# Patient Record
Sex: Female | Born: 1975 | Race: White | Hispanic: No | Marital: Single | Smoking: Never smoker
Health system: Southern US, Community
[De-identification: ages and names within clinical notes are randomized; demographics above are authoritative.]

## PROBLEM LIST (undated history)

## (undated) DIAGNOSIS — K760 Fatty (change of) liver, not elsewhere classified: Secondary | ICD-10-CM

## (undated) DIAGNOSIS — C801 Malignant (primary) neoplasm, unspecified: Secondary | ICD-10-CM

## (undated) DIAGNOSIS — M329 Systemic lupus erythematosus, unspecified: Secondary | ICD-10-CM

## (undated) DIAGNOSIS — R569 Unspecified convulsions: Secondary | ICD-10-CM

## (undated) DIAGNOSIS — M359 Systemic involvement of connective tissue, unspecified: Secondary | ICD-10-CM

## (undated) DIAGNOSIS — F10231 Alcohol dependence with withdrawal delirium: Secondary | ICD-10-CM

## (undated) DIAGNOSIS — N189 Chronic kidney disease, unspecified: Secondary | ICD-10-CM

## (undated) DIAGNOSIS — IMO0002 Reserved for concepts with insufficient information to code with codable children: Secondary | ICD-10-CM

## (undated) DIAGNOSIS — I7300 Raynaud's syndrome without gangrene: Secondary | ICD-10-CM

## (undated) DIAGNOSIS — F32A Depression, unspecified: Secondary | ICD-10-CM

## (undated) DIAGNOSIS — F1090 Alcohol use, unspecified, uncomplicated: Secondary | ICD-10-CM

## (undated) DIAGNOSIS — F329 Major depressive disorder, single episode, unspecified: Secondary | ICD-10-CM

## (undated) DIAGNOSIS — F10931 Alcohol use, unspecified with withdrawal delirium: Secondary | ICD-10-CM

## (undated) DIAGNOSIS — I10 Essential (primary) hypertension: Secondary | ICD-10-CM

## (undated) DIAGNOSIS — F419 Anxiety disorder, unspecified: Secondary | ICD-10-CM

## (undated) HISTORY — PX: BREAST ENHANCEMENT SURGERY: SHX7

---

## 2011-01-16 DIAGNOSIS — J45909 Unspecified asthma, uncomplicated: Secondary | ICD-10-CM | POA: Insufficient documentation

## 2011-01-16 DIAGNOSIS — F4321 Adjustment disorder with depressed mood: Secondary | ICD-10-CM | POA: Insufficient documentation

## 2011-01-16 DIAGNOSIS — IMO0002 Reserved for concepts with insufficient information to code with codable children: Secondary | ICD-10-CM | POA: Insufficient documentation

## 2011-01-16 DIAGNOSIS — F329 Major depressive disorder, single episode, unspecified: Secondary | ICD-10-CM | POA: Insufficient documentation

## 2011-10-27 DIAGNOSIS — Z9289 Personal history of other medical treatment: Secondary | ICD-10-CM | POA: Insufficient documentation

## 2013-03-03 DIAGNOSIS — C801 Malignant (primary) neoplasm, unspecified: Secondary | ICD-10-CM

## 2013-03-03 HISTORY — DX: Malignant (primary) neoplasm, unspecified: C80.1

## 2015-06-22 DIAGNOSIS — R748 Abnormal levels of other serum enzymes: Secondary | ICD-10-CM | POA: Insufficient documentation

## 2015-06-22 DIAGNOSIS — K760 Fatty (change of) liver, not elsewhere classified: Secondary | ICD-10-CM | POA: Insufficient documentation

## 2015-11-26 ENCOUNTER — Emergency Department
Admission: EM | Admit: 2015-11-26 | Discharge: 2015-11-27 | Disposition: A | Discharge status: Other Acute Inpatient Hospital | Payer: Self-pay | Attending: Emergency Medicine | Admitting: Emergency Medicine

## 2015-11-26 ENCOUNTER — Encounter: Payer: Self-pay | Admitting: *Deleted

## 2015-11-26 DIAGNOSIS — F329 Major depressive disorder, single episode, unspecified: Secondary | ICD-10-CM | POA: Insufficient documentation

## 2015-11-26 DIAGNOSIS — F1020 Alcohol dependence, uncomplicated: Secondary | ICD-10-CM

## 2015-11-26 DIAGNOSIS — R4586 Emotional lability: Secondary | ICD-10-CM

## 2015-11-26 DIAGNOSIS — F10120 Alcohol abuse with intoxication, uncomplicated: Secondary | ICD-10-CM | POA: Insufficient documentation

## 2015-11-26 DIAGNOSIS — F1994 Other psychoactive substance use, unspecified with psychoactive substance-induced mood disorder: Secondary | ICD-10-CM

## 2015-11-26 DIAGNOSIS — F10920 Alcohol use, unspecified with intoxication, uncomplicated: Secondary | ICD-10-CM

## 2015-11-26 DIAGNOSIS — R569 Unspecified convulsions: Secondary | ICD-10-CM

## 2015-11-26 DIAGNOSIS — E876 Hypokalemia: Secondary | ICD-10-CM

## 2015-11-26 DIAGNOSIS — I10 Essential (primary) hypertension: Secondary | ICD-10-CM | POA: Insufficient documentation

## 2015-11-26 DIAGNOSIS — M329 Systemic lupus erythematosus, unspecified: Secondary | ICD-10-CM

## 2015-11-26 DIAGNOSIS — F3489 Other specified persistent mood disorders: Secondary | ICD-10-CM | POA: Insufficient documentation

## 2015-11-26 HISTORY — DX: Unspecified convulsions: R56.9

## 2015-11-26 HISTORY — DX: Essential (primary) hypertension: I10

## 2015-11-26 HISTORY — DX: Anxiety disorder, unspecified: F41.9

## 2015-11-26 HISTORY — DX: Major depressive disorder, single episode, unspecified: F32.9

## 2015-11-26 HISTORY — DX: Systemic lupus erythematosus, unspecified: M32.9

## 2015-11-26 HISTORY — DX: Reserved for concepts with insufficient information to code with codable children: IMO0002

## 2015-11-26 HISTORY — DX: Depression, unspecified: F32.A

## 2015-11-26 LAB — COMPREHENSIVE METABOLIC PANEL
ALT: 119 U/L — ABNORMAL HIGH (ref 14–54)
ANION GAP: 10 (ref 5–15)
AST: 578 U/L — AB (ref 15–41)
Albumin: 4.2 g/dL (ref 3.5–5.0)
Alkaline Phosphatase: 118 U/L (ref 38–126)
BILIRUBIN TOTAL: 1.1 mg/dL (ref 0.3–1.2)
BUN: 9 mg/dL (ref 6–20)
CHLORIDE: 102 mmol/L (ref 101–111)
CO2: 26 mmol/L (ref 22–32)
Calcium: 8.4 mg/dL — ABNORMAL LOW (ref 8.9–10.3)
Creatinine, Ser: 0.66 mg/dL (ref 0.44–1.00)
Glucose, Bld: 155 mg/dL — ABNORMAL HIGH (ref 65–99)
POTASSIUM: 3.2 mmol/L — AB (ref 3.5–5.1)
Sodium: 138 mmol/L (ref 135–145)
TOTAL PROTEIN: 8.1 g/dL (ref 6.5–8.1)

## 2015-11-26 LAB — CBC
HCT: 43.4 % (ref 35.0–47.0)
Hemoglobin: 15.3 g/dL (ref 12.0–16.0)
MCH: 34.4 pg — ABNORMAL HIGH (ref 26.0–34.0)
MCHC: 35.2 g/dL (ref 32.0–36.0)
MCV: 97.6 fL (ref 80.0–100.0)
PLATELETS: 101 10*3/uL — AB (ref 150–440)
RBC: 4.45 MIL/uL (ref 3.80–5.20)
RDW: 13.1 % (ref 11.5–14.5)
WBC: 2.5 10*3/uL — AB (ref 3.6–11.0)

## 2015-11-26 LAB — ETHANOL: ALCOHOL ETHYL (B): 491 mg/dL — AB (ref <5)

## 2015-11-26 LAB — SALICYLATE LEVEL

## 2015-11-26 LAB — ACETAMINOPHEN LEVEL: Acetaminophen (Tylenol), Serum: <10 ug/mL — ABNORMAL LOW (ref 10–30)

## 2015-11-26 MED ORDER — POTASSIUM CHLORIDE CRYS ER 20 MEQ PO TBCR
40.0000 meq | EXTENDED_RELEASE_TABLET | Freq: Once | ORAL | Status: AC
  Administered 2015-11-26: 40 meq via ORAL
  Filled 2015-11-26: qty 2

## 2015-11-26 MED ORDER — LORAZEPAM 2 MG PO TABS: 0.0000 mg | ORAL_TABLET | Freq: Two times a day (BID) | ORAL | Status: DC

## 2015-11-26 MED ORDER — LORAZEPAM 2 MG PO TABS
0.0000 mg | ORAL_TABLET | Freq: Four times a day (QID) | ORAL | Status: DC
  Administered 2015-11-26: 2 mg via ORAL
  Filled 2015-11-26: qty 1

## 2015-11-26 MED ORDER — VITAMIN B-1 100 MG PO TABS
100.0000 mg | ORAL_TABLET | Freq: Every day | ORAL | Status: DC
  Administered 2015-11-26 – 2015-11-27 (×2): 100 mg via ORAL
  Filled 2015-11-26 (×3): qty 1

## 2015-11-26 MED ORDER — THIAMINE HCL 100 MG/ML IJ SOLN: 100.0000 mg | Freq: Every day | INTRAMUSCULAR | Status: DC

## 2015-11-26 NOTE — ED Notes (Signed)
PT  PUT  UNDER  IVC   PER   DR Mariea Clonts  INFORMED  RN  [facility]
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
## 2015-11-26 NOTE — ED Notes (Signed)
Pt came to doorway and reported she needed to go to the bathroom. Pt then walked back in the room as if to head to the toilet and fell on the ground. No injury noted and pt denies pain at this time. Sitter placed at bedside and safety mats placed around bed. Pt had monitor on to alarm if she attempts to leave bed.
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
## 2015-11-26 NOTE — ED Notes (Signed)
Spoke with Dr Karma Greaser regarding pt being more agitated and anxious at this time.  MD requested that CIWA be scored and pt be medicated per CIWA score.

## 2015-11-26 NOTE — ED Notes (Signed)
Notified Adele Barthel. Of ETOH 491 per lab

## 2015-11-26 NOTE — Consult Note (Signed)
[facility] Face-to-Face Psychiatry Consult   Reason for Consult:  Consult for this 40 year old woman brought into the emergency room by her family for multiple problem behaviors   lReferring Physician:  Karma Greaser Patient Identification: Chloe [name] MRN:  [id number] Principal Diagnosis: Substance induced mood disorder [facility]) Diagnosis:   Patient Active Problem List   Diagnosis Date Noted  . Alcohol abuse [F10.10] 11/26/2015  . Substance induced mood disorder ([place]) [F19.94] 11/26/2015  . Lupus ([place]) [M32.9] 11/26/2015  . Seizures ([place]) [R56.9] 11/26/2015    Total Time spent with patient: 1 hour  Subjective:   Chloe [name] is a 40 y.o. female patient admitted with  "I just lost my job".  HPI:  Patient interviewed. Chart reviewed. Spoke with her parents as well. This is a 37 year old woman brought by her family and to the emergency room with concerns about labile behavior, bizarre behavior, appearing to not take care of herself. There is commitment paperwork taken out by her boyfriend reporting that she has extreme mood swings, that she abuses prescription medicine and has been acting in an erratic manner. On interview the patient herself has no insight in particular into any kind of mental health problem. 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 Patient is grossly intoxicated and her history tends to be rambling and disorganized. She tells me that she has been having multiple seizures recently. This despite insisting that she is still taking her Keppra regularly. She says her sleep has been erratic recently. Her mood has been feeling up and down. She denies any suicidal or homicidal ideation. She says that she sometimes sees things but only when she is with her boyfriend. Patient did not bring up herself her own drinking. When I ask her how much she had been drinking recently she told me  that she hardly drinks at all and had only had one little drink in the last day. Patient has a blood alcohol level of 490 on presentation. This she said she did not feel intoxicated at all and had no insight into the fact that she been abusing alcohol. I ask her father whether they knew of any alcohol problem and he said that he did not know of 1 but wouldn't be surprised. Patient denies that she is abusing any other drugs. Says that she takes all of her other medicine as prescribed.  Social history: Patient reports she has a home by herself but has a long-term boyfriend she sees regularly. She seems to have a close relationship with her parents. 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 When I ask her why she lost her job all she would tell me is that she was "not communicating".  Medical history: Patient has a history of lupus. Also history of seizures that apparently just started about 10 years ago and have only accelerated recently. It doesn't sound like anyone has any suspicion that they could be related to her alcohol abuse although that would seem like the obvious thing tying everything together to me.  Substance abuse history: Patient insists that she does not abuse alcohol. She says that she only drinks alcohol when she is with her boyfriend. Denies that she abuses any other drugs. Never had any kind of substance abuse treatment in the past.  Past Psychiatric History:  Patient says she has  seen psychiatrists in the past. Can't be very specific about why. No history of inpatient hospitalization. No history of suicide attempts or violence.  Risk to Self: Suicidal Ideation: No Suicidal Intent: No Is patient at risk for suicide?: No Suicidal Plan?: No Access to Means: No What has been your use of drugs/alcohol within the last 12 months?: see labs Other Self Harm Risks: none known Intentional Self Injurious Behavior: None Risk to Others: Homicidal  Ideation: No Thoughts of Harm to Others: No Current Homicidal Intent: No Current Homicidal Plan: No Access to Homicidal Means: No History of harm to others?: Yes Assessment of Violence: In past 6-12 months Violent Behavior Description: fighting with BF Does patient have access to weapons?: No Criminal Charges Pending?: No Does patient have a court date: No Prior Inpatient Therapy: Prior Inpatient Therapy: No (3x, doesn't remember who) Prior Outpatient Therapy: Prior Outpatient Therapy: No Prior Therapy Dates:  (unk) Prior Therapy Facilty/Provider(s):  (unk) Reason for Treatment: depression Does patient have an ACCT team?: No Does patient have Intensive In-House Services?  : No Does patient have [facility] services? : No Does patient have P4CC services?: No  Past Medical History:  Past Medical History:  Diagnosis Date  . Anxiety   . Depression   . Hypertension   . Lupus ([place])   . Seizures ([place])     Past Surgical History:  Procedure Laterality Date  . BREAST ENHANCEMENT SURGERY     Family History: No family history on file. Family Psychiatric  History:  Not aware of any family history of mental health problems. Social History:  History  Alcohol Use  . Yes    Comment: occasional     History  Drug Use No    Social History   Social History  . Marital status: Single    Spouse name: N/A  . Number of children: N/A  . Years of education: N/A   Social History Main Topics  . Smoking status: Never Smoker  . Smokeless tobacco: Never Used  . Alcohol use Yes     Comment: occasional  . Drug use: No  . Sexual activity: Not Asked   Other Topics Concern  . None   Social History Narrative  . None   Additional Social History:    Allergies:  Not on File  Labs:  Results for orders placed or performed during the hospital encounter of 11/26/15 (from the past 48 hour(s))  Comprehensive metabolic panel     Status: Abnormal   Collection Time: 11/26/15 12:19 PM  Result  Value Ref Range   Sodium 138 135 - 145 mmol/L   Potassium 3.2 (L) 3.5 - 5.1 mmol/L   Chloride 102 101 - 111 mmol/L   CO2 26 22 - 32 mmol/L   Glucose, Bld 155 (H) 65 - 99 mg/dL   BUN 9 6 - 20 mg/dL   Creatinine, Ser 0.66 0.44 - 1.00 mg/dL   Calcium 8.4 (L) 8.9 - 10.3 mg/dL   Total Protein 8.1 6.5 - 8.1 g/dL   Albumin 4.2 3.5 - 5.0 g/dL   AST 578 (H) 15 - 41 U/L   ALT 119 (H) 14 - 54 U/L   Alkaline Phosphatase 118 38 - 126 U/L   Total Bilirubin 1.1 0.3 - 1.2 mg/dL   GFR calc non Af Amer >60 >60 mL/min   GFR calc Af Amer >60 >60 mL/min    Comment: (NOTE) The eGFR has been calculated using the CKD EPI equation. This calculation has not been validated  in all clinical situations. eGFR's persistently <60 mL/min signify possible Chronic Kidney Disease.    Anion gap 10 5 - 15  Ethanol     Status: Abnormal   Collection Time: 11/26/15 12:19 PM  Result Value Ref Range   Alcohol, Ethyl (B) 491 (HH) <5 mg/dL    Comment: CRITICAL RESULT CALLED TO, READ BACK BY AND VERIFIED WITH SHANNON HATCH ON 11/26/15 AT 1320 QSD        LOWEST DETECTABLE LIMIT FOR SERUM ALCOHOL IS 5 mg/dL FOR MEDICAL PURPOSES ONLY   Salicylate level     Status: None   Collection Time: 11/26/15 12:19 PM  Result Value Ref Range   Salicylate Lvl <2.3 2.8 - 30.0 mg/dL  Acetaminophen level     Status: Abnormal   Collection Time: 11/26/15 12:19 PM  Result Value Ref Range   Acetaminophen (Tylenol), Serum <10 (L) 10 - 30 ug/mL    Comment:        THERAPEUTIC CONCENTRATIONS VARY SIGNIFICANTLY. A RANGE OF 10-30 ug/mL MAY BE AN EFFECTIVE CONCENTRATION FOR MANY PATIENTS. HOWEVER, SOME ARE BEST TREATED AT CONCENTRATIONS OUTSIDE THIS RANGE. ACETAMINOPHEN CONCENTRATIONS >150 ug/mL AT 4 HOURS AFTER INGESTION AND >50 ug/mL AT 12 HOURS AFTER INGESTION ARE OFTEN ASSOCIATED WITH TOXIC REACTIONS.   cbc     Status: Abnormal   Collection Time: 11/26/15 12:19 PM  Result Value Ref Range   WBC 2.5 (L) 3.6 - 11.0 K/uL   RBC 4.45  3.80 - 5.20 MIL/uL   Hemoglobin 15.3 12.0 - 16.0 g/dL   HCT 43.4 35.0 - 47.0 %   MCV 97.6 80.0 - 100.0 fL   MCH 34.4 (H) 26.0 - 34.0 pg   MCHC 35.2 32.0 - 36.0 g/dL   RDW 13.1 11.5 - 14.5 %   Platelets 101 (L) 150 - 440 K/uL    Current Facility-Administered Medications  Medication Dose Route Frequency Provider Last Rate Last Dose  . LORazepam (ATIVAN) tablet 0-4 mg  0-4 mg Oral Q6H Hinda Kehr, MD       Followed by  . [START ON 11/28/2015] LORazepam (ATIVAN) tablet 0-4 mg  0-4 mg Oral Q12H Hinda Kehr, MD      . thiamine (VITAMIN B-1) tablet 100 mg  100 mg Oral Daily Hinda Kehr, MD   100 mg at 11/26/15 1645   Or  . thiamine (B-1) injection 100 mg  100 mg Intravenous Daily Hinda Kehr, MD       No current outpatient prescriptions on file.    Musculoskeletal: Strength & Muscle Tone: decreased Gait & Station: unsteady Patient leans: Front  Psychiatric Specialty Exam: Physical Exam  Nursing note and vitals reviewed. Constitutional: She appears well-developed and well-nourished.  HENT:  Head: Normocephalic and atraumatic.  Eyes: Conjunctivae are normal. Pupils are equal, round, and reactive to light.  Neck: Normal range of motion.  Cardiovascular: Normal heart sounds.   Respiratory: Effort normal. No respiratory distress.  GI: Soft.  Musculoskeletal: Normal range of motion.  Neurological: She is alert.  Skin: Skin is warm and dry.  Psychiatric: Her affect is labile and inappropriate. Her speech is delayed, tangential and slurred. Thought content is not paranoid and not delusional. She expresses impulsivity and inappropriate judgment. She expresses no homicidal and no suicidal ideation. She exhibits abnormal recent memory and abnormal remote memory. She is inattentive.    Review of Systems  Constitutional: Positive for malaise/fatigue.  HENT: Negative.   Eyes: Negative.   Respiratory: Negative.   Cardiovascular: Negative.   Gastrointestinal: Positive  for nausea.   Musculoskeletal: Negative.   Skin: Negative.   Neurological: Positive for dizziness, seizures and weakness.  Psychiatric/Behavioral: Positive for hallucinations, memory loss and substance abuse. Negative for depression and suicidal ideas. The patient is nervous/anxious and has insomnia.     Blood pressure (!) 149/108, pulse 97, temperature 98.4 F (36.9 C), temperature source Oral, resp. rate 18, height 5' 2"  (1.575 m), weight 59 kg (130 lb), last menstrual period 09/25/2015, SpO2 96 %.Body mass index is 23.78 kg/m.  General Appearance: Disheveled  Eye Contact:  Fair  Speech:  Garbled, Slow and Slurred  Volume:  Decreased  Mood:  Euthymic  Affect:  Inappropriate and Labile  Thought Process:  Disorganized  Orientation:  Negative  Thought Content:  Illogical, Rumination and Tangential  Suicidal Thoughts:  No  Homicidal Thoughts:  No  Memory:  Immediate;   Fair Recent;   Poor Remote;   Fair  Judgement:  Impaired  Insight:  Lacking  Psychomotor Activity:  Decreased  Concentration:  Concentration: Poor  Recall:  [employer] of Knowledge:  Fair  Language:  Fair  Akathisia:  No  Handed:  Right  AIMS (if indicated):     Assets:  Housing Social Support  ADL's:  Impaired  Cognition:  Impaired,  Mild  Sleep:        Treatment Plan Summary: Daily contact with patient to assess and evaluate symptoms and progress in treatment, Medication management and Plan 40 year old woman who presents to the emergency room very intoxicated although somehow this seems to of escape the attention of her family. Patient herself does not understand that she is intoxicated. She is confused and her short-term memory is very bad. Disorganized in her thinking. Sounds like she is probably having seizures at least in part related to alcohol withdrawal. No indication of suicidality or psychosis. Patient does not require inpatient psychiatric hospitalization. I have discussed the situation with nursing who is going  to watch her closely for a CIWA protocol. I will follow-up as needed.  Disposition: Patient does not meet criteria for psychiatric inpatient admission. Supportive therapy provided about ongoing stressors.  Alethia Berthold, MD 11/26/2015 6:45 PM

## 2015-11-26 NOTE — ED Provider Notes (Signed)
-----------------------------------------   10:37 PM on 11/26/2015 -----------------------------------------  A few minutes ago the patient was briefly unobserved extremity get out of bed and fell forward, initially thought to strike her face but most likely caught herself on her arms.  I examined her and she has no acute injuries to her face or head, no C-spine tenderness, full range of motion without any pain or tenderness of her head and neck, no epistaxis, no apparent dental injuries.  She is complaining of a minimal amount of pain to her left elbow but she has no tenderness to palpation and full range of motion of the left elbow.  There is no indication for any imaging at this time.  A bed alarm is being put in place and she is being observed carefully.   Hinda Kehr, MD 11/26/15 2238

## 2015-11-26 NOTE — ED Notes (Signed)
Patient is refusing to stay, becoming mildly belligerent, but then becomes overly apologetic. Patient's father has contacted the patient's mother who is going to the magistrate to take out IVC papers.

## 2015-11-26 NOTE — ED Notes (Signed)
PT family has left but they are aware of visitation policy.

## 2015-11-26 NOTE — ED Notes (Signed)
PT IVC PENDING CONSULT  

## 2015-11-27 ENCOUNTER — Inpatient Hospital Stay
Admission: RE | Admit: 2015-11-27 | Discharge: 2015-11-30 | DRG: 882 | Disposition: A | Discharge status: Home / Self Care | Payer: No Typology Code available for payment source | Source: Intra-hospital | Attending: Psychiatry | Admitting: Psychiatry

## 2015-11-27 DIAGNOSIS — F329 Major depressive disorder, single episode, unspecified: Secondary | ICD-10-CM | POA: Diagnosis present

## 2015-11-27 DIAGNOSIS — F10231 Alcohol dependence with withdrawal delirium: Secondary | ICD-10-CM | POA: Diagnosis present

## 2015-11-27 DIAGNOSIS — R569 Unspecified convulsions: Secondary | ICD-10-CM | POA: Diagnosis present

## 2015-11-27 DIAGNOSIS — Z598 Other problems related to housing and economic circumstances: Secondary | ICD-10-CM

## 2015-11-27 DIAGNOSIS — M329 Systemic lupus erythematosus, unspecified: Secondary | ICD-10-CM | POA: Diagnosis present

## 2015-11-27 DIAGNOSIS — E039 Hypothyroidism, unspecified: Secondary | ICD-10-CM | POA: Diagnosis present

## 2015-11-27 DIAGNOSIS — I10 Essential (primary) hypertension: Secondary | ICD-10-CM | POA: Diagnosis present

## 2015-11-27 DIAGNOSIS — Z818 Family history of other mental and behavioral disorders: Secondary | ICD-10-CM | POA: Diagnosis not present

## 2015-11-27 DIAGNOSIS — G4700 Insomnia, unspecified: Secondary | ICD-10-CM | POA: Diagnosis present

## 2015-11-27 DIAGNOSIS — F10931 Alcohol use, unspecified with withdrawal delirium: Secondary | ICD-10-CM | POA: Diagnosis present

## 2015-11-27 DIAGNOSIS — F4323 Adjustment disorder with mixed anxiety and depressed mood: Principal | ICD-10-CM

## 2015-11-27 DIAGNOSIS — G319 Degenerative disease of nervous system, unspecified: Secondary | ICD-10-CM | POA: Diagnosis present

## 2015-11-27 DIAGNOSIS — F418 Other specified anxiety disorders: Secondary | ICD-10-CM | POA: Diagnosis present

## 2015-11-27 DIAGNOSIS — F1994 Other psychoactive substance use, unspecified with psychoactive substance-induced mood disorder: Secondary | ICD-10-CM | POA: Diagnosis present

## 2015-11-27 DIAGNOSIS — Y908 Blood alcohol level of 240 mg/100 ml or more: Secondary | ICD-10-CM | POA: Diagnosis present

## 2015-11-27 DIAGNOSIS — F1020 Alcohol dependence, uncomplicated: Secondary | ICD-10-CM | POA: Diagnosis present

## 2015-11-27 MED ORDER — LEVETIRACETAM 500 MG PO TABS
500.0000 mg | ORAL_TABLET | Freq: Two times a day (BID) | ORAL | Status: DC
  Administered 2015-11-27 – 2015-11-30 (×6): 500 mg via ORAL
  Filled 2015-11-27 (×6): qty 1

## 2015-11-27 MED ORDER — ACETAMINOPHEN 325 MG PO TABS
650.0000 mg | ORAL_TABLET | Freq: Four times a day (QID) | ORAL | Status: DC | PRN
Start: 2015-11-27 — End: 2015-11-30
  Administered 2015-11-28 – 2015-11-30 (×3): 650 mg via ORAL
  Filled 2015-11-27 (×3): qty 2

## 2015-11-27 MED ORDER — LORAZEPAM 1 MG PO TABS
1.0000 mg | ORAL_TABLET | Freq: Four times a day (QID) | ORAL | Status: DC | PRN
  Administered 2015-11-27 – 2015-11-30 (×7): 1 mg via ORAL
  Filled 2015-11-27 (×8): qty 1

## 2015-11-27 MED ORDER — THIAMINE HCL 100 MG/ML IJ SOLN: 100.0000 mg | Freq: Every day | INTRAMUSCULAR | Status: DC

## 2015-11-27 MED ORDER — ALUM & MAG HYDROXIDE-SIMETH 200-200-20 MG/5ML PO SUSP: 30.0000 mL | ORAL | Status: DC | PRN

## 2015-11-27 MED ORDER — LORAZEPAM 2 MG/ML IJ SOLN: 1.0000 mg | Freq: Four times a day (QID) | INTRAMUSCULAR | Status: DC | PRN

## 2015-11-27 MED ORDER — QUETIAPINE FUMARATE ER 50 MG PO TB24: 50.0000 mg | ORAL_TABLET | Freq: Every day | ORAL | Status: DC

## 2015-11-27 MED ORDER — MAGNESIUM HYDROXIDE 400 MG/5ML PO SUSP: 30.0000 mL | Freq: Every day | ORAL | Status: DC | PRN

## 2015-11-27 MED ORDER — DULOXETINE HCL 30 MG PO CPEP
30.0000 mg | ORAL_CAPSULE | Freq: Every day | ORAL | Status: DC
  Administered 2015-11-27 – 2015-11-29 (×3): 30 mg via ORAL
  Filled 2015-11-27 (×3): qty 1

## 2015-11-27 MED ORDER — HYDROXYCHLOROQUINE SULFATE 200 MG PO TABS
200.0000 mg | ORAL_TABLET | Freq: Every day | ORAL | Status: DC
  Administered 2015-11-27 – 2015-11-30 (×4): 200 mg via ORAL
  Filled 2015-11-27 (×5): qty 1

## 2015-11-27 MED ORDER — ADULT MULTIVITAMIN W/MINERALS CH
1.0000 | ORAL_TABLET | Freq: Every day | ORAL | Status: DC
  Administered 2015-11-27 – 2015-11-30 (×2): 1 via ORAL
  Filled 2015-11-27 (×4): qty 1

## 2015-11-27 MED ORDER — VITAMIN B-1 100 MG PO TABS
100.0000 mg | ORAL_TABLET | Freq: Every day | ORAL | Status: DC
  Administered 2015-11-27 – 2015-11-30 (×4): 100 mg via ORAL
  Filled 2015-11-27 (×4): qty 1

## 2015-11-27 MED ORDER — FOLIC ACID 1 MG PO TABS
1.0000 mg | ORAL_TABLET | Freq: Every day | ORAL | Status: DC
  Administered 2015-11-27 – 2015-11-30 (×4): 1 mg via ORAL
  Filled 2015-11-27 (×4): qty 1

## 2015-11-27 MED ORDER — LEVOTHYROXINE SODIUM 25 MCG PO TABS
25.0000 ug | ORAL_TABLET | Freq: Every day | ORAL | Status: DC
  Administered 2015-11-28 – 2015-11-30 (×3): 25 ug via ORAL
  Filled 2015-11-27 (×3): qty 1

## 2015-11-27 NOTE — ED Notes (Signed)
Dr.Clapacs at bedside  

## 2015-11-27 NOTE — ED Notes (Signed)

## 2015-11-27 NOTE — ED Notes (Signed)
Pt walking around in room, requesting tooth brush. Pt alert/oriented at this time.

## 2015-11-27 NOTE — Progress Notes (Signed)
Patient woke up stating she felt weird and shaking. Visual tremors. Endorsed nausea. CIWA at an 8. BP elevated. Ativan 1mg  PRN administered.

## 2015-11-27 NOTE — BHH Counselor (Signed)
TTS spoke with [facility] RN to inquire unit of disposition for this pt to be admitted downstairs to unit. This RN informed TTS she would call back to provide a bed assignment.

## 2015-11-27 NOTE — ED Notes (Signed)
BEHAVIORAL HEALTH ROUNDING Patient sleeping: Yes.   Patient alert and oriented: not applicable Behavior appropriate: Yes.  ; If no, describe:  Nutrition and fluids offered: No Toileting and hygiene offered: No Sitter present: yes Law enforcement present: Yes ODS  

## 2015-11-27 NOTE — Tx Team (Signed)
Initial Treatment Plan 11/27/2015 4:02 PM [name] [id number]    PATIENT STRESSORS: Occupational concerns Substance abuse   PATIENT STRENGTHS: Average or above average intelligence Capable of independent living Communication skills Supportive family/friends Work skills   PATIENT IDENTIFIED PROBLEMS: Substance abuse mood disorder 11/27/2015  Seizures disorder 11/27/2015                   DISCHARGE CRITERIA:  Adequate post-discharge living arrangements Verbal commitment to aftercare and medication compliance Withdrawal symptoms are absent or subacute and managed without 24-hour nursing intervention  PRELIMINARY DISCHARGE PLAN: Attend aftercare/continuing care group Return to previous living arrangement  PATIENT/FAMILY INVOLVEMENT: This treatment plan has been presented to and reviewed with the patient, Chloe [name], and/or family member,   The patient and family have been given the opportunity to ask questions and make suggestions.  Merlene Morse, RN 11/27/2015, 4:02 PM

## 2015-11-27 NOTE — Progress Notes (Signed)
Patient pleasant and cooperative during admission assessment. Patient denies SI/HI at this time. Patient denies AVH. Patient informed of fall risk status, fall risk assessed "low" at this time. Patient oriented to unit/staff/room. Patient denies any questions/concerns at this time. Patient safe on unit with Q15 minute checks for safety. Skin assessment & body search done.No contraband found. 

## 2015-11-27 NOTE — ED Notes (Signed)
Lunch given to patient.

## 2015-11-27 NOTE — BHH Counselor (Signed)
TTS spoke with Meredith Mody, RN in [place].   Pt has bed assignment 303-A.   Admitting: Dr. Weber Cooks  Attending: Dr. Bary Leriche

## 2015-11-27 NOTE — ED Notes (Signed)
Breakfast was given to patrient

## 2015-11-27 NOTE — ED Notes (Signed)
Report to [place], [place]

## 2015-11-27 NOTE — ED Notes (Signed)
Patient belongings sent home with mother.

## 2015-11-27 NOTE — ED Notes (Addendum)
Pt on phone with father, given tray and sprite.

## 2015-11-27 NOTE — BHH Group Notes (Signed)
[place] LCSW Group Therapy   11/27/2015 1pm  Type of Therapy: Group Therapy   Participation Level: Pt invited but did not attend.   Participation Quality: Pt invited but did not attend.    Glorious Peach, MSW, LCSWA 11/27/2015, 2:18PM

## 2015-11-27 NOTE — ED Notes (Signed)
Pt escorted to lower level unit by ED tech Beverlee Nims and [employer].

## 2015-11-27 NOTE — ED Provider Notes (Signed)
-----------------------------------------   1:59 PM on 11/27/2015 -----------------------------------------  Patient has been seen and evaluate by psychiatry, and they'll admit to their service for further treatment.   Harvest Dark, MD 11/27/15 1359

## 2015-11-27 NOTE — ED Provider Notes (Signed)
-----------------------------------------   7:30 AM on 11/27/2015 -----------------------------------------   Blood pressure 104/77, pulse 72, temperature 98.4 F (36.9 C), temperature source Oral, resp. rate 15, height 5\' 2"  (1.575 m), weight 130 lb (59 kg), last menstrual period 09/25/2015, SpO2 94 %.  The patient had no acute events since last update.  Calm and cooperative at this time.  Disposition is pending Psychiatry/Behavioral Medicine team recommendations.     Loney Hering, MD 11/27/15 0730

## 2015-11-27 NOTE — BHH Suicide Risk Assessment (Signed)
[facility] Admission Suicide Risk Assessment   Nursing information obtained from:    Demographic factors:    Current Mental Status:    Loss Factors:    Historical Factors:    Risk Reduction Factors:     Total Time spent with patient: 1 hour Principal Problem: Adjustment disorder with mixed anxiety and depressed mood Diagnosis:   Patient Active Problem List   Diagnosis Date Noted  . Adjustment disorder with mixed anxiety and depressed mood [F43.23] 11/27/2015  . Alcohol use disorder, moderate, dependence ([place]) [F10.20] 11/26/2015  . Substance induced mood disorder ([place]) [F19.94] 11/26/2015  . Lupus ([place]) [M32.9] 11/26/2015  . Seizures ([place]) [R56.9] 11/26/2015   Subjective Data: Depression.  Continued Clinical Symptoms:  Alcohol Use Disorder Identification Test Final Score (AUDIT): 3 The "Alcohol Use Disorders Identification Test", Guidelines for Use in Primary Care, Second Edition.  World Pharmacologist [place]). Score between 0-7:  no or low risk or alcohol related problems. Score between 8-15:  moderate risk of alcohol related problems. Score between 16-19:  high risk of alcohol related problems. Score 20 or above:  warrants further diagnostic evaluation for alcohol dependence and treatment.   CLINICAL FACTORS:   Depression:   Comorbid alcohol abuse/dependence Impulsivity Alcohol/Substance Abuse/Dependencies Medical Diagnoses and Treatments/Surgeries   Musculoskeletal: Strength & Muscle Tone: within normal limits Gait & Station: normal Patient leans: N/A  Psychiatric Specialty Exam: Physical Exam  Nursing note and vitals reviewed.   Review of Systems  Psychiatric/Behavioral: Positive for depression and substance abuse. The patient is nervous/anxious.   All other systems reviewed and are negative.   Blood pressure (!) 140/103, pulse (!) 102, temperature 98.7 F (37.1 C), temperature source Oral, resp. rate 18, height 5' 2.5" (1.588 m), weight 56.2 kg (124 lb), last  menstrual period 09/25/2015, SpO2 98 %.Body mass index is 22.32 kg/m.  General Appearance: Casual  Eye Contact:  Good  Speech:  Clear and Coherent  Volume:  Normal  Mood:  Anxious  Affect:  Appropriate  Thought Process:  Goal Directed  Orientation:  Full (Time, Place, and Person)  Thought Content:  WDL  Suicidal Thoughts:  No  Homicidal Thoughts:  No  Memory:  Immediate;   Fair Recent;   Fair Remote;   Fair  Judgement:  Impaired  Insight:  Lacking  Psychomotor Activity:  Normal  Concentration:  Concentration: Fair and Attention Span: Fair  Recall:  [employer] of Knowledge:  Fair  Language:  Fair  Akathisia:  No  Handed:  Right  AIMS (if indicated):     Assets:  Communication Skills Desire for Improvement Housing Resilience Social Support Transportation  ADL's:  Intact  Cognition:  WNL  Sleep:         COGNITIVE FEATURES THAT CONTRIBUTE TO RISK:  None    SUICIDE RISK:   Minimal: No identifiable suicidal ideation.  Patients presenting with no risk factors but with morbid ruminations; may be classified as minimal risk based on the severity of the depressive symptoms   PLAN OF CARE: Hospital admission, medication management, substance abuse counseling, discharge planning.  Ms. Choma is a 40 year old female with history of lupus, seizures, and depression admitted for disorganized in the context of alcohol abuse.  1. Mood. The patient responded well to Cymbalta in the past.  2. Alcohol abuse. Blood alcohol level was 490 admission. The patient denies drinking recently. She got drunk on the day of admission. She does not believe that she treatment for substance abuse.  3. Lupus. She is  on Plaquenil.  4. Seizures. She is on Keppra.  5. Hypothyroidism. She is on Synthroid.  6. Alcohol abuse. She is on CIWA protocol.  7. Disposition. She will be discharged to home with family. Follow up with [facility].  I certify that inpatient services furnished can reasonably be  expected to improve the patient's condition.  Orson Slick, MD 11/27/2015, 5:08 PM

## 2015-11-27 NOTE — ED Notes (Signed)
BEHAVIORAL HEALTH ROUNDING  Patient sleeping: Yes Patient alert and oriented: Sleeping Behavior appropriate: Yes. ; If no, describe:  Nutrition and fluids offered: No, sleeping  Toileting and hygiene offered: No, sleeping  Sitter present: q15 minute observations and security monitoring  Law enforcement present: Yes ODS 

## 2015-11-27 NOTE — ED Notes (Signed)
Report from [place], Therapist, sports. Care assumed by this RN.

## 2015-11-27 NOTE — Consult Note (Signed)
[facility] Face-to-Face Psychiatry Consult   Reason for Consult:  Consult for this 40 year old woman brought into the emergency room by her family for multiple problem behaviors   lReferring Physician:  Karma Greaser Patient Identification: Chloe [name] MRN:  [id number] Principal Diagnosis: Substance induced mood disorder [facility]) Diagnosis:   Patient Active Problem List   Diagnosis Date Noted  . Alcohol abuse [F10.10] 11/26/2015  . Substance induced mood disorder ([place]) [F19.94] 11/26/2015  . Lupus ([place]) [M32.9] 11/26/2015  . Seizures ([place]) [R56.9] 11/26/2015    Total Time spent with patient: 20 minutes  Subjective:   Chloe [name] is a 40 y.o. female patient admitted with  "I just lost my job".  Follow-up Tuesday the 26th. Patient reevaluated. I also spoke to her boyfriend on the telephone with the patient's consent. On interview today the patient initially was a little confused about where she was but was able to resolve that rapidly but still not able to give a clear description of the reason for being in the hospital. She repeated her statements from yesterday that she did not think that she was drinking a significant amount. Denied any acute mood symptoms. Seem to still be a little evasive and possibly not very forthcoming about her recent history. She did specifically tell me that I should call her boyfriend. I spoke to him by telephone. He described events going back to July of this year with multiple detailed episodes of her having very rapid shifts in her mood and behavior with agitated confused behavior that would seem to calm out of nowhere, frequent episodes of confusion, multiple episodes of these supposedly "seizures" which by his description sound questionable for actual epileptic seizures.  HPI:  Patient interviewed. Chart reviewed. Spoke with her parents as well. This is a 40 year old woman brought by her family and to the emergency room with concerns about labile behavior, bizarre behavior,  appearing to not take care of herself. There is commitment paperwork taken out by her boyfriend reporting that she has extreme mood swings, that she abuses prescription medicine and has been acting in an erratic manner. On interview the patient herself has no insight in particular into any kind of mental health problem. 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 Patient is grossly intoxicated and her history tends to be rambling and disorganized. She tells me that she has been having multiple seizures recently. This despite insisting that she is still taking her Keppra regularly. She says her sleep has been erratic recently. Her mood has been feeling up and down. She denies any suicidal or homicidal ideation. She says that she sometimes sees things but only when she is with her boyfriend. Patient did not bring up herself her own drinking. When I ask her how much she had been drinking recently she told me that she hardly drinks at all and had only had one little drink in the last day. Patient has a blood alcohol level of 490 on presentation. This she said she did not feel intoxicated at all and had no insight into the fact that she been abusing alcohol. I ask her father whether they knew of any alcohol problem and he said that he did not know of 1 but wouldn't be surprised. Patient denies that she is abusing any other drugs. Says that she takes all of her other medicine as prescribed.  Social history:  Patient reports she has a home by herself but has a long-term boyfriend she sees regularly. She seems to have a close relationship with her parents. 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 When I ask her why she lost her job all she would tell me is that she was "not communicating".  Medical history: Patient has a history of lupus. Also history of  seizures that apparently just started about 10 years ago and have only accelerated recently. It doesn't sound like anyone has any suspicion that they could be related to her alcohol abuse although that would seem like the obvious thing tying everything together to me.  Substance abuse history: Patient insists that she does not abuse alcohol. She says that she only drinks alcohol when she is with her boyfriend. Denies that she abuses any other drugs. Never had any kind of substance abuse treatment in the past.  Past Psychiatric History:  Patient says she has seen psychiatrists in the past. Can't be very specific about why. No history of inpatient hospitalization. No history of suicide attempts or violence.  Risk to Self:   Risk to Others:   Prior Inpatient Therapy:   Prior Outpatient Therapy:    Past Medical History:  Past Medical History:  Diagnosis Date  . Anxiety   . Depression   . Hypertension   . Lupus ([place])   . Seizures ([place])     Past Surgical History:  Procedure Laterality Date  . BREAST ENHANCEMENT SURGERY     Family History: No family history on file. Family Psychiatric  History:  Not aware of any family history of mental health problems. Social History:  History  Alcohol Use  . Yes    Comment: occasional     History  Drug Use No    Social History   Social History  . Marital status: Single    Spouse name: N/A  . Number of children: N/A  . Years of education: N/A   Social History Main Topics  . Smoking status: Never Smoker  . Smokeless tobacco: Never Used  . Alcohol use Yes     Comment: occasional  . Drug use: No  . Sexual activity: Not on file   Other Topics Concern  . Not on file   Social History Narrative  . No narrative on file   Additional Social History:    Allergies:  Not on File  Labs:  Results for orders placed or performed during the hospital encounter of 11/26/15 (from the past 48 hour(s))  Comprehensive metabolic panel     Status:  Abnormal   Collection Time: 11/26/15 12:19 PM  Result Value Ref Range   Sodium 138 135 - 145 mmol/L   Potassium 3.2 (L) 3.5 - 5.1 mmol/L   Chloride 102 101 - 111 mmol/L   CO2 26 22 - 32 mmol/L   Glucose, Bld 155 (H) 65 - 99 mg/dL   BUN 9 6 - 20 mg/dL   Creatinine, Ser 0.66 0.44 - 1.00 mg/dL   Calcium 8.4 (L) 8.9 - 10.3 mg/dL   Total Protein 8.1 6.5 - 8.1 g/dL   Albumin 4.2 3.5 - 5.0 g/dL   AST 578 (H) 15 - 41 U/L   ALT 119 (H) 14 - 54 U/L   Alkaline Phosphatase 118 38 - 126 U/L   Total Bilirubin 1.1 0.3 - 1.2 mg/dL   GFR calc non Af Amer >60 >60 mL/min   GFR calc  Af Amer >60 >60 mL/min    Comment: (NOTE) The eGFR has been calculated using the CKD EPI equation. This calculation has not been validated in all clinical situations. eGFR's persistently <60 mL/min signify possible Chronic Kidney Disease.    Anion gap 10 5 - 15  Ethanol     Status: Abnormal   Collection Time: 11/26/15 12:19 PM  Result Value Ref Range   Alcohol, Ethyl (B) 491 (HH) <5 mg/dL    Comment: CRITICAL RESULT CALLED TO, READ BACK BY AND VERIFIED WITH SHANNON HATCH ON 11/26/15 AT 1320 QSD        LOWEST DETECTABLE LIMIT FOR SERUM ALCOHOL IS 5 mg/dL FOR MEDICAL PURPOSES ONLY   Salicylate level     Status: None   Collection Time: 11/26/15 12:19 PM  Result Value Ref Range   Salicylate Lvl <0.1 2.8 - 30.0 mg/dL  Acetaminophen level     Status: Abnormal   Collection Time: 11/26/15 12:19 PM  Result Value Ref Range   Acetaminophen (Tylenol), Serum <10 (L) 10 - 30 ug/mL    Comment:        THERAPEUTIC CONCENTRATIONS VARY SIGNIFICANTLY. A RANGE OF 10-30 ug/mL MAY BE AN EFFECTIVE CONCENTRATION FOR MANY PATIENTS. HOWEVER, SOME ARE BEST TREATED AT CONCENTRATIONS OUTSIDE THIS RANGE. ACETAMINOPHEN CONCENTRATIONS >150 ug/mL AT 4 HOURS AFTER INGESTION AND >50 ug/mL AT 12 HOURS AFTER INGESTION ARE OFTEN ASSOCIATED WITH TOXIC REACTIONS.   cbc     Status: Abnormal   Collection Time: 11/26/15 12:19 PM  Result  Value Ref Range   WBC 2.5 (L) 3.6 - 11.0 K/uL   RBC 4.45 3.80 - 5.20 MIL/uL   Hemoglobin 15.3 12.0 - 16.0 g/dL   HCT 43.4 35.0 - 47.0 %   MCV 97.6 80.0 - 100.0 fL   MCH 34.4 (H) 26.0 - 34.0 pg   MCHC 35.2 32.0 - 36.0 g/dL   RDW 13.1 11.5 - 14.5 %   Platelets 101 (L) 150 - 440 K/uL    No current facility-administered medications for this encounter.     Musculoskeletal: Strength & Muscle Tone: decreased Gait & Station: unsteady Patient leans: Front  Psychiatric Specialty Exam: Physical Exam  Nursing note and vitals reviewed. Constitutional: She appears well-developed and well-nourished.  HENT:  Head: Normocephalic and atraumatic.  Eyes: Conjunctivae are normal. Pupils are equal, round, and reactive to light.  Neck: Normal range of motion.  Cardiovascular: Normal heart sounds.   Respiratory: Effort normal. No respiratory distress.  GI: Soft.  Musculoskeletal: Normal range of motion.  Neurological: She is alert.  Skin: Skin is warm and dry.  Psychiatric: Her mood appears anxious. Her affect is not labile and not inappropriate. Her speech is delayed and tangential. Her speech is not slurred. She is slowed. Thought content is not paranoid and not delusional. She expresses impulsivity. She does not express inappropriate judgment. She expresses no homicidal and no suicidal ideation. She exhibits abnormal recent memory. She exhibits normal remote memory. She is attentive.    Review of Systems  Constitutional: Positive for malaise/fatigue.  HENT: Negative.   Eyes: Negative.   Respiratory: Negative.   Cardiovascular: Negative.   Gastrointestinal: Positive for nausea.  Musculoskeletal: Negative.   Skin: Negative.   Neurological: Positive for dizziness, seizures and weakness.  Psychiatric/Behavioral: Positive for hallucinations, memory loss and substance abuse. Negative for depression and suicidal ideas. The patient is nervous/anxious and has insomnia.     Last menstrual period  09/25/2015.There is no height or weight on file to calculate BMI.  General Appearance: Disheveled  Eye Contact:  Fair  Speech:  Slow  Volume:  Decreased  Mood:  Euthymic  Affect:  Congruent  Thought Process:  Disorganized  Orientation:  Negative  Thought Content:  Illogical, Rumination and Tangential  Suicidal Thoughts:  No  Homicidal Thoughts:  No  Memory:  Immediate;   Fair Recent;   Poor Remote;   Fair  Judgement:  Impaired  Insight:  Lacking  Psychomotor Activity:  Decreased  Concentration:  Concentration: Poor  Recall:  [employer] of Knowledge:  Fair  Language:  Fair  Akathisia:  No  Handed:  Right  AIMS (if indicated):     Assets:  Housing Social Support  ADL's:  Impaired  Cognition:  Impaired,  Mild  Sleep:        Treatment Plan Summary: Daily contact with patient to assess and evaluate symptoms and progress in treatment, Medication management and Plan 47 year old woman. Boyfriend and family are both describing a pretty clear history of erratic potentially dangerous behavior with rapid mood swings and bizarre behavior. It seems that there is been some consideration to this possibly being seizures although the kind of behavior the boyfriend is describing really sounds atypical for this being actual partial complex seizures. Instead I think the most likely situation is that the patient  has been drinking and asked Joslyn Devon doing a very good job of keeping the amount and frequency of her alcohol abuse hidden from her boyfriend and family. She is still not showing good insight into it in the interview today but is clearly causing major damage to her relationships and her health and her livelihood. Under those circumstances I sit and think it would be best to admit her to the hospital with a diagnosis of alcohol-induced mood disorder for further stabilization.   Disposition: Recommend psychiatric Inpatient admission when medically cleared. Supportive therapy provided about  ongoing stressors.  Alethia Berthold, MD 11/27/2015 3:23 PM

## 2015-11-27 NOTE — H&P (Signed)
Psychiatric Admission Assessment Adult  Patient Identification: Chloe [name] MRN:  [id number] Date of Evaluation:  11/27/2015 Chief Complaint:  substance induced mood Principal Diagnosis: Adjustment disorder with mixed anxiety and depressed mood Diagnosis:   Patient Active Problem List   Diagnosis Date Noted  . Adjustment disorder with mixed anxiety and depressed mood [F43.23] 11/27/2015  . Alcohol use disorder, moderate, dependence ([place]) [F10.20] 11/26/2015  . Substance induced mood disorder ([place]) [F19.94] 11/26/2015  . Lupus ([place]) [M32.9] 11/26/2015  . Seizures ([place]) [R56.9] 11/26/2015   History of Present Illness:   Identifying data. Chloe [name] is a 40 year old female with a remote history of depression.  Chief complaint. "It was either seeing my father's psychiatrist or coming here."  History of present illness. Information was obtained from and the chart. 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 She started experiencing financial difficulties and became somewhat depressed with sadness, disturbed sleep, decreased appetite, feeling of hopelessness and worthlessness. She was not interested in getting out of the house which she partly blames on frequent seizure episodes. Her father has been increasingly worried and gave the patient an ultimatum forcing her to come to the hospital. Upon arrival, the patient was not able to provide much information as she was driving with blood alcohol level of 490. Today she is pleasant polite and cooperative. She denies symptoms of severe depression or anxiety. She denies psychotic symptoms. She is not suicidal or homicidal. She denies  Heavy alcohol use but admits to drinking on the night of admission. She denies prescription pill or illicit substance abuse. The patient admits that she was surprised when she was fired from her job and does not good plan of what  to do next. She feels that her general health has been deteriorating. She has been experiencing frequent seizures. She is in the care of [facility]. She takes Keppra regularly. Her boyfriend recently shared some of her unusual behaviors, most likely seizures, on his cell phone camera.   Past psychiatric history. She never attempted suicide, was hospitalized, or in substance abuse treatment. She was treated with Cymbalta at some point with success. She was in therapy 3 times in her life including her divorces.  Family psychiatric history. Father with depression. 2 of her father's sister is with severe mental illness while with bipolar and another one institutionalized.  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 She just lost her job. She was fired.. She lost her insurance. She has a boyfriend who is very supportive. They've known each other for 5 years but have been dating for 11 months. She lives independently in [place]. She no longer be able to ford this type arangement. She cannot count on much support from her family.  Total Time spent with patient: 1 hour  Is the patient at risk to self? Yes.    Has the patient been a risk to self in the past 6 months? No.  Has the patient been a risk to self within the distant past? No.  Is the patient a risk to others? No.  Has the patient been a risk to others in the past 6 months? No.  Has the patient been a risk to others within the distant past? No.   Prior Inpatient Therapy:   Prior Outpatient Therapy:    Alcohol Screening: 1. How often do you have a drink containing alcohol?: 2  to 3 times a week 2. How many drinks containing alcohol do you have on a typical day when you are drinking?: 1 or 2 3. How often do you have six or more drinks on one occasion?: Never Preliminary Score: 0 4. How often during the last year have you found that you were not able to stop drinking once you had started?: Never 5. How often during the last  year have you failed to do what was normally expected from you becasue of drinking?: Never 6. How often during the last year have you needed a first drink in the morning to get yourself going after a heavy drinking session?: Never 7. How often during the last year have you had a feeling of guilt of remorse after drinking?: Never 8. How often during the last year have you been unable to remember what happened the night before because you had been drinking?: Never 9. Have you or someone else been injured as a result of your drinking?: No 10. Has a relative or friend or a doctor or another health worker been concerned about your drinking or suggested you cut down?: No Alcohol Use Disorder Identification Test Final Score (AUDIT): 3 Brief Intervention: AUDIT score less than 7 or less-screening does not suggest unhealthy drinking-brief intervention not indicated Substance Abuse History in the last 12 months:  Yes.   Consequences of Substance Abuse: Negative Previous Psychotropic Medications: No  Psychological Evaluations: No  Past Medical History:  Past Medical History:  Diagnosis Date  . Anxiety   . Depression   . Hypertension   . Lupus ([place])   . Seizures ([place])     Past Surgical History:  Procedure Laterality Date  . BREAST ENHANCEMENT SURGERY     Family History: History reviewed. No pertinent family history.  Tobacco Screening: Have you used any form of tobacco in the last 30 days? (Cigarettes, Smokeless Tobacco, Cigars, and/or Pipes): No Social History:  History  Alcohol Use  . Yes    Comment: occasional     History  Drug Use No    Additional Social History:                           Allergies:  Not on File Lab Results:  Results for orders placed or performed during the hospital encounter of 11/26/15 (from the past 48 hour(s))  Comprehensive metabolic panel     Status: Abnormal   Collection Time: 11/26/15 12:19 PM  Result Value Ref Range   Sodium 138 135 - 145  mmol/L   Potassium 3.2 (L) 3.5 - 5.1 mmol/L   Chloride 102 101 - 111 mmol/L   CO2 26 22 - 32 mmol/L   Glucose, Bld 155 (H) 65 - 99 mg/dL   BUN 9 6 - 20 mg/dL   Creatinine, Ser 0.66 0.44 - 1.00 mg/dL   Calcium 8.4 (L) 8.9 - 10.3 mg/dL   Total Protein 8.1 6.5 - 8.1 g/dL   Albumin 4.2 3.5 - 5.0 g/dL   AST 578 (H) 15 - 41 U/L   ALT 119 (H) 14 - 54 U/L   Alkaline Phosphatase 118 38 - 126 U/L   Total Bilirubin 1.1 0.3 - 1.2 mg/dL   GFR calc non Af Amer >60 >60 mL/min   GFR calc Af Amer >60 >60 mL/min    Comment: (NOTE) The eGFR has been calculated using the CKD EPI equation. This calculation has not been validated in all clinical situations. eGFR's  persistently <60 mL/min signify possible Chronic Kidney Disease.    Anion gap 10 5 - 15  Ethanol     Status: Abnormal   Collection Time: 11/26/15 12:19 PM  Result Value Ref Range   Alcohol, Ethyl (B) 491 (HH) <5 mg/dL    Comment: CRITICAL RESULT CALLED TO, READ BACK BY AND VERIFIED WITH SHANNON HATCH ON 11/26/15 AT 1320 QSD        LOWEST DETECTABLE LIMIT FOR SERUM ALCOHOL IS 5 mg/dL FOR MEDICAL PURPOSES ONLY   Salicylate level     Status: None   Collection Time: 11/26/15 12:19 PM  Result Value Ref Range   Salicylate Lvl <6.6 2.8 - 30.0 mg/dL  Acetaminophen level     Status: Abnormal   Collection Time: 11/26/15 12:19 PM  Result Value Ref Range   Acetaminophen (Tylenol), Serum <10 (L) 10 - 30 ug/mL    Comment:        THERAPEUTIC CONCENTRATIONS VARY SIGNIFICANTLY. A RANGE OF 10-30 ug/mL MAY BE AN EFFECTIVE CONCENTRATION FOR MANY PATIENTS. HOWEVER, SOME ARE BEST TREATED AT CONCENTRATIONS OUTSIDE THIS RANGE. ACETAMINOPHEN CONCENTRATIONS >150 ug/mL AT 4 HOURS AFTER INGESTION AND >50 ug/mL AT 12 HOURS AFTER INGESTION ARE OFTEN ASSOCIATED WITH TOXIC REACTIONS.   cbc     Status: Abnormal   Collection Time: 11/26/15 12:19 PM  Result Value Ref Range   WBC 2.5 (L) 3.6 - 11.0 K/uL   RBC 4.45 3.80 - 5.20 MIL/uL   Hemoglobin 15.3  12.0 - 16.0 g/dL   HCT 43.4 35.0 - 47.0 %   MCV 97.6 80.0 - 100.0 fL   MCH 34.4 (H) 26.0 - 34.0 pg   MCHC 35.2 32.0 - 36.0 g/dL   RDW 13.1 11.5 - 14.5 %   Platelets 101 (L) 150 - 440 K/uL    Blood Alcohol level:  Lab Results  Component Value Date   ETH 491 (HH) 44/05/4740    Metabolic Disorder Labs:  No results found for: HGBA1C, MPG No results found for: PROLACTIN No results found for: CHOL, TRIG, HDL, CHOLHDL, VLDL, LDLCALC  Current Medications: Current Facility-Administered Medications  Medication Dose Route Frequency Provider Last Rate Last Dose  . acetaminophen (TYLENOL) tablet 650 mg  650 mg Oral Q6H PRN Gonzella Lex, MD      . alum & mag hydroxide-simeth (MAALOX/MYLANTA) 200-200-20 MG/5ML suspension 30 mL  30 mL Oral Q4H PRN Gonzella Lex, MD      . DULoxetine (CYMBALTA) DR capsule 30 mg  30 mg Oral Daily Andrw Mcguirt B Monte Zinni, MD      . folic acid (FOLVITE) tablet 1 mg  1 mg Oral Daily Gonzella Lex, MD   1 mg at 11/27/15 1658  . hydroxychloroquine (PLAQUENIL) tablet 200 mg  200 mg Oral Daily Gonzella Lex, MD   200 mg at 11/27/15 1743  . levETIRAcetam (KEPPRA) tablet 500 mg  500 mg Oral BID Gonzella Lex, MD   500 mg at 11/27/15 1658  . [START ON 11/28/2015] levothyroxine (SYNTHROID, LEVOTHROID) tablet 25 mcg  25 mcg Oral QAC breakfast Gonzella Lex, MD      . LORazepam (ATIVAN) tablet 1 mg  1 mg Oral Q6H PRN Gonzella Lex, MD      . magnesium hydroxide (MILK OF MAGNESIA) suspension 30 mL  30 mL Oral Daily PRN Gonzella Lex, MD      . multivitamin with minerals tablet 1 tablet  1 tablet Oral Daily Gonzella Lex, MD   1 tablet  at 11/27/15 1658  . thiamine (VITAMIN B-1) tablet 100 mg  100 mg Oral Daily Gonzella Lex, MD       Or  . thiamine (B-1) injection 100 mg  100 mg Intravenous Daily Gonzella Lex, MD       PTA Medications: No prescriptions prior to admission.    Musculoskeletal: Strength & Muscle Tone: within normal limits Gait & Station:  normal Patient leans: N/A  Psychiatric Specialty Exam: Physical Exam  Nursing note and vitals reviewed. Constitutional: She is oriented to person, place, and time. She appears well-developed and well-nourished.  HENT:  Head: Normocephalic and atraumatic.  Eyes: Conjunctivae and EOM are normal. Pupils are equal, round, and reactive to light.  Neck: Normal range of motion. Neck supple.  Cardiovascular: Normal rate, regular rhythm and normal heart sounds.   Respiratory: Effort normal and breath sounds normal.  GI: Soft. Bowel sounds are normal.  Musculoskeletal: Normal range of motion.  Neurological: She is alert and oriented to person, place, and time.  Skin: Skin is warm and dry.    Review of Systems  Psychiatric/Behavioral: Positive for depression and substance abuse.  All other systems reviewed and are negative.   Blood pressure (!) 140/103, pulse (!) 102, temperature 98.7 F (37.1 C), temperature source Oral, resp. rate 18, height 5' 2.5" (1.588 m), weight 56.2 kg (124 lb), last menstrual period 09/25/2015, SpO2 98 %.Body mass index is 22.32 kg/m.  See SRA.                                                      Treatment Plan Summary: Daily contact with patient to assess and evaluate symptoms and progress in treatment and Medication management   Ms. Tout is a 40 year old female with history of lupus, seizures, and depression admitted for disorganized in the context of alcohol abuse.  1. Mood. The patient responded well to Cymbalta in the past.  2. Alcohol abuse. Blood alcohol level was 490 admission. The patient denies drinking recently. She got drunk on the day of admission. She does not believe that she treatment for substance abuse.  3. Lupus. She is on Plaquenil.  4. Seizures. She is on Keppra.  5. Hypothyroidism. She is on Synthroid.  6. Alcohol abuse. She is on CIWA protocol.  7. Disposition. She will be discharged to home with family.  Follow up with [facility].   Observation Level/Precautions:  15 minute checks  Laboratory:  CBC Chemistry Profile UDS UA  Psychotherapy:    Medications:    Consultations:    Discharge Concerns:    Estimated LOS:  Other:     Physician Treatment Plan for Primary Diagnosis: Adjustment disorder with mixed anxiety and depressed mood Long Term Goal(s): Improvement in symptoms so as ready for discharge  Short Term Goals: Ability to identify changes in lifestyle to reduce recurrence of condition will improve, Ability to verbalize feelings will improve, Ability to disclose and discuss suicidal ideas, Ability to demonstrate self-control will improve, Ability to identify and develop effective coping behaviors will improve and Ability to maintain clinical measurements within normal limits will improve  Physician Treatment Plan for Secondary Diagnosis: Principal Problem:   Adjustment disorder with mixed anxiety and depressed mood Active Problems:   Alcohol use disorder, moderate, dependence ([place])   Substance induced mood disorder ([place])   Lupus ([place])  Seizures ([place])  Long Term Goal(s): Improvement in symptoms so as ready for discharge  Short Term Goals: Ability to identify changes in lifestyle to reduce recurrence of condition will improve, Ability to demonstrate self-control will improve and Ability to identify triggers associated with substance abuse/mental health issues will improve  I certify that inpatient services furnished can reasonably be expected to improve the patient's condition.    Orson Slick, MD 9/26/20176:17 PM

## 2015-11-28 LAB — LIPID PANEL
Cholesterol: 256 mg/dL — ABNORMAL HIGH (ref 0–200)
HDL: 78 mg/dL (ref >40)
LDL CALC: 154 mg/dL — AB (ref 0–99)
TRIGLYCERIDES: 119 mg/dL (ref <150)
Total CHOL/HDL Ratio: 3.3 RATIO
VLDL: 24 mg/dL (ref 0–40)

## 2015-11-28 LAB — TSH: TSH: 3.026 u[IU]/mL (ref 0.350–4.500)

## 2015-11-28 MED ORDER — LORAZEPAM 2 MG PO TABS
2.0000 mg | ORAL_TABLET | Freq: Once | ORAL | Status: AC
  Administered 2015-11-28: 2 mg via ORAL
  Filled 2015-11-28: qty 1

## 2015-11-28 MED ORDER — CHLORDIAZEPOXIDE HCL 25 MG PO CAPS
25.0000 mg | ORAL_CAPSULE | Freq: Four times a day (QID) | ORAL | Status: DC
  Administered 2015-11-28 – 2015-11-29 (×6): 25 mg via ORAL
  Filled 2015-11-28 (×7): qty 1

## 2015-11-28 MED ORDER — PROMETHAZINE HCL 25 MG PO TABS
12.5000 mg | ORAL_TABLET | ORAL | Status: DC | PRN
  Administered 2015-11-28: 12.5 mg via ORAL
  Filled 2015-11-28: qty 1

## 2015-11-28 MED ORDER — LORAZEPAM 1 MG PO TABS
1.0000 mg | ORAL_TABLET | Freq: Once | ORAL | Status: AC
  Administered 2015-11-28: 1 mg via ORAL
  Filled 2015-11-28: qty 1

## 2015-11-28 NOTE — Progress Notes (Signed)
Recreation Therapy Notes  Date: 09.27.17 Time: 9:30 am Location: Craft Room  Group Topic: Self-esteem  Goal Area(s) Addresses:  Patient will write positive traits about self. Patient will verbalize benefit of having a healthy self-esteem.  Behavioral Response: Did not attend group.  Intervention: I Am  Activity: Patients were given a worksheet with the letter I on it and were instructed to write as many positive traits about themselves inside the letter.  Education: LRT educated patients on ways they can increase their self-esteem.  Education Outcome: Patient did not attend group.  Clinical Observations/Feedback: Patient did not attend group.  Leonette Monarch, LRT/CTRS 11/28/2015 10:19 AM

## 2015-11-28 NOTE — Progress Notes (Signed)
Pt awake and alert today. Spent most of the day in bed, but comes out and appropriately interacts with staff/peers at mealtime. Cooperative. Does not attend group. Tremors noted to be less as the day passed. BP remains elevated. Denies SI/HI/AVH. Reports stressors are having to move out of her apartment due to job/income loss. Recently diagnosed with Lupus. Did not complete hygiene ADLs today, but reports when her father brings belongings (clothing and makeup), she plans to shower and change into her own clothing.   Support and encouragement provided. Medications administered to include Librium and ativan. Every 15 minute checks for safety. Will continue to monitor.

## 2015-11-28 NOTE — Tx Team (Signed)
Interdisciplinary Treatment and Diagnostic Plan Update  11/28/2015 Time of Session:10:30am  Tayler Neeman MRN: [id number]  Principal Diagnosis: Adjustment disorder with mixed anxiety and depressed mood  Secondary Diagnoses: Principal Problem:   Adjustment disorder with mixed anxiety and depressed mood Active Problems:   Alcohol use disorder, moderate, dependence ([facility])   Substance induced mood disorder ([facility])   Lupus ([facility])   Seizures ([facility])   Current Medications:  Current Facility-Administered Medications  Medication Dose Route Frequency Provider Last Rate Last Dose  . acetaminophen (TYLENOL) tablet 650 mg  650 mg Oral Q6H PRN Gonzella Lex, MD   650 mg at 11/28/15 0718  . alum & mag hydroxide-simeth (MAALOX/MYLANTA) 200-200-20 MG/5ML suspension 30 mL  30 mL Oral Q4H PRN Gonzella Lex, MD      . chlordiazePOXIDE (LIBRIUM) capsule 25 mg  25 mg Oral QID Clovis Fredrickson, MD   25 mg at 11/28/15 1606  . DULoxetine (CYMBALTA) DR capsule 30 mg  30 mg Oral Daily Jolanta B Pucilowska, MD   30 mg at 11/28/15 0849  . folic acid (FOLVITE) tablet 1 mg  1 mg Oral Daily Gonzella Lex, MD   1 mg at 11/28/15 0849  . hydroxychloroquine (PLAQUENIL) tablet 200 mg  200 mg Oral Daily Gonzella Lex, MD   200 mg at 11/28/15 0850  . levETIRAcetam (KEPPRA) tablet 500 mg  500 mg Oral BID Gonzella Lex, MD   500 mg at 11/28/15 1606  . levothyroxine (SYNTHROID, LEVOTHROID) tablet 25 mcg  25 mcg Oral QAC breakfast Gonzella Lex, MD   25 mcg at 11/28/15 [phone]  . LORazepam (ATIVAN) tablet 1 mg  1 mg Oral Q6H PRN Gonzella Lex, MD   1 mg at 11/28/15 1606  . magnesium hydroxide (MILK OF MAGNESIA) suspension 30 mL  30 mL Oral Daily PRN Gonzella Lex, MD      . multivitamin with minerals tablet 1 tablet  1 tablet Oral Daily Gonzella Lex, MD   1 tablet at 11/27/15 1658  . promethazine (PHENERGAN) tablet 12.5 mg  12.5 mg Oral Q4H PRN Gonzella Lex, MD   12.5 mg at 11/28/15 0037  . thiamine (VITAMIN B-1) tablet  100 mg  100 mg Oral Daily Gonzella Lex, MD   100 mg at 11/28/15 0849   Or  . thiamine (B-1) injection 100 mg  100 mg Intravenous Daily Gonzella Lex, MD       PTA Medications: No prescriptions prior to admission.    Patient Stressors: Occupational concerns Substance abuse  Patient Strengths: Average or above average intelligence Capable of independent living Communication skills Supportive family/friends Work skills  Treatment Modalities: Medication Management, Group therapy, Case management,  1 to 1 session with clinician, Psychoeducation, Recreational therapy.   Physician Treatment Plan for Primary Diagnosis: Adjustment disorder with mixed anxiety and depressed mood Long Term Goal(s): Improvement in symptoms so as ready for discharge Improvement in symptoms so as ready for discharge   Short Term Goals: Ability to identify changes in lifestyle to reduce recurrence of condition will improve Ability to verbalize feelings will improve Ability to disclose and discuss suicidal ideas Ability to demonstrate self-control will improve Ability to identify and develop effective coping behaviors will improve Ability to maintain clinical measurements within normal limits will improve Ability to identify changes in lifestyle to reduce recurrence of condition will improve Ability to demonstrate self-control will improve Ability to identify triggers associated with substance abuse/mental health issues will improve  Medication Management: Evaluate patient's response, side effects, and tolerance of medication regimen.  Therapeutic Interventions: 1 to 1 sessions, Unit Group sessions and Medication administration.  Evaluation of Outcomes: Progressing  Physician Treatment Plan for Secondary Diagnosis: Principal Problem:   Adjustment disorder with mixed anxiety and depressed mood Active Problems:   Alcohol use disorder, moderate, dependence ([facility])   Substance induced mood disorder ([facility])    Lupus ([facility])   Seizures ([facility])  Long Term Goal(s): Improvement in symptoms so as ready for discharge Improvement in symptoms so as ready for discharge   Short Term Goals: Ability to identify changes in lifestyle to reduce recurrence of condition will improve Ability to verbalize feelings will improve Ability to disclose and discuss suicidal ideas Ability to demonstrate self-control will improve Ability to identify and develop effective coping behaviors will improve Ability to maintain clinical measurements within normal limits will improve Ability to identify changes in lifestyle to reduce recurrence of condition will improve Ability to demonstrate self-control will improve Ability to identify triggers associated with substance abuse/mental health issues will improve     Medication Management: Evaluate patient's response, side effects, and tolerance of medication regimen.  Therapeutic Interventions: 1 to 1 sessions, Unit Group sessions and Medication administration.  Evaluation of Outcomes: Progressing   RN Treatment Plan for Primary Diagnosis: Adjustment disorder with mixed anxiety and depressed mood Long Term Goal(s): Knowledge of disease and therapeutic regimen to maintain health will improve  Short Term Goals: Ability to verbalize feelings will improve, Ability to disclose and discuss suicidal ideas and Ability to identify and develop effective coping behaviors will improve  Medication Management: RN will administer medications as ordered by provider, will assess and evaluate patient's response and provide education to patient for prescribed medication. RN will report any adverse and/or side effects to prescribing provider.  Therapeutic Interventions: 1 on 1 counseling sessions, Psychoeducation, Medication administration, Evaluate responses to treatment, Monitor vital signs and CBGs as ordered, Perform/monitor CIWA, COWS, AIMS and Fall Risk screenings as ordered, Perform wound care  treatments as ordered.  Evaluation of Outcomes: Progressing   LCSW Treatment Plan for Primary Diagnosis: Adjustment disorder with mixed anxiety and depressed mood Long Term Goal(s): Safe transition to appropriate next level of care at discharge, Engage patient in therapeutic group addressing interpersonal concerns.  Short Term Goals: Engage patient in aftercare planning with referrals and resources, Increase ability to appropriately verbalize feelings, Facilitate acceptance of mental health diagnosis and concerns, Facilitate patient progression through stages of change regarding substance use diagnoses and concerns, Identify triggers associated with mental health/substance abuse issues and Increase skills for wellness and recovery  Therapeutic Interventions: Assess for all discharge needs, 1 to 1 time with Social worker, Explore available resources and support systems, Assess for adequacy in community support network, Educate family and significant other(s) on suicide prevention, Complete Psychosocial Assessment, Interpersonal group therapy.  Evaluation of Outcomes: Progressing   Progress in Treatment: Attending groups: Yes. Participating in groups: Yes. Taking medication as prescribed: Yes. Toleration medication: Yes. Family/Significant other contact made: Yes, individual(s) contacted:  Dad, Boyfriend and Sister Patient understands diagnosis: Yes. Discussing patient identified problems/goals with staff: Yes. Medical problems stabilized or resolved: Yes. Denies suicidal/homicidal ideation: Yes. Issues/concerns per patient self-inventory: Yes. Other:   New problem(s) identified: No, Describe:     New Short Term/Long Term Goal(s):  Discharge Plan or Barriers: Discharge home with sister in [place], follow up with [facility] and referral to [facility] program  Reason for Continuation  of Hospitalization: Anxiety Depression Medication  stabilization Withdrawal symptoms  Estimated Length of Stay:1-2 days  Attendees: Patient:Chloe [name] 11/28/2015 5:22 PM  Physician: Orson Slick 11/28/2015 5:22 PM  Nursing: Anderson Malta RN 11/28/2015 5:22 PM  RN Care Manager: 11/28/2015 5:22 PM  Social Worker: Dossie Arbour, LCSW 11/28/2015 5:22 PM  Recreational Therapist: Everitt Amber, LRT 11/28/2015 5:22 PM  Other:  11/28/2015 5:22 PM  Other:  11/28/2015 5:22 PM  Other: 11/28/2015 5:22 PM    Scribe for Treatment Team: August Saucer, LCSW 11/28/2015 5:22 PM
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
## 2015-11-28 NOTE — Progress Notes (Signed)
Patient with elevated temp, body shakes and tremors. Ativan 1mg  PRN given and Tylenol. MD paged.

## 2015-11-28 NOTE — BHH Group Notes (Signed)
[place] LCSW Group Therapy   11/28/2015  1:00 pm   Type of Therapy: Group Therapy   Participation Level: Pt invited but did not attend.   Participation Quality: Pt invited but did not attend.    Glorious Peach, MSW, LCSWA 11/28/2015, 1:55PM

## 2015-11-28 NOTE — Progress Notes (Signed)
D: Patient has been anxious throughout the night. Facial expression scared. Denies SI/HI/AVH. BP remains stable but slightly elevated. Reports nausea and visible tremors.  A: Medications given with education. Encouragement provided. MD notified. PRN Phenergan given with Ativan 1mg  2x.  R: Patient compliant with medication. She has remained calm and cooperative. Safety maintained with 15 min checks.

## 2015-11-28 NOTE — Plan of Care (Signed)
Problem: Physical Regulation: Goal: Complications related to the disease process, condition or treatment will be avoided or minimized Outcome: Not Progressing Pt appears and reports feeling much better this evening. Tremors less. BP continues to be elevated. Oral temp stable. Librium and ativan as ordered. Will continue to monitor.

## 2015-11-28 NOTE — Progress Notes (Signed)
[facility] MD Progress Note  11/28/2015 12:45 PM Chloe [name]  MRN:  [id number]  Subjective:  Chloe [name] looks pretty shaky today in spite of several doses of Ativan that she received last night. She still complains that alcohol is not a problem. 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 She reports mild symptoms of depression but is not suicidal. I reviewed the tremor there are no somatic complaints. Her hygiene is still questionable. She has not been participating in programming as of yet.  Principal Problem: Adjustment disorder with mixed anxiety and depressed mood Diagnosis:   Patient Active Problem List   Diagnosis Date Noted  . Adjustment disorder with mixed anxiety and depressed mood [F43.23] 11/27/2015  . Alcohol use disorder, moderate, dependence ([place]) [F10.20] 11/26/2015  . Substance induced mood disorder ([place]) [F19.94] 11/26/2015  . Lupus ([place]) [M32.9] 11/26/2015  . Seizures ([place]) [R56.9] 11/26/2015   Total Time spent with patient: 20 minutes  Past Psychiatric History: Depression, alcoholism.  Past Medical History:  Past Medical History:  Diagnosis Date  . Anxiety   . Depression   . Hypertension   . Lupus ([place])   . Seizures ([place])     Past Surgical History:  Procedure Laterality Date  . BREAST ENHANCEMENT SURGERY     Family History: History reviewed. No pertinent family history. Family Psychiatric  History: Depression. Social History:  History  Alcohol Use  . Yes    Comment: occasional     History  Drug Use No    Social History   Social History  . Marital status: Single    Spouse name: N/A  . Number of children: N/A  . Years of education: N/A   Social History Main Topics  . Smoking status: Never Smoker  . Smokeless tobacco: Never Used  . Alcohol use Yes      Comment: occasional  . Drug use: No  . Sexual activity: Not Asked   Other Topics Concern  . None   Social History Narrative  . None   Additional Social History:                         Sleep: Fair  Appetite:  Fair  Current Medications: Current Facility-Administered Medications  Medication Dose Route Frequency Provider Last Rate Last Dose  . acetaminophen (TYLENOL) tablet 650 mg  650 mg Oral Q6H PRN Gonzella Lex, MD   650 mg at 11/28/15 0718  . alum & mag hydroxide-simeth (MAALOX/MYLANTA) 200-200-20 MG/5ML suspension 30 mL  30 mL Oral Q4H PRN Gonzella Lex, MD      . chlordiazePOXIDE (LIBRIUM) capsule 25 mg  25 mg Oral QID Damiel Barthold B Takya Vandivier, MD      . DULoxetine (CYMBALTA) DR capsule 30 mg  30 mg Oral Daily Krisna Omar B Trevis Eden, MD   30 mg at 11/28/15 0849  . folic acid (FOLVITE) tablet 1 mg  1 mg Oral Daily Gonzella Lex, MD   1 mg at 11/28/15 0849  . hydroxychloroquine (PLAQUENIL) tablet 200 mg  200 mg Oral Daily Gonzella Lex, MD   200 mg at 11/28/15 0850  . levETIRAcetam (KEPPRA) tablet 500 mg  500 mg Oral BID Gonzella Lex, MD   500 mg at 11/28/15 0849  . levothyroxine (SYNTHROID, LEVOTHROID) tablet 25 mcg  25 mcg Oral QAC breakfast Gonzella Lex, MD   25 mcg at 11/28/15 [phone]  . LORazepam (ATIVAN) tablet 1 mg  1 mg Oral Q6H PRN Gonzella Lex, MD   1 mg at 11/28/15 0718  . magnesium hydroxide (MILK OF MAGNESIA) suspension 30 mL  30 mL Oral Daily PRN Gonzella Lex, MD      . multivitamin with minerals tablet 1 tablet  1 tablet Oral Daily Gonzella Lex, MD   1 tablet at 11/27/15 1658  . promethazine (PHENERGAN) tablet 12.5 mg  12.5 mg Oral Q4H PRN Gonzella Lex, MD   12.5 mg at 11/28/15 0037  . thiamine (VITAMIN B-1) tablet 100 mg  100 mg Oral Daily Gonzella Lex, MD   100 mg at 11/28/15 [id number]   Or  . thiamine (B-1) injection 100 mg  100 mg Intravenous Daily Gonzella Lex, MD        Lab Results:  Results for orders placed or performed during the  hospital encounter of 11/27/15 (from the past 48 hour(s))  Lipid panel     Status: Abnormal   Collection Time: 11/28/15  6:37 AM  Result Value Ref Range   Cholesterol 256 (H) 0 - 200 mg/dL   Triglycerides 119 <150 mg/dL   HDL 78 >40 mg/dL   Total CHOL/HDL Ratio 3.3 RATIO   VLDL 24 0 - 40 mg/dL   LDL Cholesterol 154 (H) 0 - 99 mg/dL    Comment:        Total Cholesterol/HDL:CHD Risk Coronary Heart Disease Risk Table                     Men   Women  1/2 Average Risk   3.4   3.3  Average Risk       5.0   4.4  2 X Average Risk   9.6   7.1  3 X Average Risk  23.4   11.0        Use the calculated Patient Ratio above and the CHD Risk Table to determine the patient's CHD Risk.        ATP III CLASSIFICATION (LDL):  <100     mg/dL   Optimal  100-129  mg/dL   Near or Above                    Optimal  130-159  mg/dL   Borderline  160-189  mg/dL   High  >190     mg/dL   Very High   TSH     Status: None   Collection Time: 11/28/15  6:37 AM  Result Value Ref Range   TSH 3.026 0.350 - 4.500 uIU/mL    Blood Alcohol level:  Lab Results  Component Value Date   [id number] (HH) [id number]    Metabolic Disorder Labs: No results found for: HGBA1C, MPG No results found for: PROLACTIN Lab Results  Component Value Date   CHOL 256 (H) 11/28/2015   TRIG 119 11/28/2015   HDL 78 11/28/2015   CHOLHDL 3.3 11/28/2015   VLDL 24 11/28/2015   LDLCALC 154 (H) 11/28/2015    Physical Findings: AIMS:  , ,  ,  ,    CIWA:  CIWA-Ar Total: 5 COWS:     Musculoskeletal: Strength & Muscle Tone: within normal limits Gait & Station: normal Patient leans: N/A  Psychiatric Specialty Exam: Physical Exam  Nursing note and vitals reviewed.   Review of Systems  Neurological: Positive for tremors.  Psychiatric/Behavioral: Positive for substance abuse.  All other systems reviewed and are negative.   Blood pressure (!) 143/94, pulse 91, temperature 99.3 F (37.4 C), temperature source Oral, resp. rate  18, height 5' 2.5" (1.588 m), weight 56.2 kg (124 lb), last menstrual period 09/25/2015, SpO2 100 %.Body mass index is 22.32 kg/m.  General Appearance: Disheveled  Eye Contact:  Good  Speech:  Clear and Coherent  Volume:  Normal  Mood:  Anxious  Affect:  Appropriate  Thought Process:  Goal Directed and Descriptions of Associations: Intact  Orientation:  Full (Time, Place, and Person)  Thought Content:  WDL  Suicidal Thoughts:  No  Homicidal Thoughts:  No  Memory:  Immediate;   Fair Recent;   Fair Remote;   Fair  Judgement:  Impaired  Insight:  Shallow  Psychomotor Activity:  Normal  Concentration:  Concentration: Fair and Attention Span: Fair  Recall:  [employer] of Knowledge:  Fair  Language:  Fair  Akathisia:  No  Handed:  Right  AIMS (if indicated):     Assets:  Communication Skills Desire for Improvement Housing Intimacy Resilience Social Support Transportation  ADL's:  Intact  Cognition:  WNL  Sleep:  Number of Hours: 5.25     Treatment Plan Summary: Daily contact with patient to assess and evaluate symptoms and progress in treatment and Medication management   Chloe [name] is a 40 year old female with history of lupus, seizures, depression and alcoholism admitted for disorganized in the context of alcohol abuse.  1. Mood. The patient responded well to Cymbalta in the past. We started Cymbalta 30 mg.  2. Alcohol abuse. Blood alcohol level was 490 admission. We started Librium taper in addition to CIWA. She does not believe that she treatment for substance abuse.  3. Lupus. She is on Plaquenil.  4. Seizures. She is on Keppra.  5. Hypothyroidism. She is on Synthroid.  6. Social. The patient needs to apply for disability and charity care at [facility] as she lost health insurance.  7. Disposition. She will be discharged to home with family. Follow up with [facility] in [place].   Orson Slick, MD 11/28/2015, 12:45 PM

## 2015-11-28 NOTE — Progress Notes (Signed)
Recreation Therapy Notes  INPATIENT RECREATION THERAPY ASSESSMENT  Patient Details Name: Chloe [name] MRN: [id number] DOB: 07-16-1975 Today's Date: 11/28/2015  Patient Stressors: Family, Work, Other (Comment) (Grandmother is losing hr mind; lost job in September; has to move out of her house; finances; no insurance)  Coping Skills:   Isolate, Exercise, Art/Dance, Talking, Music, Sports, Other ([place]) Lacinda Axon, entertain; decorate; family; going to [employer])  Personal Challenges: Concentration, Decision-Making, Self-Esteem/Confidence, Stress Management, Time Management, Trusting Others  Leisure Interests (2+):  Individual - Other (Comment) ([place] boarding; play with dog)  Awareness of Community Resources:  Yes  Community Resources:  Park  Current Use: Yes  If no, Barriers?:    Patient Strengths:  "Worry about others, struggling because she doesn't want to let other down, giving"  Patient Identified Areas of Improvement:  Finances job, family relationships, getting back to being her bubbly self  Current Recreation Participation:  Spend time with boyfriend  Patient Goal for Hospitalization:  To get connected with resources  [place] of Residence:  [place] of Residence:  [place]   Current SI (including self-harm):  No  Current HI:  No  Consent to Intern Participation: N/A   Leonette Monarch, LRT/CTRS 11/28/2015, 4:34 PM

## 2015-11-29 LAB — RAPID HIV SCREEN (HIV 1/2 AB+AG)
HIV 1/2 ANTIBODIES: NONREACTIVE
HIV-1 P24 Antigen - HIV24: NONREACTIVE

## 2015-11-29 LAB — FOLATE: Folate: 22 ng/mL (ref >5.9)

## 2015-11-29 LAB — HEMOGLOBIN A1C
Hgb A1c MFr Bld: 5.2 % (ref 4.8–5.6)
Mean Plasma Glucose: 103 mg/dL

## 2015-11-29 LAB — VITAMIN B12: VITAMIN B 12: 899 pg/mL (ref 180–914)

## 2015-11-29 MED ORDER — HALOPERIDOL LACTATE 5 MG/ML IJ SOLN: 1.0000 mg | Freq: Four times a day (QID) | INTRAMUSCULAR | Status: DC | PRN

## 2015-11-29 MED ORDER — CHLORDIAZEPOXIDE HCL 10 MG PO CAPS
10.0000 mg | ORAL_CAPSULE | Freq: Four times a day (QID) | ORAL | Status: DC
  Administered 2015-11-29 – 2015-11-30 (×3): 10 mg via ORAL
  Filled 2015-11-29 (×3): qty 1

## 2015-11-29 MED ORDER — HALOPERIDOL 0.5 MG PO TABS
1.0000 mg | ORAL_TABLET | Freq: Four times a day (QID) | ORAL | Status: DC | PRN
Start: 2015-11-29 — End: 2015-11-30
  Administered 2015-11-29: 1 mg via ORAL
  Filled 2015-11-29 (×2): qty 2

## 2015-11-29 MED ORDER — DULOXETINE HCL 60 MG PO CPEP
60.0000 mg | ORAL_CAPSULE | Freq: Every day | ORAL | Status: DC
  Administered 2015-11-30: 60 mg via ORAL
  Filled 2015-11-29: qty 1

## 2015-11-29 NOTE — BHH Group Notes (Signed)
[facility] Group Notes:  (Nursing/MHT/Case Management/Adjunct)  Date:  11/29/2015  Time:  11:06 PM  Type of Therapy:  Group Therapy  Participation Level:  Did Not Attend    Marylynn Pearson 11/29/2015, 11:06 PM

## 2015-11-29 NOTE — BHH Group Notes (Signed)
Goals Group  Date/Time: 9:00AM Type of Therapy and Topic: Group Therapy: Goals Group: SMART Goals  ?  Participation Level: Moderate  ?  Description of Group:  ?  The purpose of a daily goals group is to assist and guide patients in setting recovery/wellness-related goals. The objective is to set goals as they relate to the crisis in which they were admitted. Patients will be using SMART goal modalities to set measurable goals. Characteristics of realistic goals will be discussed and patients will be assisted in setting and processing how one will reach their goal. Facilitator will also assist patients in applying interventions and coping skills learned in psycho-education groups to the SMART goal and process how one will achieve defined goal.  ?  Therapeutic Goals:  ?  -Patients will develop and document one goal related to or their crisis in which brought them into treatment.  -Patients will be guided by LCSW using SMART goal setting modality in how to set a measurable, attainable, realistic and time sensitive goal.  -Patients will process barriers in reaching goal.  -Patients will process interventions in how to overcome and successful in reaching goal.  ?  Patient's Goal: Pt invited but did not attend. ?  Therapeutic Modalities:  Motivational Interviewing  Cognitive Behavioral Therapy  Crisis Intervention Model  SMART goals setting   Glorious Peach, MSW, LCSW-A 11/29/2015, 9:44AM

## 2015-11-29 NOTE — Progress Notes (Signed)
Recreation Therapy Notes  Date: 09.28.17 Time: 1:00 pm Location: Craft Room  Group Topic: Leisure Education  Goal Area(s) Addresses:  Patient will identify activities for each letter of the alphabet. Patient will verbalize ability to integrate positive leisure into life post d/c. Patient will verbalize ability to use leisure as a Technical sales engineer.  Behavioral Response: Attentive, Interactive  Intervention: Leisure Alphabet  Activity: Patients were given a Leisure Air traffic controller and instructed to pick healthy leisure activities for each letter of the alphabet.  Education: LRT educated patients on what they need to participate in leisure.  Education Outcome: In group clarification offered  Clinical Observations/Feedback: Patient completed activity by writting healthy leisure activities. Patient contributed to group discussion by stating healthy leisure activities. Patient did say trampoline when asked for a leisure activity that started with c. Patient did not seem to understand that trampoline is spelled with a t and not a c.  Leonette Monarch, LRT/CTRS 11/29/2015 2:15 PM

## 2015-11-29 NOTE — Plan of Care (Signed)
Problem: Safety: Goal: Periods of time without injury will increase Outcome: Progressing Ativan 1 mg PO PRN given for tremors and insomnia, 15 minute checks maintained for safety, clinical and moral support provided, patient encouraged to continue to express feelings and demonstrate safe care. Patient remains free from Seizures, Injury/harm, will continue to monitor.

## 2015-11-29 NOTE — Plan of Care (Signed)
Problem: [facility] Participation in Recreation Therapeutic Interventions Goal: STG-Patient will demonstrate improved self esteem by identif STG: Self-Esteem - Within 4 treatment sessions, patient will verbalize at least 5 positive affirmation statements in each of 2 treatment sessions to increase self-esteem post d/c.  Outcome: Progressing Treatment Session 1; Completed 1 out of 2: At approximately 10:00 am, LRT met with patient in consultation room. Patient verbalized 5 positive affirmation statements. Patient reported it felt "different at first, but good at the end". LRT encouraged patient continue saying positive affirmation statements.  Leonette Monarch, LRT/CTRS 09.28.17 10:28 am Goal: STG-Other Recreation Therapy Goal (Specify) STG: Stress Management - Within 4 treatment sessions, patient will verbalize understanding of the stress management techniques in each of 2 treatment sessions to increase stress management skills post d/c.  Outcome: Progressing Treatment Session 1; Completed 1 out of 2: At approximately 10:00 am, LRT met with patient in consultation room. LRT educated and provided patient with handouts on stress management techniques. Patient verbalized understanding. LRT encouraged patient to read over and practice the stress management techniques.  Leonette Monarch, LRT/CTRS 09.28.17 10:30 am

## 2015-11-29 NOTE — BHH Suicide Risk Assessment (Signed)
[place] INPATIENT:  Family/Significant Other Suicide Prevention Education  Suicide Prevention Education:  Education Completed; father, Layali Okabe ph#: [phone]; sister, Rulon Eisenmenger ph#: [phone]; boyfriend, Gene Crowder ph#: [phone]  has been identified by the patient as the family member/significant other with whom the patient will be residing, and identified as the person(s) who will aid the patient in the event of a mental health crisis (suicidal ideations/suicide attempt).  With written consent from the patient, the family member/significant other has been provided the following suicide prevention education, prior to the and/or following the discharge of the patient. Pt's father expressed a great deal of concern about pt's current state. Family does not know exactly what has caused her cognitive decline but is working together to reduce exposure to these negative symptoms.   The suicide prevention education provided includes the following:  Suicide risk factors  Suicide prevention and interventions  National Suicide Hotline telephone number  [facility] assessment telephone number  [facility] Emergency Assistance [place] and/or Residential Mobile Crisis Unit telephone number  Request made of family/significant other to:  Remove weapons (e.g., guns, rifles, knives), all items previously/currently identified as safety concern.    Remove drugs/medications (over-the-counter, prescriptions, illicit drugs), all items previously/currently identified as a safety concern.  The family member/significant other verbalizes understanding of the suicide prevention education information provided.  The family member/significant other agrees to remove the items of safety concern listed above.  Emilie Rutter, MSW, LCSW-A 11/29/2015, 10:03 AM

## 2015-11-29 NOTE — Progress Notes (Signed)
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
## 2015-11-29 NOTE — Progress Notes (Signed)
[facility] MD Progress Note  11/29/2015 11:46 AM Chloe [name]  MRN:  [id number]  Subjective:  Ms. Chloe [name] is utterly disorganized and unable to participate in a conversation. She seems lost on the unit. She repeatedly returns to the nursing station with the same question. According to her nurse, she was much more cohesive this morning pointing out tyo delirium. She is on librium taper. Vital signs are stable. Unable to participate in programming.   Principal Problem: Adjustment disorder with mixed anxiety and depressed mood Diagnosis:   Patient Active Problem List   Diagnosis Date Noted  . Adjustment disorder with mixed anxiety and depressed mood [F43.23] 11/27/2015  . Alcohol use disorder, severe, dependence ([place]) [F10.20] 11/26/2015  . Substance induced mood disorder ([place]) [F19.94] 11/26/2015  . Lupus ([place]) [M32.9] 11/26/2015  . Seizures ([place]) [R56.9] 11/26/2015   Total Time spent with patient: 20 minutes  Past Psychiatric History: Depression, alcoholism.  Past Medical History:  Past Medical History:  Diagnosis Date  . Anxiety   . Depression   . Hypertension   . Lupus ([place])   . Seizures ([place])     Past Surgical History:  Procedure Laterality Date  . BREAST ENHANCEMENT SURGERY     Family History: History reviewed. No pertinent family history. Family Psychiatric  History: Depression. Social History:  History  Alcohol Use  . Yes    Comment: occasional     History  Drug Use No    Social History   Social History  . Marital status: Single    Spouse name: N/A  . Number of children: N/A  . Years of education: N/A   Social History Main Topics  . Smoking status: Never Smoker  . Smokeless tobacco: Never Used  . Alcohol use Yes     Comment: occasional  . Drug use: No  . Sexual activity: Not Asked   Other Topics Concern  . None   Social History Narrative  . None   Additional Social History:                         Sleep: Fair  Appetite:  Fair  Current  Medications: Current Facility-Administered Medications  Medication Dose Route Frequency Provider Last Rate Last Dose  . acetaminophen (TYLENOL) tablet 650 mg  650 mg Oral Q6H PRN Gonzella Lex, MD   650 mg at 11/29/15 0515  . alum & mag hydroxide-simeth (MAALOX/MYLANTA) 200-200-20 MG/5ML suspension 30 mL  30 mL Oral Q4H PRN Gonzella Lex, MD      . chlordiazePOXIDE (LIBRIUM) capsule 25 mg  25 mg Oral QID Clovis Fredrickson, MD   25 mg at 11/29/15 1143  . DULoxetine (CYMBALTA) DR capsule 30 mg  30 mg Oral Daily Clovis Fredrickson, MD   30 mg at 11/29/15 0852  . folic acid (FOLVITE) tablet 1 mg  1 mg Oral Daily Gonzella Lex, MD   1 mg at 11/29/15 [id number]  . hydroxychloroquine (PLAQUENIL) tablet 200 mg  200 mg Oral Daily Gonzella Lex, MD   200 mg at 11/29/15 0852  . levETIRAcetam (KEPPRA) tablet 500 mg  500 mg Oral BID Gonzella Lex, MD   500 mg at 11/29/15 0852  . levothyroxine (SYNTHROID, LEVOTHROID) tablet 25 mcg  25 mcg Oral QAC breakfast Gonzella Lex, MD   25 mcg at 11/29/15 0645  . LORazepam (ATIVAN) tablet 1 mg  1 mg Oral Q6H PRN Gonzella Lex, MD   1 mg at  11/29/15 0519  . magnesium hydroxide (MILK OF MAGNESIA) suspension 30 mL  30 mL Oral Daily PRN Gonzella Lex, MD      . multivitamin with minerals tablet 1 tablet  1 tablet Oral Daily Gonzella Lex, MD   1 tablet at 11/27/15 1658  . promethazine (PHENERGAN) tablet 12.5 mg  12.5 mg Oral Q4H PRN Gonzella Lex, MD   12.5 mg at 11/28/15 0037  . thiamine (VITAMIN B-1) tablet 100 mg  100 mg Oral Daily Gonzella Lex, MD   100 mg at 11/29/15 [id number]   Or  . thiamine (B-1) injection 100 mg  100 mg Intravenous Daily Gonzella Lex, MD        Lab Results:  Results for orders placed or performed during the hospital encounter of 11/27/15 (from the past 48 hour(s))  Hemoglobin A1c     Status: None   Collection Time: 11/28/15  6:37 AM  Result Value Ref Range   Hgb A1c MFr Bld 5.2 4.8 - 5.6 %    Comment: (NOTE)         Pre-diabetes: 5.7  - 6.4         Diabetes: >6.4         Glycemic control for adults with diabetes: <7.0    Mean Plasma Glucose 103 mg/dL    Comment: (NOTE) Performed At: [facility] [place], [place] [id number] Lindon Romp MD [id number]   Lipid panel     Status: Abnormal   Collection Time: 11/28/15  6:37 AM  Result Value Ref Range   Cholesterol 256 (H) 0 - 200 mg/dL   Triglycerides 119 <150 mg/dL   HDL 78 >40 mg/dL   Total CHOL/HDL Ratio 3.3 RATIO   VLDL 24 0 - 40 mg/dL   LDL Cholesterol 154 (H) 0 - 99 mg/dL    Comment:        Total Cholesterol/HDL:CHD Risk Coronary Heart Disease Risk Table                     Men   Women  1/2 Average Risk   3.4   3.3  Average Risk       5.0   4.4  2 X Average Risk   9.6   7.1  3 X Average Risk  23.4   11.0        Use the calculated Patient Ratio above and the CHD Risk Table to determine the patient's CHD Risk.        ATP III CLASSIFICATION (LDL):  <100     mg/dL   Optimal  100-129  mg/dL   Near or Above                    Optimal  130-159  mg/dL   Borderline  160-189  mg/dL   High  >190     mg/dL   Very High   TSH     Status: None   Collection Time: 11/28/15  6:37 AM  Result Value Ref Range   TSH 3.026 0.350 - 4.500 uIU/mL  Folate     Status: None   Collection Time: 11/29/15  7:06 AM  Result Value Ref Range   Folate 22.0 >5.9 ng/mL  Rapid HIV screen (HIV 1/2 Ab+Ag)     Status: None   Collection Time: 11/29/15  7:06 AM  Result Value Ref Range   HIV-1 P24 Antigen - HIV24 NON REACTIVE NON REACTIVE  HIV 1/2 Antibodies NON REACTIVE NON REACTIVE   Interpretation (HIV Ag Ab)      A non reactive test result means that HIV 1 or HIV 2 antibodies and HIV 1 p24 antigen were not detected in the specimen.    Blood Alcohol level:  Lab Results  Component Value Date   ETH 491 (HH) [id number]    Metabolic Disorder Labs: Lab Results  Component Value Date   HGBA1C 5.2 11/28/2015   MPG 103 11/28/2015   No results found  for: PROLACTIN Lab Results  Component Value Date   CHOL 256 (H) 11/28/2015   TRIG 119 11/28/2015   HDL 78 11/28/2015   CHOLHDL 3.3 11/28/2015   VLDL 24 11/28/2015   LDLCALC 154 (H) 11/28/2015    Physical Findings: AIMS:  , ,  ,  ,    CIWA:  CIWA-Ar Total: 4 COWS:     Musculoskeletal: Strength & Muscle Tone: within normal limits Gait & Station: normal Patient leans: N/A  Psychiatric Specialty Exam: Physical Exam  Nursing note and vitals reviewed.   Review of Systems  Neurological: Positive for tremors.  Psychiatric/Behavioral: Positive for substance abuse.  All other systems reviewed and are negative.   Blood pressure (!) 133/110, pulse 80, temperature 98.8 F (37.1 C), resp. rate 18, height 5' 2.5" (1.588 m), weight 56.2 kg (124 lb), last menstrual period 09/25/2015, SpO2 100 %.Body mass index is 22.32 kg/m.  General Appearance: Disheveled  Eye Contact:  Good  Speech:  Clear and Coherent  Volume:  Normal  Mood:  Anxious  Affect:  Appropriate  Thought Process:  Goal Directed and Descriptions of Associations: Intact  Orientation:  Full (Time, Place, and Person)  Thought Content:  WDL  Suicidal Thoughts:  No  Homicidal Thoughts:  No  Memory:  Immediate;   Fair Recent;   Fair Remote;   Fair  Judgement:  Impaired  Insight:  Shallow  Psychomotor Activity:  Normal  Concentration:  Concentration: Fair and Attention Span: Fair  Recall:  [employer] of Knowledge:  Fair  Language:  Fair  Akathisia:  No  Handed:  Right  AIMS (if indicated):     Assets:  Communication Skills Desire for Improvement Housing Intimacy Resilience Social Support Transportation  ADL's:  Intact  Cognition:  WNL  Sleep:  Number of Hours: 1.45     Treatment Plan Summary: Daily contact with patient to assess and evaluate symptoms and progress in treatment and Medication management   Ms. Chloe [name] is a 40 year old female with history of lupus, seizures, depression and alcoholism admitted  for disorganized in the context of alcohol abuse.  1. Mood. The patient responded well to Cymbalta in the past. We increase Cymbalta to 60 mg.  2. Alcohol abuse. Blood alcohol level was 490 admission. We started Librium taper in addition to CIWA. She does not believe that she treatment for substance abuse.  3. Lupus. She is on Plaquenil.  4. Seizures. She is on Keppra. I spoke with Dr. Doy Mince, neurology, today who requested records from [facility]. According to the patient she had MRI there to show brain atrophy. Her father believes that she only had sleep study there.  5. Hypothyroidism. She is on Synthroid.  6. Social. The patient needs to apply for disability and charity care at [facility] as she lost health insurance.  7. Disposition. TBE.    Orson Slick, MD 11/29/2015, 11:46 AM

## 2015-11-29 NOTE — Tx Team (Signed)
Interdisciplinary Treatment and Diagnostic Plan Update  11/29/2015 Time of Session:10:30am  Chloe [name] MRN: [id number]  Principal Diagnosis: Adjustment disorder with mixed anxiety and depressed mood  Secondary Diagnoses: Principal Problem:   Adjustment disorder with mixed anxiety and depressed mood Active Problems:   Alcohol use disorder, severe, dependence (HCC)   Substance induced mood disorder (HCC)   Lupus (HCC)   Seizures (HCC)   Current Medications:  Current Facility-Administered Medications  Medication Dose Route Frequency Provider Last Rate Last Dose  . acetaminophen (TYLENOL) tablet 650 mg  650 mg Oral Q6H PRN Gonzella Lex, MD   650 mg at 11/29/15 0515  . alum & mag hydroxide-simeth (MAALOX/MYLANTA) 200-200-20 MG/5ML suspension 30 mL  30 mL Oral Q4H PRN Gonzella Lex, MD      . chlordiazePOXIDE (LIBRIUM) capsule 25 mg  25 mg Oral QID Clovis Fredrickson, MD   25 mg at 11/29/15 0852  . DULoxetine (CYMBALTA) DR capsule 30 mg  30 mg Oral Daily Clovis Fredrickson, MD   30 mg at 11/29/15 0852  . folic acid (FOLVITE) tablet 1 mg  1 mg Oral Daily Gonzella Lex, MD   1 mg at 11/29/15 [id number]  . hydroxychloroquine (PLAQUENIL) tablet 200 mg  200 mg Oral Daily Gonzella Lex, MD   200 mg at 11/29/15 0852  . levETIRAcetam (KEPPRA) tablet 500 mg  500 mg Oral BID Gonzella Lex, MD   500 mg at 11/29/15 0852  . levothyroxine (SYNTHROID, LEVOTHROID) tablet 25 mcg  25 mcg Oral QAC breakfast Gonzella Lex, MD   25 mcg at 11/29/15 0645  . LORazepam (ATIVAN) tablet 1 mg  1 mg Oral Q6H PRN Gonzella Lex, MD   1 mg at 11/29/15 0519  . magnesium hydroxide (MILK OF MAGNESIA) suspension 30 mL  30 mL Oral Daily PRN Gonzella Lex, MD      . multivitamin with minerals tablet 1 tablet  1 tablet Oral Daily Gonzella Lex, MD   1 tablet at 11/27/15 1658  . promethazine (PHENERGAN) tablet 12.5 mg  12.5 mg Oral Q4H PRN Gonzella Lex, MD   12.5 mg at 11/28/15 0037  . thiamine (VITAMIN B-1) tablet 100  mg  100 mg Oral Daily Gonzella Lex, MD   100 mg at 11/29/15 [id number]   Or  . thiamine (B-1) injection 100 mg  100 mg Intravenous Daily Gonzella Lex, MD       PTA Medications: No prescriptions prior to admission.    Patient Stressors: Occupational concerns Substance abuse  Patient Strengths: Average or above average intelligence Capable of independent living Communication skills Supportive family/friends Work skills  Treatment Modalities: Medication Management, Group therapy, Case management,  1 to 1 session with clinician, Psychoeducation, Recreational therapy.   Physician Treatment Plan for Primary Diagnosis: Adjustment disorder with mixed anxiety and depressed mood Long Term Goal(s): Improvement in symptoms so as ready for discharge Improvement in symptoms so as ready for discharge   Short Term Goals: Ability to identify changes in lifestyle to reduce recurrence of condition will improve Ability to verbalize feelings will improve Ability to disclose and discuss suicidal ideas Ability to demonstrate self-control will improve Ability to identify and develop effective coping behaviors will improve Ability to maintain clinical measurements within normal limits will improve Ability to identify changes in lifestyle to reduce recurrence of condition will improve Ability to demonstrate self-control will improve Ability to identify triggers associated with substance abuse/mental health issues will improve  Medication Management: Evaluate patient's response, side effects, and tolerance of medication regimen.  Therapeutic Interventions: 1 to 1 sessions, Unit Group sessions and Medication administration.  Evaluation of Outcomes: Progressing  Physician Treatment Plan for Secondary Diagnosis: Principal Problem:   Adjustment disorder with mixed anxiety and depressed mood Active Problems:   Alcohol use disorder, severe, dependence (HCC)   Substance induced mood disorder (HCC)   Lupus  ([place])   Seizures (HCC)  Long Term Goal(s): Improvement in symptoms so as ready for discharge Improvement in symptoms so as ready for discharge   Short Term Goals: Ability to identify changes in lifestyle to reduce recurrence of condition will improve Ability to verbalize feelings will improve Ability to disclose and discuss suicidal ideas Ability to demonstrate self-control will improve Ability to identify and develop effective coping behaviors will improve Ability to maintain clinical measurements within normal limits will improve Ability to identify changes in lifestyle to reduce recurrence of condition will improve Ability to demonstrate self-control will improve Ability to identify triggers associated with substance abuse/mental health issues will improve     Medication Management: Evaluate patient's response, side effects, and tolerance of medication regimen.  Therapeutic Interventions: 1 to 1 sessions, Unit Group sessions and Medication administration.  Evaluation of Outcomes: Progressing   RN Treatment Plan for Primary Diagnosis: Adjustment disorder with mixed anxiety and depressed mood Long Term Goal(s): Knowledge of disease and therapeutic regimen to maintain health will improve  Short Term Goals: Ability to verbalize feelings will improve, Ability to disclose and discuss suicidal ideas and Ability to identify and develop effective coping behaviors will improve  Medication Management: RN will administer medications as ordered by provider, will assess and evaluate patient's response and provide education to patient for prescribed medication. RN will report any adverse and/or side effects to prescribing provider.  Therapeutic Interventions: 1 on 1 counseling sessions, Psychoeducation, Medication administration, Evaluate responses to treatment, Monitor vital signs and CBGs as ordered, Perform/monitor CIWA, COWS, AIMS and Fall Risk screenings as ordered, Perform wound care  treatments as ordered.  Evaluation of Outcomes: Progressing   LCSW Treatment Plan for Primary Diagnosis: Adjustment disorder with mixed anxiety and depressed mood Long Term Goal(s): Safe transition to appropriate next level of care at discharge, Engage patient in therapeutic group addressing interpersonal concerns.  Short Term Goals: Engage patient in aftercare planning with referrals and resources, Increase ability to appropriately verbalize feelings, Facilitate acceptance of mental health diagnosis and concerns, Facilitate patient progression through stages of change regarding substance use diagnoses and concerns, Identify triggers associated with mental health/substance abuse issues and Increase skills for wellness and recovery  Therapeutic Interventions: Assess for all discharge needs, 1 to 1 time with Social worker, Explore available resources and support systems, Assess for adequacy in community support network, Educate family and significant other(s) on suicide prevention, Complete Psychosocial Assessment, Interpersonal group therapy.  Evaluation of Outcomes: Progressing   Progress in Treatment: Attending groups: Yes. Participating in groups: Yes. Taking medication as prescribed: Yes. Toleration medication: Yes. Family/Significant other contact made: Yes, individual(s) contacted:  Dad, Boyfriend and Sister Patient understands diagnosis: Yes. Discussing patient identified problems/goals with staff: Yes. Medical problems stabilized or resolved: Yes. Denies suicidal/homicidal ideation: Yes. Issues/concerns per patient self-inventory: Yes. Other:   New problem(s) identified: No, Describe:     New Short Term/Long Term Goal(s):  Discharge Plan or Barriers: Discharge home with sister in [place], follow up with [place] and referral to [facility] and [facility] program  Reason for Continuation  of Hospitalization: Anxiety Depression Medication  stabilization Withdrawal symptoms  Estimated Length of Stay:1-2 days  Attendees: Patient:Chloe [name] 11/29/2015 10:57 AM  Physician: Herma Ard Puc[place]ska 11/29/2015 10:57 AM  Nursing: Anderson Malta RN 11/29/2015 10:57 AM  RN Care Manager: 11/29/2015 10:57 AM  Social Worker: Glorious Peach, LCSW 11/29/2015 10:57 AM  Recreational Therapist: Everitt Amber, [place] 11/29/2015 10:57 AM  Other:  11/29/2015 10:57 AM  Other:  11/29/2015 10:57 AM  Other: 11/29/2015 10:57 AM    Scribe for Treatment Team: Emilie Rutter, [place] 11/29/2015 10:57 AM

## 2015-11-30 ENCOUNTER — Encounter: Payer: Self-pay | Admitting: Internal Medicine

## 2015-11-30 ENCOUNTER — Inpatient Hospital Stay
Admission: AD | Admit: 2015-11-30 | Discharge: 2015-12-02 | DRG: 897 | Disposition: A | Discharge status: Home / Self Care | Payer: Self-pay | Source: Ambulatory Visit | Attending: Internal Medicine | Admitting: Internal Medicine

## 2015-11-30 DIAGNOSIS — E039 Hypothyroidism, unspecified: Secondary | ICD-10-CM | POA: Diagnosis present

## 2015-11-30 DIAGNOSIS — I10 Essential (primary) hypertension: Secondary | ICD-10-CM | POA: Diagnosis present

## 2015-11-30 DIAGNOSIS — F10231 Alcohol dependence with withdrawal delirium: Principal | ICD-10-CM | POA: Diagnosis present

## 2015-11-30 DIAGNOSIS — F10939 Alcohol use, unspecified with withdrawal, unspecified: Secondary | ICD-10-CM | POA: Diagnosis present

## 2015-11-30 DIAGNOSIS — Z888 Allergy status to other drugs, medicaments and biological substances status: Secondary | ICD-10-CM

## 2015-11-30 DIAGNOSIS — F10931 Alcohol use, unspecified with withdrawal delirium: Secondary | ICD-10-CM | POA: Diagnosis present

## 2015-11-30 DIAGNOSIS — F419 Anxiety disorder, unspecified: Secondary | ICD-10-CM | POA: Diagnosis present

## 2015-11-30 DIAGNOSIS — F10239 Alcohol dependence with withdrawal, unspecified: Secondary | ICD-10-CM | POA: Diagnosis present

## 2015-11-30 DIAGNOSIS — M329 Systemic lupus erythematosus, unspecified: Secondary | ICD-10-CM | POA: Diagnosis present

## 2015-11-30 DIAGNOSIS — F329 Major depressive disorder, single episode, unspecified: Secondary | ICD-10-CM | POA: Diagnosis present

## 2015-11-30 LAB — COMPREHENSIVE METABOLIC PANEL
ALBUMIN: 4 g/dL (ref 3.5–5.0)
ALK PHOS: 96 U/L (ref 38–126)
ALT: 92 U/L — AB (ref 14–54)
ANION GAP: 10 (ref 5–15)
AST: 201 U/L — ABNORMAL HIGH (ref 15–41)
BILIRUBIN TOTAL: 1.8 mg/dL — AB (ref 0.3–1.2)
BUN: 13 mg/dL (ref 6–20)
CALCIUM: 9 mg/dL (ref 8.9–10.3)
CO2: 25 mmol/L (ref 22–32)
CREATININE: 0.72 mg/dL (ref 0.44–1.00)
Chloride: 102 mmol/L (ref 101–111)
GFR calc Af Amer: >60 mL/min (ref >60)
GFR calc non Af Amer: >60 mL/min (ref >60)
GLUCOSE: 95 mg/dL (ref 65–99)
Potassium: 2.8 mmol/L — ABNORMAL LOW (ref 3.5–5.1)
SODIUM: 137 mmol/L (ref 135–145)
TOTAL PROTEIN: 7.6 g/dL (ref 6.5–8.1)

## 2015-11-30 LAB — CBC
HEMATOCRIT: 38.5 % (ref 35.0–47.0)
Hemoglobin: 13.3 g/dL (ref 12.0–16.0)
MCH: 34.4 pg — ABNORMAL HIGH (ref 26.0–34.0)
MCHC: 34.4 g/dL (ref 32.0–36.0)
MCV: 99.8 fL (ref 80.0–100.0)
Platelets: 94 10*3/uL — ABNORMAL LOW (ref 150–440)
RBC: 3.86 MIL/uL (ref 3.80–5.20)
RDW: 13 % (ref 11.5–14.5)
WBC: 4.1 10*3/uL (ref 3.6–11.0)

## 2015-11-30 LAB — RPR: RPR: NONREACTIVE

## 2015-11-30 MED ORDER — ENOXAPARIN SODIUM 40 MG/0.4ML ~~LOC~~ SOLN
40.0000 mg | SUBCUTANEOUS | Status: DC
  Administered 2015-11-30 – 2015-12-01 (×2): 40 mg via SUBCUTANEOUS
  Filled 2015-11-30 (×3): qty 0.4

## 2015-11-30 MED ORDER — SODIUM CHLORIDE 0.9% FLUSH
3.0000 mL | Freq: Two times a day (BID) | INTRAVENOUS | Status: DC
  Administered 2015-11-30 – 2015-12-02 (×4): 3 mL via INTRAVENOUS

## 2015-11-30 MED ORDER — LORAZEPAM 2 MG PO TABS
2.0000 mg | ORAL_TABLET | Freq: Once | ORAL | Status: AC
  Administered 2015-11-30: 2 mg via ORAL
  Filled 2015-11-30: qty 1

## 2015-11-30 MED ORDER — FOLIC ACID 1 MG PO TABS
1.0000 mg | ORAL_TABLET | Freq: Every day | ORAL | Status: DC
  Administered 2015-12-01 – 2015-12-02 (×2): 1 mg via ORAL
  Filled 2015-11-30 (×2): qty 1

## 2015-11-30 MED ORDER — DIPHENHYDRAMINE HCL 25 MG PO CAPS
50.0000 mg | ORAL_CAPSULE | Freq: Once | ORAL | Status: AC
  Administered 2015-11-30: 50 mg via ORAL
  Filled 2015-11-30: qty 2

## 2015-11-30 MED ORDER — LORAZEPAM 2 MG/ML IJ SOLN
2.0000 mg | INTRAMUSCULAR | Status: DC | PRN
Start: 2015-11-30 — End: 2015-12-02
  Administered 2015-12-01 (×3): 2 mg via INTRAVENOUS
  Filled 2015-11-30 (×3): qty 1

## 2015-11-30 MED ORDER — ONDANSETRON HCL 4 MG/2ML IJ SOLN
4.0000 mg | Freq: Four times a day (QID) | INTRAMUSCULAR | Status: DC | PRN
  Administered 2015-12-01: 4 mg via INTRAVENOUS
  Filled 2015-11-30: qty 2

## 2015-11-30 MED ORDER — HALOPERIDOL 5 MG PO TABS
5.0000 mg | ORAL_TABLET | Freq: Once | ORAL | Status: AC
  Administered 2015-11-30: 5 mg via ORAL
  Filled 2015-11-30: qty 1

## 2015-11-30 MED ORDER — ONDANSETRON HCL 4 MG PO TABS
4.0000 mg | ORAL_TABLET | Freq: Four times a day (QID) | ORAL | Status: DC | PRN
  Filled 2015-11-30: qty 1

## 2015-11-30 MED ORDER — MORPHINE SULFATE (PF) 2 MG/ML IV SOLN: 2.0000 mg | INTRAVENOUS | Status: DC | PRN

## 2015-11-30 MED ORDER — ACETAMINOPHEN 325 MG PO TABS: 650.0000 mg | ORAL_TABLET | Freq: Four times a day (QID) | ORAL | Status: DC | PRN

## 2015-11-30 MED ORDER — VITAMIN B-1 100 MG PO TABS
100.0000 mg | ORAL_TABLET | Freq: Every day | ORAL | Status: DC
  Administered 2015-12-01 – 2015-12-02 (×2): 100 mg via ORAL
  Filled 2015-11-30 (×2): qty 1

## 2015-11-30 MED ORDER — LEVETIRACETAM 500 MG PO TABS
500.0000 mg | ORAL_TABLET | Freq: Two times a day (BID) | ORAL | Status: DC
  Administered 2015-12-01 – 2015-12-02 (×4): 500 mg via ORAL
  Filled 2015-11-30 (×4): qty 1

## 2015-11-30 MED ORDER — LEVOTHYROXINE SODIUM 25 MCG PO TABS
25.0000 ug | ORAL_TABLET | Freq: Every day | ORAL | Status: DC
  Administered 2015-12-01 – 2015-12-02 (×2): 25 ug via ORAL
  Filled 2015-11-30 (×2): qty 1

## 2015-11-30 MED ORDER — OXYCODONE HCL 5 MG PO TABS: 5.0000 mg | ORAL_TABLET | ORAL | Status: DC | PRN

## 2015-11-30 MED ORDER — ACETAMINOPHEN 650 MG RE SUPP: 650.0000 mg | Freq: Four times a day (QID) | RECTAL | Status: DC | PRN

## 2015-11-30 MED ORDER — HYDROXYCHLOROQUINE SULFATE 200 MG PO TABS
200.0000 mg | ORAL_TABLET | Freq: Every day | ORAL | Status: DC
  Administered 2015-12-01 – 2015-12-02 (×3): 200 mg via ORAL
  Filled 2015-11-30 (×3): qty 1

## 2015-11-30 MED ORDER — DEXMEDETOMIDINE HCL IN NACL 400 MCG/100ML IV SOLN: 0.2000 ug/kg/h | INTRAVENOUS | Status: AC

## 2015-11-30 MED ORDER — ADULT MULTIVITAMIN W/MINERALS CH
1.0000 | ORAL_TABLET | Freq: Every day | ORAL | Status: DC
  Administered 2015-12-01 – 2015-12-02 (×2): 1 via ORAL
  Filled 2015-11-30 (×2): qty 1

## 2015-11-30 NOTE — Progress Notes (Signed)
Recreation Therapy Notes  Date: 09.29.17 Time: 9:30 am Location: Craft Room  Group Topic: Problem Solving, Communication, Teamwork  Goal Area(s) Addresses:  Patient will effectively work with peer towards shared goal. Patient will identify skills used to make activity successful. Patient will identify benefit of using group skills effectively post d/c.  Behavioral Response: Did not attend  Intervention: [employer]  Activity: Patients were split into groups and given 15 pipe cleaners. They were instructed to build a free standing tower using all 15 pipe cleaners. After about 5 minutes, patients were instructed to put their dominant hand behind their back. After another 5 minutes, patients were instructed to stop talking to each other.  Education: LRT educated patients on healthy support systems.  Education Outcome: Patient did not attend group.   Clinical Observations/Feedback: Patient did not attend group.  Leonette Monarch, LRT/CTRS 11/30/2015 10:12 AM

## 2015-11-30 NOTE — Progress Notes (Signed)
Recreation Therapy Notes  INPATIENT RECREATION TR PLAN  Patient Details Name: Chloe [name] MRN: [id number] DOB: 07/18/1975 Today's Date: 11/30/2015  Rec Therapy Plan Is patient appropriate for Therapeutic Recreation?: Yes Treatment times per week: At least once a week TR Treatment/Interventions: 1:1 session, Group participation (Comment) (Appropriate participation in daily recreational therapy tx)  Discharge Criteria Pt will be discharged from therapy if:: Treatment goals are met, Discharged Treatment plan/goals/alternatives discussed and agreed upon by:: Patient/family  Discharge Summary Short term goals set: See Care Plan Short term goals met: Adequate for discharge Progress toward goals comments: One-to-one attended Which groups?: Leisure education One-to-one attended: Self-esteem, stress management Reason goals not met: N/A Therapeutic equipment acquired: None Reason patient discharged from therapy: Other (Comment) (D/C to medical unit) Date patient discharged from therapy: 11/30/15   Leonette Monarch, LRT/CTRS 11/30/2015, 2:45 PM

## 2015-11-30 NOTE — Plan of Care (Signed)
Problem: Education: Goal: Mental status will improve Outcome: Not Progressing Confused, disoriented to place, time and situation' re-oriented to place, time and situation; intrusive, difficult to re-direct, group-ready-inappropriate, argumentative, CIWA=6 at MN. Patient, is, however, medication compliant but not responding to them. Will continue to monitor for safety.

## 2015-11-30 NOTE — Progress Notes (Signed)
Patient knocking on each door looking for Chloe [name], her boyfriend. Will continue to redirect and maintain safety.

## 2015-11-30 NOTE — [facility] Suicide Risk Assessment (Signed)
[facility] Discharge Suicide Risk Assessment   Principal Problem: Adjustment disorder with mixed anxiety and depressed mood Discharge Diagnoses:  Patient Active Problem List   Diagnosis Date Noted  . Alcohol withdrawal delirium ([place]) [F10.231] 11/30/2015  . Adjustment disorder with mixed anxiety and depressed mood [F43.23] 11/27/2015  . Alcohol use disorder, severe, dependence ([place]) [F10.20] 11/26/2015  . Substance induced mood disorder ([place]) [F19.94] 11/26/2015  . Lupus ([place]) [M32.9] 11/26/2015  . Seizures ([place]) [R56.9] 11/26/2015    Total Time spent with patient: 30 minutes  Musculoskeletal: Strength & Muscle Tone: within normal limits Gait & Station: normal Patient leans: N/A  Psychiatric Specialty Exam: Review of Systems  Neurological: Positive for tremors.  Psychiatric/Behavioral: Positive for hallucinations and substance abuse. The patient has insomnia.   All other systems reviewed and are negative.   Blood pressure (S) (!) 146/109, pulse (S) (!) 120, temperature 98.9 F (37.2 C), temperature source Oral, resp. rate 20, height 5' 2.5" (1.588 m), weight 56.2 kg (124 lb), last menstrual period 09/25/2015, SpO2 100 %.Body mass index is 22.32 kg/m.  General Appearance: Fairly Groomed  Engineer, water::  Minimal  Speech:  Clear and Coherent409  Volume:  Normal  Mood:  Angry and Dysphoric  Affect:  Congruent  Thought Process:  Disorganized and Descriptions of Associations: Loose  Orientation:  Other:  To person only.  Thought Content:  Illogical and Hallucinations: Auditory Visual  Suicidal Thoughts:  No  Homicidal Thoughts:  No  Memory:  Immediate;   Poor Recent;   Poor Remote;   Poor  Judgement:  Poor  Insight:  Lacking  Psychomotor Activity:  Increased  Concentration:  Poor  Recall:  Poor  Fund of Knowledge:Fair  Language: Fair  Akathisia:  No  Handed:  Right  AIMS (if indicated):     Assets:  Communication Skills Desire for Improvement Resilience Social Support   Sleep:  Number of Hours: (S) 0.15  Cognition: WNL  ADL's:  Intact   Mental Status Per Nursing Assessment::   On Admission:     Demographic Factors:  Divorced or widowed, Caucasian, Living alone and Unemployed  Loss Factors: Decrease in vocational status, Decline in physical health and Financial problems/change in socioeconomic status  Historical Factors: Family history of mental illness or substance abuse and Impulsivity  Risk Reduction Factors:   Sense of responsibility to family and Positive social support  Continued Clinical Symptoms:  Depression:   Comorbid alcohol abuse/dependence Impulsivity Insomnia Alcohol/Substance Abuse/Dependencies Medical Diagnoses and Treatments/Surgeries  Cognitive Features That Contribute To Risk:  Loss of executive function    Suicide Risk:  Minimal: No identifiable suicidal ideation.  Patients presenting with no risk factors but with morbid ruminations; may be classified as minimal risk based on the severity of the depressive symptoms  Follow-up [place]. Go today.   Why:  NOT COMPLETE Contact information: [address], [place]  PHONE: [phone]  FAX: [phone]          Plan Of Care/Follow-up recommendations:  Activity:  As tolerated. Diet:  Low sodium heart healthy. Other:  Keep follow-up appointments.  Orson Slick, MD 11/30/2015, 2:32 PM

## 2015-11-30 NOTE — H&P (Signed)
[facility] NAME: Chloe [name]    MR#:  [id number]  DATE OF BIRTH:  01/25/1976   DATE OF ADMISSION:  11/30/15 PRIMARY CARE PHYSICIAN: No primary care provider on file.   REQUESTING/REFERRING PHYSICIAN: Dr Bary Leriche  CHIEF COMPLAINT:  Agitation  HISTORY OF PRESENT ILLNESS:  Chloe [name]  is a 40 y.o. female with a known history of Lupus, seizures who was originally admitted 11/27/2015 to the psychiatry unit for on behavior. On original presentation her home level was 490 since admission she is becoming increasingly agitated showing evidence of alcohol withdrawal. I have been contacted by the psychiatrist for medical admission.  Patient examined and Behavioral Health unit markedly agitated, confused, delirious unable to provide information  PAST MEDICAL HISTORY:   Past Medical History:  Diagnosis Date  . Anxiety   . Depression   . Hypertension   . Lupus ([place])   . Seizures ([place])     PAST SURGICAL HISTORY:   Past Surgical History:  Procedure Laterality Date  . BREAST ENHANCEMENT SURGERY      SOCIAL HISTORY:   Social History  Substance Use Topics  . Smoking status: Never Smoker  . Smokeless tobacco: Never Used  . Alcohol use Yes     Comment: occasional    FAMILY HISTORY:   Family History  Problem Relation Age of Onset  . Diabetes Neg Hx     DRUG ALLERGIES:   Allergies  Allergen Reactions  . Sumatriptan Anaphylaxis    REVIEW OF SYSTEMS:  markedly agitated, confused, delirious unable to provide information   MEDICATIONS AT HOME:   Prior to Admission medications   Not on File      VITAL SIGNS:  Last menstrual period 09/25/2015.  PHYSICAL EXAMINATION:  VITAL SIGNS:There were no vitals filed for this visit. GENERAL:40 y.o.female currently Moderate distress given mental status, diaphoretic HEAD: Normocephalic, atraumatic.  EYES: Pupils equal, round, reactive to light. Extraocular muscles intact.  No scleral icterus.  MOUTH: Moist mucosal membrane. Dentition intact. No abscess noted.  EAR, NOSE, THROAT: Clear without exudates. No external lesions.  NECK: Supple. No thyromegaly. No nodules. No JVD.  PULMONARY: Clear to ascultation, without wheeze rails or rhonci. No use of accessory muscles, Good respiratory effort. good air entry bilaterally CHEST: Nontender to palpation.  CARDIOVASCULAR: S1 and S2. Tachycardic No murmurs, rubs, or gallops. No edema. Pedal pulses 2+ bilaterally.  GASTROINTESTINAL: Soft, nontender, nondistended. No masses. Positive bowel sounds. No hepatosplenomegaly.  MUSCULOSKELETAL: No swelling, clubbing, or edema. Range of motion full in all extremities.  NEUROLOGIC: Cranial nerves II through XII are intact. No gross focal neurological deficits. Sensation intact. Reflexes intact.  SKIN: Diaphoretic, No ulceration, lesions, rashes, or cyanosis. Skin warm and dry. Turgor intact.  PSYCHIATRIC: Mood, affect anxious/agitated. The patient is awake, alert and oriented x 1 Insight, judgment poor.    LABORATORY PANEL:   CBC  Recent Labs Lab 11/26/15 1219  WBC 2.5*  HGB 15.3  HCT 43.4  PLT 101*   ------------------------------------------------------------------------------------------------------------------  Chemistries   Recent Labs Lab 11/26/15 1219  NA 138  K 3.2*  CL 102  CO2 26  GLUCOSE 155*  BUN 9  CREATININE 0.66  CALCIUM 8.4*  AST 578*  ALT 119*  ALKPHOS 118  BILITOT 1.1   ------------------------------------------------------------------------------------------------------------------  Cardiac Enzymes No results for input(s): TROPONINI in the last 168 hours. ------------------------------------------------------------------------------------------------------------------  RADIOLOGY:  No results found.  EKG:   Orders placed or performed during the hospital encounter  of 11/27/15  . EKG 12-Lead  . EKG 12-Lead  . EKG 12-Lead  . EKG  12-Lead    IMPRESSION AND PLAN:   40 year old Caucasian female history of lupus originally admitted to behavioral health experiencing symptoms of alcohol withdrawal, poorly controlled  1. Alcohol withdrawal with delirium: Place in stepdown unit, initiate aggressive CIWA protocol, Precedex drip, multivitamin and thiamine and folate replacements 2. Accelerated hypertension: Likely related to alcohol withdrawal we will add IV hydralazine as needed 3. Lupus continue with Plaquenil, if symptoms do not resolve despite treatment of alcohol withdrawal there is a remote possibility of lupus cerebritis, so if no improvement CT head versus MRI brain 4. Hypothyroidism unspecified: Synthroid    All the records are reviewed and case discussed with ED provider. Management plans discussed with the patient, family and they are in agreement.  CODE STATUS: Full  TOTAL TIME TAKING CARE OF THIS PATIENT: 45 critical care minutes.    Hower,  Karenann Cai.D on 11/30/2015 at 3:29 PM  Between 7am to 6pm - Pager - [phone]  After 6pm: House Pager: - [phone]  [facility]  Office  [phone]  CC: Primary care physician; No primary care provider on file.

## 2015-11-30 NOTE — BHH Group Notes (Signed)
[place] LCSW Group Therapy   11/30/2015 1:00 PM   Type of Therapy: Group Therapy   Participation Level: Active   Participation Quality: Attentive, Sharing and Supportive   Affect: Appropriate   Cognitive: Alert and Oriented   Insight: Developing/Improving and Engaged   Engagement in Therapy: Developing/Improving and Engaged   Modes of Intervention: Clarification, Confrontation, Discussion, Education, Exploration, Limit-setting, Orientation, Problem-solving, Rapport Building, Art therapist, Socialization and Support   Summary of Progress/Problems: The topic for today was feelings about relapse. Pt discussed what relapse prevention is to them and identified triggers that they are on the path to relapse. Pt processed their feeling towards relapse and was able to relate to peers. Pt discussed coping skills that can be used for relapse prevention.  Pt was unable to engage and appropriately participate in group due to manic behaviors.   Glorious Peach, MSW, LCSWA 11/30/2015, 2:06PM

## 2015-11-30 NOTE — Progress Notes (Signed)
Patient is out of control, not yielding to verbal re-direct, intrusive to others room, looking for a way out and loud, argumentative and confrontational. Dr. Weber Cooks ordered Ativan 2 mg x1, and was given; patient was remorseful and apologetic "I am sorry for my behaviors, I am dying, I lost my job and I am very sick .Marland Kitchen.." will continue to monitor.

## 2015-11-30 NOTE — Progress Notes (Signed)
Called by psychiatry Dr. Bary Leriche In regards to patient with active alcohol withdrawal I will accept the patient for the hospitalist team place in stepdown unit initiate CIWA protocol may require Precedex versus Ativan drip  Full note to follow and orders to follow when new encounter is available

## 2015-11-30 NOTE — Progress Notes (Addendum)
Pt arrived to CCU in stable condition and lethargic. Pt is responsive to stimulation but remains lethargic at this time. Pt is in stable condition. Pt remains asleep through out my shift. Full assessment in EPIC. Report given to [place], [place].

## 2015-11-30 NOTE — Progress Notes (Signed)
  [facility] Adult Case Management Discharge Plan :  Will you be returning to the same living situation after discharge:  No. At discharge, do you have transportation home?: Yes,  father will pick pt up at discharge Do you have the ability to pay for your medications: No.  Release of information consent forms completed and in the chart;  Patient's signature needed at discharge.  Patient to Follow up at: Follow-up Information    [facility]. Go on 12/10/2015.   Why:  Please arrive to [facility] for your hospital follow-up appointment at [facility]. It is important that you bring your discharge paperwork to this appointment for medication management and therapy. Contact Sherrian Divers with questions at [phone]. Contact information: [address] [place], [place] [address] Phone: [phone] Fax: [phone]          Next level of care provider has access to [place] and Suicide Prevention discussed: Yes,  SPE reviewed with pt and father  Have you used any form of tobacco in the last 30 days? (Cigarettes, Smokeless Tobacco, Cigars, and/or Pipes): No  Has patient been referred to the Quitline?: N/A patient is not a smoker  Patient has been referred for addiction treatment: Pt. refused referral  Emilie Rutter, MSW, LCSW-A 11/30/2015, 3:09 PM

## 2015-11-30 NOTE — Progress Notes (Addendum)
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 Blood alcohol level on admission was 490. In spite of lithium and Seroquel protocol Chloe Chloe [name] started developing symptoms of with visual hallucinations and agitation. He seems to be improving today.   Today, Chloe Chloe [name] is a restless, disorganized and unable to participate in a conversation. She is trying to get out of Chloe she believes that her brother and her boyfriend are and has been searching for them constantly. Her last pressure is elevated, heart rate of 120.    Principal Problem: Adjustment disorder with mixed anxiety and depressed mood Diagnosis:   Chloe [name] Active Problem List   Diagnosis Date Noted  . Adjustment disorder with mixed anxiety and depressed mood [F43.23] 11/27/2015  . Alcohol use disorder, severe, dependence ([place]) [F10.20] 11/26/2015  . Substance induced mood disorder ([place]) [F19.94] 11/26/2015  . Lupus ([place]) [M32.9] 11/26/2015  . Seizures ([place]) [R56.9] 11/26/2015   Total Time spent with Chloe [name]: 20 minutes  Past Psychiatric History: Depression, alcoholism.  Past Medical History:  Past Medical History:  Diagnosis Date  . Anxiety   . Depression   . Hypertension   . Lupus ([place])   . Seizures ([place])     Past Surgical History:  Procedure Laterality Date  . BREAST ENHANCEMENT SURGERY     Family History: History reviewed. No pertinent family history. Family Psychiatric  History: Depression. Social History:  History  Alcohol Use  . Yes    Comment: occasional     History  Drug Use No    Social History   Social History  . Marital status: Single    Spouse name: N/A  . Number of children: N/A  . Years of education: N/A   Social History Main Topics  . Smoking status: Never Smoker  . Smokeless tobacco: Never Used  . Alcohol use Yes     Comment: occasional  . Drug use: No  . Sexual  activity: Not Asked   Other Topics Concern  . None   Social History Narrative  . None   Additional Social History:                         Sleep: Fair  Appetite:  Fair  Current Medications: Current Facility-Administered Medications  Medication Dose Route Frequency Provider Last Rate Last Dose  . acetaminophen (TYLENOL) tablet 650 mg  650 mg Oral Q6H PRN Gonzella Lex, MD   650 mg at 11/30/15 0020  . alum & mag hydroxide-simeth (MAALOX/MYLANTA) 200-200-20 MG/5ML suspension 30 mL  30 mL Oral Q4H PRN Gonzella Lex, MD      . chlordiazePOXIDE (LIBRIUM) capsule 10 mg  10 mg Oral QID Par[place] Po B Lucus Lambertson, MD   10 mg at 11/30/15 1200  . folic acid (FOLVITE) tablet 1 mg  1 mg Oral Daily Gonzella Lex, MD   1 mg at 11/30/15 1031  . haloperidol (HALDOL) tablet 1 mg  1 mg Oral Q6H PRN Clovis Fredrickson, MD   1 mg at 11/29/15 2031   Or  . haloperidol lactate (HALDOL) injection 1 mg  1 mg Intramuscular Q6H PRN Jhace Fennell B Emanual Lamountain, MD      . hydroxychloroquine (PLAQUENIL) tablet 200 mg  200 mg Oral Daily Gonzella Lex, MD   200 mg at 11/30/15 1032  . levETIRAcetam (KEPPRA) tablet 500 mg  500 mg Oral BID Gonzella Lex, MD   500 mg at 11/30/15 1033  . levothyroxine (SYNTHROID, LEVOTHROID) tablet 25 mcg  25 mcg Oral QAC breakfast Gonzella Lex, MD   25 mcg at 11/30/15 [id number]  . LORazepam (ATIVAN) tablet 1 mg  1 mg Oral Q6H PRN Gonzella Lex, MD   1 mg at 11/30/15 0606  . magnesium hydroxide (MILK OF MAGNESIA) suspension 30 mL  30 mL Oral Daily PRN Gonzella Lex, MD      . multivitamin with minerals tablet 1 tablet  1 tablet Oral Daily Gonzella Lex, MD   1 tablet at 11/30/15 0926  . promethazine (PHENERGAN) tablet 12.5 mg  12.5 mg Oral Q4H PRN Gonzella Lex, MD   12.5 mg at 11/28/15 0037  . thiamine (VITAMIN B-1) tablet 100 mg  100 mg Oral Daily Gonzella Lex, MD   100 mg at 11/30/15 [id number]   Or  . thiamine (B-1) injection 100 mg  100 mg Intravenous Daily Gonzella Lex, MD         Lab Results:  Results for orders placed or performed during Chloe hospital encounter of 11/27/15 (from Chloe past 48 hour(s))  Vitamin B12     Status: None   Collection Time: 11/29/15  7:06 AM  Result Value Ref Range   Vitamin B-12 899 180 - 914 pg/mL    Comment: (NOTE) This assay is not validated for testing neonatal or myeloproliferative syndrome specimens for Vitamin B12 levels. Performed at [facility]   Folate     Status: None   Collection Time: 11/29/15  7:06 AM  Result Value Ref Range   Folate 22.0 >5.9 ng/mL  Rapid HIV screen (HIV 1/2 Ab+Ag)     Status: None   Collection Time: 11/29/15  7:06 AM  Result Value Ref Range   HIV-1 P24 Antigen - HIV24 NON REACTIVE NON REACTIVE   HIV 1/2 Antibodies NON REACTIVE NON REACTIVE   Interpretation (HIV Ag Ab)      A non reactive test result means that HIV 1 or HIV 2 antibodies and HIV 1 p24 antigen were not detected in Chloe specimen.  RPR     Status: None   Collection Time: 11/29/15  7:06 AM  Result Value Ref Range   RPR Ser Ql Non Reactive Non Reactive    Comment: (NOTE) Performed At: [facility] [address] [place], [place] [id number] Lindon Romp MD [id number]     Blood Alcohol level:  Lab Results  Component Value Date   [id number] [facility]) [id number]    Metabolic Disorder Labs: Lab Results  Component Value Date   HGBA1C 5.2 11/28/2015   MPG 103 11/28/2015   No results found for: PROLACTIN Lab Results  Component Value Date   CHOL 256 (H) 11/28/2015   TRIG 119 11/28/2015   HDL 78 11/28/2015   CHOLHDL 3.3 11/28/2015   VLDL 24 11/28/2015   LDLCALC 154 (H) 11/28/2015    Physical Findings: AIMS:  , ,  ,  ,    CIWA:  CIWA-Ar Total: 12 COWS:     Musculoskeletal: Strength & Muscle Tone: within normal limits Gait & Station: normal Chloe [name] leans: N/A  Psychiatric Specialty Exam: Physical Exam  Nursing note and vitals reviewed.   Review of Systems  Neurological: Positive for tremors.   Psychiatric/Behavioral: Positive for substance abuse.  All other systems reviewed and are negative.   Blood pressure (S) (!) 146/109, pulse (S) (!) 120,  temperature 98.9 F (37.2 C), temperature source Oral, resp. rate 20, height 5' 2.5" (1.588 m), weight 56.2 kg (124 lb), last menstrual period 09/25/2015, SpO2 100 %.Body mass index is 22.32 kg/m.  General Appearance: Disheveled  Eye Contact:  Good  Speech:  Clear and Coherent  Volume:  Normal  Mood:  Anxious  Affect:  Appropriate  Thought Process:  Goal Directed and Descriptions of Associations: Intact  Orientation:  Full (Time, Place, and Person)  Thought Content:  WDL  Suicidal Thoughts:  No  Homicidal Thoughts:  No  Memory:  Immediate;   Fair Recent;   Fair Remote;   Fair  Judgement:  Impaired  Insight:  Shallow  Psychomotor Activity:  Normal  Concentration:  Concentration: Fair and Attention Span: Fair  Recall:  [employer] of Knowledge:  Fair  Language:  Fair  Akathisia:  No  Handed:  Right  AIMS (if indicated):     Assets:  Communication Skills Desire for Improvement Housing Intimacy Resilience Social Support Transportation  ADL's:  Intact  Cognition:  WNL  Sleep:  Number of Hours: (S) 0.15     Treatment Plan Summary: Daily contact with Chloe [name] to assess and evaluate symptoms and progress in treatment and Medication management   Chloe Chloe [name] is a 40 year old female with history of lupus, seizures, depression and alcoholism admitted for disorganized in Chloe context of alcohol abuse.  1. Mood. Chloe Chloe [name] responded well to Cymbalta in Chloe past. We increase Cymbalta to 60 mg.  2. Alcohol Withdrawal delirium. Blood alcohol level was 490 admission. We started Librium taper in addition to CIWA. Haldol is also available.  3. Lupus. She is on Plaquenil.  4. Seizures. She is on Keppra. I spoke with Dr. Doy Mince, neurology, today who requested records from [facility]. According to Chloe Chloe [name] she had MRI  there to show brain atrophy. Her father believes that she only had sleep study there.  5. Hypothyroidism. She is on Synthroid.  6. Social. Chloe Chloe [name] needs to apply for disability and charity care at [facility] as she lost health insurance.  7. Insomnia. She did not sleep at all last night.   8. Disposition. She will likely stay with her father. She will follow up with [facility].    Orson Slick, MD 11/30/2015, 12:28 PM

## 2015-11-30 NOTE — Progress Notes (Signed)
Patient spent less than 15 minutes in her room, pacing the hall and looking for a way out. Patient claimed that "there are 5 people in my room, I don't want to wake them up.Marland KitchenMarland KitchenIf you don't let me call Melburn Popper, my father will sue your A**"

## 2015-11-30 NOTE — Progress Notes (Signed)
Patient less irritable at this time. CIWA monitored and recorded. Meds as ordered. Patient is looking for her wallet in her room, thinks she hid it from herself. Orient to belongings policy and that if pt.wallet on admission to hospital it would be inventoried and put away for safe keeping. Pt.does not comprehend or retain info. Meds given as scheduled and patient again starts looking for her wallet in her room, stating "now where did I put it". Safety maintained.

## 2015-11-30 NOTE — Progress Notes (Signed)
Patient to discharge and be admitted to medical unit 1 C room 131 at this time. Patient remains alert and oriented to name. Disoriented to time and situation. Irritable with little sleep last night. Vital sings elevated, monitored and recorded. MD aware and meds as ordered with good results to lower blood pressure and pulse. Patient with visible tremor and anxiety. Patient remains a wandering risk as is disoriented and pacing hall. Does not do well with redirection. Safety maintained at this time.

## 2015-11-30 NOTE — Progress Notes (Signed)
Patient with anxious affect, disorganized behavior. No SI/HI. Patient alert and oriented name and place. Patient disoriented to situation and time. Patient delusional and states she saw her boyfriend and his brother in her room in the next bed wearing racing helmets. Engineer, production chased them away. MD aware and med changes considered. PO fluids and rest encouraged as patient only slept briefly last night. Poor safety awareness, wearing no socks. Socks encouraged. Safety maintained.

## 2015-11-30 NOTE — BHH Group Notes (Signed)
[place] LCSW Group Therapy   11/29/2015  1 pm *Late Entry  Type of Therapy: Group Therapy   Participation Level: Did Not Attend. Patient invited to participate but declined.    Alphonse Guild. Garima Chronis, MSW, LCSWA, LCAS

## 2015-11-30 NOTE — Progress Notes (Signed)
Patient phones father to report her wallet is missing. Father Runner, broadcasting/film/video for update. Father states "I have her wallet.". Father has security code and update provided. Patient at nurses station staring at [employer]. One on one with patient and patient is teary and attempting to Systems developer. Limit set. Encouraged patient to lay down and rest till lunch as patient did not sleep last night.

## 2015-11-30 NOTE — Progress Notes (Signed)
Paranoid, elopement risk, visually hallucinating "I can see people from my window try to come and get me", intrusive: "I was just trying to help her move in..", difficult to redirect, packed belongings and ready to go; sporadically on the floor in her room and on bed trying to nap, can't keep still, spent mos of the night in the hallway, confused her room number, "I thought I was on a Medical Unit, why am I in [facility] Unit, [id number].." Explanation of Lower Level Room 03 provided, re-orientation provided, Ativan 1 mg PO, PRN q6* given; no seizure activities, will continue to re-direct and monitor for safety.

## 2015-11-30 NOTE — Consult Note (Signed)
Psychiatry: Brief consult for this patient just transferred from the behavioral health unit. Patient known to me from admission in the emergency room as well as follow-up downstairs. 40 year old woman who presented to the emergency room several days ago extremely intoxicated with confusion. Since admission to the psychiatry ward has developed delirium. Presumed etiology delirium tremens. Patient was transferred to the medical service and critical care unit because of anticipated need for larger doses of benzodiazepines or other sedating medicines to treat delirium tremens.  Patient seen and I spoke with nursing briefly. Patient fell asleep soon after arriving in the CCU. Appeared to be comfortable. Vitals now stable.  I will follow-up during her time on the medical service. Thank you to medical service for assistance.

## 2015-11-30 NOTE — Discharge Summary (Addendum)
Physician Discharge Summary Note  Patient:  Chloe [name] is an 40 y.o., female MRN:  [id number] DOB:  1975/10/04 Patient phone:  [phone] (home)  Patient address:   [address] [place] [address],  Total Time spent with patient: 30 minutes  Date of Admission:  11/27/2015 Date of Discharge: 11/30/2015  Reason for Admission:  Depression, alcohol withdrawal delirium.  Identifying data. Ms. Chloe [name] is a 40 year old female with a remote history of depression.  Chief complaint. "It was either seeing my father's psychiatrist or coming here."  History of present illness. Information was obtained from and the chart. 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 She started experiencing financial difficulties and became somewhat depressed with sadness, disturbed sleep, decreased appetite, feeling of hopelessness and worthlessness. She was not interested in getting out of the house which she partly blames on frequent seizure episodes. Her father has been increasingly worried and gave the patient an ultimatum forcing her to come to the hospital. Upon arrival, the patient was not able to provide much information as she was driving with blood alcohol level of 490. Today she is pleasant polite and cooperative. She denies symptoms of severe depression or anxiety. She denies psychotic symptoms. She is not suicidal or homicidal. She denies  Heavy alcohol use but admits to drinking on the night of admission. She denies prescription pill or illicit substance abuse. The patient admits that she was surprised when she was fired from her job and does not good plan of what to do next. She feels that her general health has been deteriorating. She has been experiencing frequent seizures. She is in the care of [facility] neurology. She takes Keppra regularly. Her boyfriend recently shared some of her unusual behaviors,  most likely seizures, on his cell phone camera.   Past psychiatric history. She never attempted suicide, was hospitalized, or in substance abuse treatment. She was treated with Cymbalta at some point with success. She was in therapy 3 times in her life including her divorces.  Family psychiatric history. Father with depression. 2 of her father's sister is with severe mental illness while with bipolar and another one institutionalized.  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 She just lost her job. She was fired.. She lost her insurance. She has a boyfriend who is very supportive. They've known each other for 5 years but have been dating for 11 months. She lives independently in [place]. She no longer be able to ford this type arangement. She cannot count on much support from her family.  Principal Problem: Adjustment disorder with mixed anxiety and depressed mood Discharge Diagnoses: Patient Active Problem List   Diagnosis Date Noted  . Alcohol withdrawal delirium ([place]) [F10.231] 11/30/2015  . Adjustment disorder with mixed anxiety and depressed mood [F43.23] 11/27/2015  . Alcohol use disorder, severe, dependence ([place]) [F10.20] 11/26/2015  . Substance induced mood disorder ([place]) [F19.94] 11/26/2015  . Lupus ([place]) [M32.9] 11/26/2015  . Seizures ([place]) [R56.9] 11/26/2015    Past Medical History:  Past Medical History:  Diagnosis Date  . Anxiety   . Depression   . Hypertension   . Lupus ([place])   . Seizures ([place])     Past Surgical History:  Procedure Laterality Date  . BREAST ENHANCEMENT SURGERY     Family History: History reviewed. No pertinent family history.  Social History:  History  Alcohol Use  . Yes    Comment: occasional     History  Drug Use No    Social History   Social History  . Marital status: Single    Spouse name: N/A  . Number of children: N/A  . Years of education: N/A   Social History Main Topics  . Smoking status: Never Smoker  .  Smokeless tobacco: Never Used  . Alcohol use Yes     Comment: occasional  . Drug use: No  . Sexual activity: Not Asked   Other Topics Concern  . None   Social History Narrative  . None    Hospital Course:    [name] is a 40 year old female with history of lupus, seizures, depression and alcoholism admitted for disorganized behavior in the context of alcohol abuse.  1. Mood. We discontinued Cymbalta due to delirium.   2. Alcohol Withdrawal delirium. Blood alcohol level was 490 admission. We started Librium taper in addition to CIWA protocol and Haldol. The patient remains confused, psychotic, and restless. I spoke with Dr. Lavetta Nielsen who accepted her to his service.   3. Lupus. She is on Plaquenil.  4. Seizures. She is on Keppra. I spoke with Dr. Doy Mince, neurology, today who requested records from [facility]. According to the patient she had MRI there to show brain atrophy. Her father believes that she only had sleep study there.  5. Hypothyroidism. She is on Synthroid.  6. Social. The patient needs to apply for disability and charity care at [facility] as she lost health insurance.  7. Insomnia. She did not sleep at all last night.   8. Disposition. She will be transferred to medical floor for treatment of alcohol withdrawal delirium.     Physical Findings: AIMS:  , ,  ,  ,    CIWA:  CIWA-Ar Total: 12 COWS:     Musculoskeletal: Strength & Muscle Tone: within normal limits Gait & Station: normal Patient leans: N/A  Psychiatric Specialty Exam: Physical Exam  Nursing note and vitals reviewed.   Review of Systems  Neurological: Positive for tremors.  Psychiatric/Behavioral: Positive for depression, hallucinations and substance abuse. The patient has insomnia.   All other systems reviewed and are negative.   Blood pressure (S) (!) 146/109, pulse (S) (!) 120, temperature 98.9 F (37.2 C), temperature source Oral, resp. rate 20, height 5' 2.5" (1.588 m), weight  56.2 kg (124 lb), last menstrual period 09/25/2015, SpO2 100 %.Body mass index is 22.32 kg/m.  See SRA.                                                  Sleep:  Number of Hours: (S) 0.15     Have you used any form of tobacco in the last 30 days? (Cigarettes, Smokeless Tobacco, Cigars, and/or Pipes): No  Has this patient used any form of tobacco in the last 30 days? (Cigarettes, Smokeless Tobacco, Cigars, and/or Pipes) Yes, No  Blood Alcohol level:  Lab Results  Component Value Date   ETH 491 (HH) [id number]    Metabolic Disorder Labs:  Lab Results  Component Value Date   HGBA1C 5.2 11/28/2015   MPG 103 11/28/2015   No results found for: PROLACTIN Lab Results  Component Value Date   CHOL 256 (H) 11/28/2015   TRIG 119 11/28/2015   HDL 78 11/28/2015  CHOLHDL 3.3 11/28/2015   VLDL 24 11/28/2015   LDLCALC 154 (H) 11/28/2015    See Psychiatric Specialty Exam and Suicide Risk Assessment completed by Attending Physician prior to discharge.  Discharge destination:  Other:  medical floor.  Is patient on multiple antipsychotic therapies at discharge:  No   Has Patient had three or more failed trials of antipsychotic monotherapy by history:  No  Recommended Plan for Multiple Antipsychotic Therapies: NA  Discharge Instructions    Diet - low sodium heart healthy    Complete by:  As directed    Increase activity slowly    Complete by:  As directed        Medication List    You have not been prescribed any medications.    [place]. Go today.   Why:  NOT COMPLETE Contact information: [address] [place], [place]  PHONE: [phone]  FAX: [phone]          Follow-up recommendations:  Activity:  As tolerated. Diet:  Low sodium heart healthy. Other:  Keep follow-up appointments.  Comments:    Signed: Orson Slick, MD 11/30/2015, 2:37 PM

## 2015-11-30 NOTE — Progress Notes (Signed)
Admission orders entered, pending floor arrival

## 2015-12-01 LAB — MRSA PCR SCREENING: MRSA BY PCR: NEGATIVE

## 2015-12-01 MED ORDER — DULOXETINE HCL 30 MG PO CPEP
30.0000 mg | ORAL_CAPSULE | Freq: Every day | ORAL | Status: DC
  Administered 2015-12-01 – 2015-12-02 (×2): 30 mg via ORAL
  Filled 2015-12-01 (×2): qty 1

## 2015-12-01 MED ORDER — CHLORDIAZEPOXIDE HCL 10 MG PO CAPS
10.0000 mg | ORAL_CAPSULE | Freq: Three times a day (TID) | ORAL | Status: DC
  Administered 2015-12-01 – 2015-12-02 (×4): 10 mg via ORAL
  Filled 2015-12-01 (×5): qty 1

## 2015-12-01 MED ORDER — POTASSIUM CHLORIDE CRYS ER 20 MEQ PO TBCR
40.0000 meq | EXTENDED_RELEASE_TABLET | Freq: Once | ORAL | Status: AC
  Administered 2015-12-01: 40 meq via ORAL
  Filled 2015-12-01: qty 2

## 2015-12-01 MED ORDER — POTASSIUM CHLORIDE 10 MEQ/100ML IV SOLN
10.0000 meq | INTRAVENOUS | Status: AC
  Administered 2015-12-01 (×3): 10 meq via INTRAVENOUS
  Filled 2015-12-01 (×3): qty 100

## 2015-12-01 MED ORDER — POTASSIUM CHLORIDE 10 MEQ/50ML IV SOLN
10.0000 meq | INTRAVENOUS | Status: DC
  Filled 2015-12-01 (×3): qty 50

## 2015-12-01 NOTE — Consult Note (Signed)
Psychiatry: Came by to follow-up on this 40 year old woman having delirium tremens. She was in the bathroom for an extended period of time and I was not able to speak with her. Reviewed chart. Spoke with nursing on duty. Patient is still having some symptoms that qualify for CIWA protocol Ativan but is otherwise described as being awake alert and appropriately interactive and apparently steady enough on her feet to use the bathroom on her own. I will follow-up again tomorrow. If it really looks like the delirium is resolved in under good control we can consider transfer back down stairs or discharge whichever is appropriate.

## 2015-12-01 NOTE — Progress Notes (Signed)
Dr. Lavetta Nielsen asked of patient needed continued telemetry. Dr stated to discontinue telemetry.

## 2015-12-01 NOTE — Plan of Care (Signed)
Problem: Education: Goal: Knowledge of [facility] Education information/materials will improve Outcome: Progressing BP (!) 125/95   Pulse 81   Temp 98.2 F (36.8 C) (Oral)   Resp 18   Ht 5\' 2"  (1.575 m)   Wt 56.2 kg (123 lb 14.4 oz)   LMP 09/25/2015   SpO2 100%   BMI 22.66 kg/m   POC reviewed with patient, cont on encouraging frequent repositioning, vital signs closely monitored and any concerns addressed at this time. Will continue to monitor.   Intake/Output Summary (Last 24 hours) at 12/01/15 1019 Last data filed at 12/01/15 0810  Gross per 24 hour  Intake              360 ml  Output              200 ml  Net              160 ml    BM 12/01/2015  x1 assist for movement  Skin - bruises to nose, chin and scattered bruises on body from post fall due to seizure episodes.  Commode by bedside  Possible transfer to floor

## 2015-12-01 NOTE — Progress Notes (Signed)
[facility] [place] NAME: Chloe [name]    MRN#:  [id number]  DATE OF BIRTH:  1975-07-12  SUBJECTIVE:  Hospital Day: 1 day Chloe [name] is a 40 y.o. female presenting with Agitation/confusion.   Overnight events: No acute overnight events Interval Events: No complaints this morning father at bedside patient close to baseline  REVIEW OF SYSTEMS:  CONSTITUTIONAL: No fever, fatigue or weakness.  EYES: No blurred or double vision.  EARS, NOSE, AND THROAT: No tinnitus or ear pain.  RESPIRATORY: No cough, shortness of breath, wheezing or hemoptysis.  CARDIOVASCULAR: No chest pain, orthopnea, edema.  GASTROINTESTINAL: No nausea, vomiting, diarrhea or abdominal pain.  GENITOURINARY: No dysuria, hematuria.  ENDOCRINE: No polyuria, nocturia,  HEMATOLOGY: No anemia, easy bruising or bleeding SKIN: No rash or lesion. MUSCULOSKELETAL: No joint pain or arthritis.   NEUROLOGIC: No tingling, numbness, weakness.  PSYCHIATRY: No anxiety or depression.   DRUG ALLERGIES:   Allergies  Allergen Reactions  . Sumatriptan Anaphylaxis    VITALS:  Blood pressure 116/73, pulse 87, temperature 98.4 F (36.9 C), temperature source Oral, resp. rate 12, height 5\' 2"  (1.575 m), weight 56.2 kg (123 lb 14.4 oz), last menstrual period 09/25/2015, SpO2 96 %.  PHYSICAL EXAMINATION:  VITAL SIGNS: Vitals:   12/01/15 1030 12/01/15 1116  BP: (!) 136/96 116/73  Pulse: 97 87  Resp: 12   Temp:  98.4 F (36.9 C)   GENERAL:40 y.o.female currently in no acute distress.  HEAD: Normocephalic, atraumatic.  EYES: Pupils equal, round, reactive to light. Extraocular muscles intact. No scleral icterus.  MOUTH: Moist mucosal membrane. Dentition intact. No abscess noted.  EAR, NOSE, THROAT: Clear without exudates. No external lesions.  NECK: Supple. No thyromegaly. No nodules. No JVD.  PULMONARY: Clear to ascultation, without wheeze rails or rhonci. No use of accessory  muscles, Good respiratory effort. good air entry bilaterally CHEST: Nontender to palpation.  CARDIOVASCULAR: S1 and S2. Regular rate and rhythm. No murmurs, rubs, or gallops. No edema. Pedal pulses 2+ bilaterally.  GASTROINTESTINAL: Soft, nontender, nondistended. No masses. Positive bowel sounds. No hepatosplenomegaly.  MUSCULOSKELETAL: No swelling, clubbing, or edema. Range of motion full in all extremities.  NEUROLOGIC: Cranial nerves II through XII are intact. No gross focal neurological deficits. Sensation intact. Reflexes intact.  SKIN: No ulceration, lesions, rashes, or cyanosis. Skin warm and dry. Turgor intact.  PSYCHIATRIC: Mood, affect within normal limits. The patient is awake, alert and oriented x 3. Insight, judgment intact.      LABORATORY PANEL:   CBC  Recent Labs Lab 11/30/15 1841  WBC 4.1  HGB 13.3  HCT 38.5  PLT 94*   ------------------------------------------------------------------------------------------------------------------  Chemistries   Recent Labs Lab 11/30/15 1841  NA 137  K 2.8*  CL 102  CO2 25  GLUCOSE 95  BUN 13  CREATININE 0.72  CALCIUM 9.0  AST 201*  ALT 92*  ALKPHOS 96  BILITOT 1.8*   ------------------------------------------------------------------------------------------------------------------  Cardiac Enzymes No results for input(s): TROPONINI in the last 168 hours. ------------------------------------------------------------------------------------------------------------------  RADIOLOGY:  No results found.  EKG:   Orders placed or performed during the hospital encounter of 11/27/15  . EKG 12-Lead  . EKG 12-Lead  . EKG 12-Lead  . EKG 12-Lead    ASSESSMENT AND PLAN:   Chloe [name] is a 40 y.o. female presenting with Confusion and agitation. Admitted 11/30/2015 : Day #: 1 day 1. Alcohol withdrawal with delirium: Much improved since yesterday continue on CIWA protocol, transfer out of  intensive care unit 2. Anxiety  or depression not otherwise specified: Cymbalta 3. Lupus: Plaquenil 4. Hypothyroidism unspecified: Synthroid  Disposition: Anticipate discharge likely tomorrow versus the next day depending on Ativan requirements   All the records are reviewed and case discussed with Care Management/Social Workerr. Management plans discussed with the patient, family and they are in agreement.  CODE STATUS: full TOTAL TIME TAKING CARE OF THIS PATIENT: 28 minutes.   POSSIBLE D/C IN 1-2DAYS, DEPENDING ON CLINICAL CONDITION.   Finneus Kaneshiro,  Karenann Cai.D on 12/01/2015 at 11:31 AM  Between 7am to 6pm - Pager - [phone]  After 6pm: House Pager: - [phone]  [facility]  Office  [phone]  CC: Primary care physician; No primary care provider on file.

## 2015-12-01 NOTE — Plan of Care (Signed)
Gave report to April RN

## 2015-12-02 MED ORDER — DULOXETINE HCL 30 MG PO CPEP: 30.0000 mg | ORAL_CAPSULE | Freq: Every day | ORAL | 3 refills | Status: DC

## 2015-12-02 MED ORDER — THIAMINE HCL 100 MG PO TABS: 100.0000 mg | ORAL_TABLET | Freq: Every day | ORAL | 0 refills | Status: DC

## 2015-12-02 MED ORDER — LEVETIRACETAM 500 MG PO TABS: 500.0000 mg | ORAL_TABLET | Freq: Two times a day (BID) | ORAL | 0 refills | Status: DC

## 2015-12-02 MED ORDER — HYDROXYCHLOROQUINE SULFATE 200 MG PO TABS: 200.0000 mg | ORAL_TABLET | Freq: Every day | ORAL | 0 refills | Status: DC

## 2015-12-02 MED ORDER — LEVOTHYROXINE SODIUM 25 MCG PO TABS: 25.0000 ug | ORAL_TABLET | Freq: Every day | ORAL | 0 refills | Status: DC

## 2015-12-02 MED ORDER — FOLIC ACID 1 MG PO TABS: 1.0000 mg | ORAL_TABLET | Freq: Every day | ORAL | 0 refills | Status: DC

## 2015-12-02 NOTE — Consult Note (Signed)
Psychiatry: Came by to around on patient this afternoon and found that she had been discharged and already left the hospital. Patient did have involuntary commitment papers on her chart that I believe had one more day of validity on them. Appears to of been overlooked. Treatment team here must've felt that she was safe for discharge.

## 2015-12-02 NOTE — Progress Notes (Signed)
Patient discharging home, VSS. Instructions and prescriptions given to patient. Verbalized understanding.Waiting on family for transportation.

## 2015-12-02 NOTE — Discharge Summary (Signed)
Chloe [name]    MR#:  [id number]  DATE OF BIRTH:  [birth year]  DATE OF ADMISSION:  11/30/2015 ADMITTING PHYSICIAN: Lytle Butte, MD  DATE OF DISCHARGE: 12/02/15  PRIMARY CARE PHYSICIAN: No primary care provider on file.    ADMISSION DIAGNOSIS:  Alcohol withdrawal and delirium  DISCHARGE DIAGNOSIS:  Active Problems:   Alcohol withdrawal delirium (HCC)   Alcohol withdrawal ([place])   SECONDARY DIAGNOSIS:   Past Medical History:  Diagnosis Date  . Anxiety   . Depression   . Hypertension   . Lupus   . Seizures [facility])     HOSPITAL COURSE:  Chloe [name]  is a 40 y.o. female admitted 11/30/2015 with chief complaint delirium . Please see H&P performed by Lytle Butte, MD for further information. Patient was admitted to the medical service from behavioral health with active alcohol withdrawal. She has markedly improved back to baseline, required 1 dose ativan in the last 24 hours.  DISCHARGE CONDITIONS:   stable  CONSULTS OBTAINED:  Treatment Team:  Lytle Butte, MD Gonzella Lex, MD  DRUG ALLERGIES:   Allergies  Allergen Reactions  . Sumatriptan Anaphylaxis    DISCHARGE MEDICATIONS:   Current Discharge Medication List    START taking these medications   Details  DULoxetine (CYMBALTA) 30 MG capsule Take 1 capsule (30 mg total) by mouth daily. Qty: 30 capsule, Refills: 3    folic acid (FOLVITE) 1 MG tablet Take 1 tablet (1 mg total) by mouth daily. Qty: 30 tablet, Refills: 0    hydroxychloroquine (PLAQUENIL) 200 MG tablet Take 1 tablet (200 mg total) by mouth daily. Qty: 30 tablet, Refills: 0    levETIRAcetam (KEPPRA) 500 MG tablet Take 1 tablet (500 mg total) by mouth 2 (two) times daily. Qty: 60 tablet, Refills: 0    levothyroxine (SYNTHROID, LEVOTHROID) 25 MCG tablet Take 1 tablet (25 mcg total) by mouth daily before breakfast. Qty: 30 tablet, Refills: 0    thiamine 100 MG tablet Take  1 tablet (100 mg total) by mouth daily. Qty: 30 tablet, Refills: 0         DISCHARGE INSTRUCTIONS:    DIET:  Regular diet  DISCHARGE CONDITION:  Good  ACTIVITY:  Activity as tolerated  OXYGEN:  Home Oxygen: No.   Oxygen Delivery: room air  DISCHARGE LOCATION:  home   If you experience worsening of your admission symptoms, develop shortness of breath, life threatening emergency, suicidal or homicidal thoughts you must seek medical attention immediately by calling 911 or calling your MD immediately  if symptoms less severe.  You Must read complete instructions/literature along with all the possible adverse reactions/side effects for all the Medicines you take and that have been prescribed to you. Take any new Medicines after you have completely understood and accpet all the possible adverse reactions/side effects.   Please note  You were cared for by a hospitalist during your hospital stay. If you have any questions about your discharge medications or the care you received while you were in the hospital after you are discharged, you can call the unit and asked to speak with the hospitalist on call if the hospitalist that took care of you is not available. Once you are discharged, your primary care physician will handle any further medical issues. Please note that NO REFILLS for any discharge medications will be authorized once you are discharged, as it is imperative that you return  to your primary care physician (or establish a relationship with a primary care physician if you do not have one) for your aftercare needs so that they can reassess your need for medications and monitor your lab values.    On the day of Discharge:   VITAL SIGNS:  Blood pressure 114/70, pulse 78, temperature 98.4 F (36.9 C), temperature source Oral, resp. rate 18, height 5\' 2"  (1.575 m), weight 56.2 kg (123 lb 14.4 oz), last menstrual period 09/25/2015, SpO2 100 %.  I/O:   Intake/Output Summary  (Last 24 hours) at 12/02/15 0913 Last data filed at 12/02/15 0716  Gross per 24 hour  Intake              240 ml  Output                0 ml  Net              240 ml    PHYSICAL EXAMINATION:  GENERAL:  40 y.o.-year-old patient lying in the bed with no acute distress.  EYES: Pupils equal, round, reactive to light and accommodation. No scleral icterus. Extraocular muscles intact.  HEENT: Head atraumatic, normocephalic. Oropharynx and nasopharynx clear.  NECK:  Supple, no jugular venous distention. No thyroid enlargement, no tenderness.  LUNGS: Normal breath sounds bilaterally, no wheezing, rales,rhonchi or crepitation. No use of accessory muscles of respiration.  CARDIOVASCULAR: S1, S2 normal. No murmurs, rubs, or gallops.  ABDOMEN: Soft, non-tender, non-distended. Bowel sounds present. No organomegaly or mass.  EXTREMITIES: No pedal edema, cyanosis, or clubbing.  NEUROLOGIC: Cranial nerves II through XII are intact. Muscle strength 5/5 in all extremities. Sensation intact. Gait not checked.  PSYCHIATRIC: The patient is alert and oriented x 3.  SKIN: No obvious rash, lesion, or ulcer.   DATA REVIEW:   CBC  Recent Labs Lab 11/30/15 1841  WBC 4.1  HGB 13.3  HCT 38.5  PLT 94*    Chemistries   Recent Labs Lab 11/30/15 1841  NA 137  K 2.8*  CL 102  CO2 25  GLUCOSE 95  BUN 13  CREATININE 0.72  CALCIUM 9.0  AST 201*  ALT 92*  ALKPHOS 96  BILITOT 1.8*    Cardiac Enzymes No results for input(s): TROPONINI in the last 168 hours.  Microbiology Results  Results for orders placed or performed during the hospital encounter of 11/30/15  MRSA PCR Screening     Status: None   Collection Time: 12/01/15  4:16 AM  Result Value Ref Range Status   MRSA by PCR NEGATIVE NEGATIVE Final    Comment:        The GeneXpert MRSA Assay (FDA approved for NASAL specimens only), is one component of a comprehensive MRSA colonization surveillance program. It is not intended to diagnose  MRSA infection nor to guide or monitor treatment for MRSA infections.     RADIOLOGY:  No results found.   Management plans discussed with the patient, family and they are in agreement.  CODE STATUS:     Code Status Orders        Start     Ordered   11/30/15 1753  Full code  Continuous     11/30/15 1752    Code Status History    Date Active Date Inactive Code Status Order ID Comments User Context   11/27/2015  3:30 PM 11/30/2015  4:39 PM Full Code [id number]  Gonzella Lex, MD Inpatient   11/26/2015  1:53 PM 11/27/2015  1:54 PM  Full Code [id number]  Eula Listen, MD ED      TOTAL TIME TAKING CARE OF THIS PATIENT: 33 minutes.    Hower,  Karenann Cai.D on 12/02/2015 at 9:13 AM  Between 7am to 6pm - Pager - [phone]  After 6pm go to [email or web address] - [employer] [facility]  Office  [phone]  CC: Primary care physician; No primary care provider on file.

## 2015-12-06 LAB — GLUCOSE, CAPILLARY: GLUCOSE-CAPILLARY: 148 mg/dL — AB (ref 65–99)

## 2015-12-27 ENCOUNTER — Inpatient Hospital Stay
Admission: EM | Admit: 2015-12-27 | Discharge: 2015-12-31 | DRG: 493 | Disposition: A | Discharge status: Home / Self Care | Payer: Self-pay | Attending: Orthopedic Surgery | Admitting: Orthopedic Surgery

## 2015-12-27 ENCOUNTER — Emergency Department: Payer: Self-pay

## 2015-12-27 ENCOUNTER — Encounter: Payer: Self-pay | Admitting: Emergency Medicine

## 2015-12-27 DIAGNOSIS — Y92003 Bedroom of unspecified non-institutional (private) residence as the place of occurrence of the external cause: Secondary | ICD-10-CM

## 2015-12-27 DIAGNOSIS — K760 Fatty (change of) liver, not elsewhere classified: Secondary | ICD-10-CM | POA: Diagnosis present

## 2015-12-27 DIAGNOSIS — I7300 Raynaud's syndrome without gangrene: Secondary | ICD-10-CM | POA: Diagnosis present

## 2015-12-27 DIAGNOSIS — L930 Discoid lupus erythematosus: Secondary | ICD-10-CM | POA: Diagnosis present

## 2015-12-27 DIAGNOSIS — R262 Difficulty in walking, not elsewhere classified: Secondary | ICD-10-CM

## 2015-12-27 DIAGNOSIS — F419 Anxiety disorder, unspecified: Secondary | ICD-10-CM | POA: Diagnosis present

## 2015-12-27 DIAGNOSIS — W010XXA Fall on same level from slipping, tripping and stumbling without subsequent striking against object, initial encounter: Secondary | ICD-10-CM | POA: Diagnosis present

## 2015-12-27 DIAGNOSIS — M3500 Sicca syndrome, unspecified: Secondary | ICD-10-CM | POA: Diagnosis present

## 2015-12-27 DIAGNOSIS — Z833 Family history of diabetes mellitus: Secondary | ICD-10-CM

## 2015-12-27 DIAGNOSIS — Z79899 Other long term (current) drug therapy: Secondary | ICD-10-CM

## 2015-12-27 DIAGNOSIS — W19XXXA Unspecified fall, initial encounter: Secondary | ICD-10-CM

## 2015-12-27 DIAGNOSIS — Z9889 Other specified postprocedural states: Secondary | ICD-10-CM

## 2015-12-27 DIAGNOSIS — Z419 Encounter for procedure for purposes other than remedying health state, unspecified: Secondary | ICD-10-CM

## 2015-12-27 DIAGNOSIS — E039 Hypothyroidism, unspecified: Secondary | ICD-10-CM | POA: Diagnosis present

## 2015-12-27 DIAGNOSIS — S82431A Displaced oblique fracture of shaft of right fibula, initial encounter for closed fracture: Secondary | ICD-10-CM | POA: Diagnosis present

## 2015-12-27 DIAGNOSIS — R945 Abnormal results of liver function studies: Secondary | ICD-10-CM | POA: Diagnosis present

## 2015-12-27 DIAGNOSIS — M7989 Other specified soft tissue disorders: Secondary | ICD-10-CM | POA: Diagnosis present

## 2015-12-27 DIAGNOSIS — S82401A Unspecified fracture of shaft of right fibula, initial encounter for closed fracture: Secondary | ICD-10-CM

## 2015-12-27 DIAGNOSIS — Y908 Blood alcohol level of 240 mg/100 ml or more: Secondary | ICD-10-CM | POA: Diagnosis present

## 2015-12-27 DIAGNOSIS — I10 Essential (primary) hypertension: Secondary | ICD-10-CM | POA: Diagnosis present

## 2015-12-27 DIAGNOSIS — E876 Hypokalemia: Secondary | ICD-10-CM | POA: Diagnosis present

## 2015-12-27 DIAGNOSIS — Z8781 Personal history of (healed) traumatic fracture: Secondary | ICD-10-CM

## 2015-12-27 DIAGNOSIS — S82301A Unspecified fracture of lower end of right tibia, initial encounter for closed fracture: Principal | ICD-10-CM | POA: Diagnosis present

## 2015-12-27 DIAGNOSIS — Z888 Allergy status to other drugs, medicaments and biological substances status: Secondary | ICD-10-CM

## 2015-12-27 DIAGNOSIS — F329 Major depressive disorder, single episode, unspecified: Secondary | ICD-10-CM | POA: Diagnosis present

## 2015-12-27 DIAGNOSIS — F10231 Alcohol dependence with withdrawal delirium: Secondary | ICD-10-CM | POA: Diagnosis present

## 2015-12-27 DIAGNOSIS — S82201A Unspecified fracture of shaft of right tibia, initial encounter for closed fracture: Secondary | ICD-10-CM

## 2015-12-27 HISTORY — DX: Fatty (change of) liver, not elsewhere classified: K76.0

## 2015-12-27 HISTORY — DX: Chronic kidney disease, unspecified: N18.9

## 2015-12-27 HISTORY — DX: Alcohol use, unspecified, uncomplicated: F10.90

## 2015-12-27 HISTORY — DX: Malignant (primary) neoplasm, unspecified: C80.1

## 2015-12-27 HISTORY — DX: Raynaud's syndrome without gangrene: I73.00

## 2015-12-27 HISTORY — DX: Alcohol use, unspecified with withdrawal delirium: F10.931

## 2015-12-27 HISTORY — DX: Alcohol dependence with withdrawal delirium: F10.231

## 2015-12-27 HISTORY — DX: Reserved for concepts with insufficient information to code with codable children: IMO0002

## 2015-12-27 LAB — COMPREHENSIVE METABOLIC PANEL
ALK PHOS: 73 U/L (ref 38–126)
ALT: 65 U/L — AB (ref 14–54)
AST: 153 U/L — ABNORMAL HIGH (ref 15–41)
Albumin: 4.7 g/dL (ref 3.5–5.0)
Anion gap: 12 (ref 5–15)
BILIRUBIN TOTAL: 0.2 mg/dL — AB (ref 0.3–1.2)
BUN: 10 mg/dL (ref 6–20)
CALCIUM: 8.9 mg/dL (ref 8.9–10.3)
CO2: 24 mmol/L (ref 22–32)
CREATININE: 0.56 mg/dL (ref 0.44–1.00)
Chloride: 104 mmol/L (ref 101–111)
GFR calc Af Amer: >60 mL/min (ref >60)
Glucose, Bld: 121 mg/dL — ABNORMAL HIGH (ref 65–99)
Potassium: 3.4 mmol/L — ABNORMAL LOW (ref 3.5–5.1)
SODIUM: 140 mmol/L (ref 135–145)
TOTAL PROTEIN: 8.5 g/dL — AB (ref 6.5–8.1)

## 2015-12-27 LAB — CBC
HEMATOCRIT: 44.2 % (ref 35.0–47.0)
Hemoglobin: 15.2 g/dL (ref 12.0–16.0)
MCH: 34.3 pg — AB (ref 26.0–34.0)
MCHC: 34.3 g/dL (ref 32.0–36.0)
MCV: 99.9 fL (ref 80.0–100.0)
Platelets: 140 10*3/uL — ABNORMAL LOW (ref 150–440)
RBC: 4.42 MIL/uL (ref 3.80–5.20)
RDW: 12.5 % (ref 11.5–14.5)
WBC: 5.2 10*3/uL (ref 3.6–11.0)

## 2015-12-27 LAB — ETHANOL: Alcohol, Ethyl (B): 390 mg/dL (ref <5)

## 2015-12-27 MED ORDER — BOOST / RESOURCE BREEZE PO LIQD
1.0000 | Freq: Three times a day (TID) | ORAL | Status: DC
  Administered 2015-12-27 – 2015-12-30 (×4): 1 via ORAL

## 2015-12-27 MED ORDER — FOLIC ACID 1 MG PO TABS
1.0000 mg | ORAL_TABLET | Freq: Every day | ORAL | Status: DC
  Administered 2015-12-27 – 2015-12-31 (×4): 1 mg via ORAL
  Filled 2015-12-27 (×4): qty 1

## 2015-12-27 MED ORDER — LORAZEPAM 2 MG PO TABS
0.0000 mg | ORAL_TABLET | Freq: Two times a day (BID) | ORAL | Status: AC
  Administered 2015-12-29: 2 mg via ORAL
  Administered 2015-12-29: 4 mg via ORAL
  Filled 2015-12-27 (×2): qty 2
  Filled 2015-12-27: qty 1
  Filled 2015-12-27: qty 2

## 2015-12-27 MED ORDER — ADULT MULTIVITAMIN W/MINERALS CH
1.0000 | ORAL_TABLET | Freq: Every day | ORAL | Status: DC
Start: 2015-12-27 — End: 2015-12-31
  Administered 2015-12-27 – 2015-12-31 (×4): 1 via ORAL
  Filled 2015-12-27 (×4): qty 1

## 2015-12-27 MED ORDER — CEFAZOLIN IN D5W 1 GM/50ML IV SOLN
1.0000 g | Freq: Once | INTRAVENOUS | Status: DC
  Filled 2015-12-27: qty 50

## 2015-12-27 MED ORDER — MAGNESIUM HYDROXIDE 400 MG/5ML PO SUSP: 30.0000 mL | Freq: Every day | ORAL | Status: DC | PRN

## 2015-12-27 MED ORDER — MORPHINE SULFATE (PF) 2 MG/ML IV SOLN
2.0000 mg | Freq: Once | INTRAVENOUS | Status: AC
  Administered 2015-12-27: 2 mg via INTRAVENOUS
  Filled 2015-12-27: qty 1

## 2015-12-27 MED ORDER — ONDANSETRON HCL 4 MG/2ML IJ SOLN
4.0000 mg | Freq: Once | INTRAMUSCULAR | Status: AC
  Administered 2015-12-27: 4 mg via INTRAVENOUS
  Filled 2015-12-27: qty 2

## 2015-12-27 MED ORDER — PANTOPRAZOLE SODIUM 40 MG PO TBEC
40.0000 mg | DELAYED_RELEASE_TABLET | Freq: Two times a day (BID) | ORAL | Status: DC
  Administered 2015-12-27 – 2015-12-31 (×6): 40 mg via ORAL
  Filled 2015-12-27 (×6): qty 1

## 2015-12-27 MED ORDER — LORAZEPAM 2 MG/ML IJ SOLN
1.0000 mg | Freq: Four times a day (QID) | INTRAMUSCULAR | Status: AC | PRN
  Administered 2015-12-27 – 2015-12-29 (×5): 1 mg via INTRAVENOUS
  Filled 2015-12-27 (×5): qty 1

## 2015-12-27 MED ORDER — MORPHINE SULFATE (PF) 2 MG/ML IV SOLN
2.0000 mg | INTRAVENOUS | Status: DC | PRN
  Administered 2015-12-27 – 2015-12-29 (×9): 2 mg via INTRAVENOUS
  Filled 2015-12-27 (×9): qty 1

## 2015-12-27 MED ORDER — HYDROMORPHONE HCL 1 MG/ML IJ SOLN
1.0000 mg | Freq: Once | INTRAMUSCULAR | Status: AC
  Administered 2015-12-27: 1 mg via INTRAVENOUS
  Filled 2015-12-27: qty 1

## 2015-12-27 MED ORDER — OXYCODONE HCL 5 MG PO TABS
5.0000 mg | ORAL_TABLET | ORAL | Status: DC | PRN
  Administered 2015-12-27 – 2015-12-31 (×10): 10 mg via ORAL
  Filled 2015-12-27 (×10): qty 2

## 2015-12-27 MED ORDER — VITAMIN B-1 100 MG PO TABS
100.0000 mg | ORAL_TABLET | Freq: Every day | ORAL | Status: DC
  Administered 2015-12-27 – 2015-12-31 (×4): 100 mg via ORAL
  Filled 2015-12-27 (×4): qty 1

## 2015-12-27 MED ORDER — LORAZEPAM 2 MG PO TABS
0.0000 mg | ORAL_TABLET | Freq: Four times a day (QID) | ORAL | Status: AC
  Administered 2015-12-27: 1 mg via ORAL
  Administered 2015-12-28: 4 mg via ORAL
  Filled 2015-12-27 (×2): qty 1
  Filled 2015-12-27: qty 2

## 2015-12-27 MED ORDER — CEFAZOLIN IN D5W 1 GM/50ML IV SOLN
1.0000 g | Freq: Once | INTRAVENOUS | Status: AC
  Administered 2015-12-28: 1 g via INTRAVENOUS
  Filled 2015-12-27: qty 50

## 2015-12-27 MED ORDER — CEFAZOLIN SODIUM-DEXTROSE 2-4 GM/100ML-% IV SOLN
2.0000 g | INTRAVENOUS | Status: AC
  Administered 2015-12-27: 2 g via INTRAVENOUS
  Filled 2015-12-27: qty 100

## 2015-12-27 MED ORDER — THIAMINE HCL 100 MG/ML IJ SOLN: 100.0000 mg | Freq: Every day | INTRAMUSCULAR | Status: DC

## 2015-12-27 MED ORDER — MORPHINE SULFATE (PF) 2 MG/ML IV SOLN
4.0000 mg | Freq: Once | INTRAVENOUS | Status: AC
  Administered 2015-12-27: 4 mg via INTRAVENOUS

## 2015-12-27 MED ORDER — ONDANSETRON HCL 4 MG/2ML IJ SOLN
4.0000 mg | Freq: Four times a day (QID) | INTRAMUSCULAR | Status: DC | PRN
  Administered 2015-12-27: 4 mg via INTRAVENOUS
  Filled 2015-12-27: qty 2

## 2015-12-27 MED ORDER — ACETAMINOPHEN 325 MG PO TABS: 650.0000 mg | ORAL_TABLET | Freq: Four times a day (QID) | ORAL | Status: DC | PRN

## 2015-12-27 MED ORDER — LORAZEPAM 1 MG PO TABS
1.0000 mg | ORAL_TABLET | Freq: Four times a day (QID) | ORAL | Status: AC | PRN
  Administered 2015-12-27 – 2015-12-29 (×3): 1 mg via ORAL
  Filled 2015-12-27 (×2): qty 1

## 2015-12-27 MED ORDER — POTASSIUM CHLORIDE 10 MEQ/100ML IV SOLN
10.0000 meq | Freq: Once | INTRAVENOUS | Status: DC
  Administered 2015-12-27: 10 meq via INTRAVENOUS
  Filled 2015-12-27: qty 100

## 2015-12-27 MED ORDER — SODIUM CHLORIDE 0.9 % IV SOLN
INTRAVENOUS | Status: DC
  Administered 2015-12-27 – 2015-12-28 (×3): via INTRAVENOUS

## 2015-12-27 MED ORDER — POTASSIUM CHLORIDE CRYS ER 20 MEQ PO TBCR
20.0000 meq | EXTENDED_RELEASE_TABLET | Freq: Two times a day (BID) | ORAL | Status: AC
  Administered 2015-12-27 – 2015-12-29 (×5): 20 meq via ORAL
  Filled 2015-12-27 (×6): qty 1

## 2015-12-27 MED ORDER — MORPHINE SULFATE (PF) 2 MG/ML IV SOLN
INTRAVENOUS | Status: AC
Start: 2015-12-27 — End: 2015-12-27
  Administered 2015-12-27: 4 mg via INTRAVENOUS
  Filled 2015-12-27: qty 2

## 2015-12-27 MED ORDER — ONDANSETRON HCL 4 MG PO TABS
4.0000 mg | ORAL_TABLET | Freq: Four times a day (QID) | ORAL | Status: DC | PRN
  Administered 2015-12-27: 4 mg via ORAL
  Filled 2015-12-27: qty 1

## 2015-12-27 MED ORDER — BISACODYL 10 MG RE SUPP
10.0000 mg | Freq: Every day | RECTAL | Status: DC | PRN
  Filled 2015-12-27: qty 1

## 2015-12-27 MED ORDER — POTASSIUM CHLORIDE 10 MEQ/100ML IV SOLN
10.0000 meq | INTRAVENOUS | Status: AC
  Administered 2015-12-27: 10 meq via INTRAVENOUS
  Filled 2015-12-27 (×2): qty 100

## 2015-12-27 MED ORDER — FLEET ENEMA 7-19 GM/118ML RE ENEM: 1.0000 | ENEMA | Freq: Once | RECTAL | Status: DC | PRN

## 2015-12-27 MED ORDER — ACETAMINOPHEN 650 MG RE SUPP: 650.0000 mg | Freq: Four times a day (QID) | RECTAL | Status: DC | PRN

## 2015-12-27 MED ORDER — SENNOSIDES-DOCUSATE SODIUM 8.6-50 MG PO TABS
1.0000 | ORAL_TABLET | Freq: Two times a day (BID) | ORAL | Status: DC
  Administered 2015-12-27 – 2015-12-31 (×8): 1 via ORAL
  Filled 2015-12-27 (×8): qty 1

## 2015-12-27 NOTE — ED Triage Notes (Signed)
Patient ambulatory to triage with steady gait, without difficulty or distress noted; pt reports slipped and fell PTA injuring right leg

## 2015-12-27 NOTE — Clinical Social Work Note (Signed)
Clinical Social Work Assessment  Patient Details  Name: Chloe [name] MRN: [id number] Date of Birth: September 28, 1975  Date of referral:  12/27/15               Reason for consult:  Substance Use/ETOH Abuse                Permission sought to share information with:  Case Manager Permission granted to share information::  Yes, Verbal Permission Granted  Name::        Agency::     Relationship::     Contact Information:     Housing/Transportation Living arrangements for the past 2 months:  Single Family Home Source of Information:  Patient Patient Interpreter Needed:  None Criminal Activity/Legal Involvement Pertinent to Current Situation/Hospitalization:  No - Comment as needed Significant Relationships:  Significant Other Lives with:  Self Do you feel safe going back to the place where you live?  Yes Need for family participation in patient care:  Yes (Comment)  Care giving concerns:  Patient lives in an apartment in [place] alone.    Social Worker assessment / plan:  Holiday representative (CSW) received consult for "Behavioral health issues impacting hospitalization/discharge." Per RN in progression rounds patient is having surgery for an ankle fracture tomorrow. CSW met with patient to discuss D/C plan. Patient was very pleasant and alert and oriented. Patient was sitting up in the bed watching T.V. CSW introduced self and explained role of CSW department. 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 Patient reported that she is not working right now because she has Lupus. Patient reported that she has no children.Per patient she fractured her ankle at her apartment after her boyfriend dropped her off at 2 am. Per patient she fell due to stepping on her scrubs on the floor and sliding and twisting her leg. Patient reported that she was not using drugs or drinking alcohol at the time of the injury. Patient denied drug use and reported that she drinks  alcohol 2-3 days per week and does not see this as a problem. Patient reported that she feels confident she can go home after surgery because she lives on a first floor apartment and has family and friends that will help her. Patient reported no needs or concerns at this time. CSW will continue to follow and assist as needed.   Employment status:  Temporary, Unemployed Insurance information:  Self Pay (Medicaid Pending) PT Recommendations:  Not assessed at this time Information / Referral to community resources:  Other (Comment Required) (Patient deined needs or conerns at this time. )  Patient/Family's Response to care: Patient feels confident she can go home after surgery.   Patient/Family's Understanding of and Emotional Response to Diagnosis, Current Treatment, and Prognosis:  Patient was very pleasant and optimistic about her surgery. Patient reported that she would prefer to use crutches instead of a wheel chair.   Emotional Assessment Appearance:  Appears stated age Attitude/Demeanor/Rapport:    Affect (typically observed):  Accepting, Adaptable, Pleasant Orientation:  Oriented to Self, Oriented to Place, Oriented to  Time, Oriented to Situation Alcohol / Substance use:  Alcohol Use Psych involvement (Current and /or in the community):  No (Comment)  Discharge Needs  Concerns to be addressed:  Discharge Planning Concerns Readmission within the last 30 days:  No Current discharge risk:  Dependent with Mobility Barriers to Discharge:  Continued Medical Work up   [employer], Veronia Beets,  LCSW 12/27/2015, 1:54 PM

## 2015-12-27 NOTE — ED Notes (Signed)
MD at bedside, MD asked pt if he could speak freely in front of visitor, pt agreed.

## 2015-12-27 NOTE — H&P (Signed)
ORTHOPAEDIC HISTORY AND PHYSICAL  PATIENT NAME: Chloe [name] DOB: 1975/09/05  MRN: [id number]  Chief Complaint: Right ankle pain  HPI: Chloe [name] is a 40 y.o. female who complains of  right ankle pain. She returned to her apartment in the early hours of this morning and tripped in her bedroom, twisting the right leg and striking the right lower leg against a bench. She had the onset of right lower extremity pain with difficulty ambulating. She admits that she had been drinking alcohol with her boyfriend during the evening. She denied any other injuries. She denied any loss of consciousness. She stated that this was a mechanical fall and not associated with any seizure activity.   Past Medical History:  Diagnosis Date  . Alcohol use disorder ([place])   . Alcohol withdrawal delirium ([place])   . Anxiety   . Depression   . Hypertension   . Lupus   . Raynaud's phenomenon   . Seizures ([place])   . Steatosis of liver    Past Surgical History:  Procedure Laterality Date  . BREAST ENHANCEMENT SURGERY     Social History   Social History  . Marital status: Single    Spouse name: N/A  . Number of children: N/A  . Years of education: N/A   Social History Main Topics  . Smoking status: Never Smoker  . Smokeless tobacco: Never Used  . Alcohol use Yes     Comment: 2-3 times a week  . Drug use: No  . Sexual activity: Not Asked   Other Topics Concern  . None   Social History Narrative   Lives alone in an apartment. Boyfriend lives in the same apartment complex.   Family History  Problem Relation Age of Onset  . Alzheimer's disease Maternal Grandfather   . Diabetes Paternal Grandmother    Allergies  Allergen Reactions  . Sumatriptan Anaphylaxis  . Bee Venom    Prior to Admission medications   Medication Sig Start Date End Date Taking? Authorizing Provider  DULoxetine (CYMBALTA) 30 MG capsule Take 1 capsule (30 mg total) by mouth daily. 12/03/15   Lytle Butte, MD  folic acid  (FOLVITE) 1 MG tablet Take 1 tablet (1 mg total) by mouth daily. 12/03/15   Lytle Butte, MD  hydroxychloroquine (PLAQUENIL) 200 MG tablet Take 1 tablet (200 mg total) by mouth daily. 12/03/15   Lytle Butte, MD  levETIRAcetam (KEPPRA) 500 MG tablet Take 1 tablet (500 mg total) by mouth 2 (two) times daily. 12/02/15   Lytle Butte, MD  levothyroxine (SYNTHROID, LEVOTHROID) 25 MCG tablet Take 1 tablet (25 mcg total) by mouth daily before breakfast. 12/03/15   Lytle Butte, MD  thiamine 100 MG tablet Take 1 tablet (100 mg total) by mouth daily. 12/03/15   Lytle Butte, MD   Dg Tibia/fibula Right  Result Date: 12/27/2015 CLINICAL DATA:  40 year old female with fall and right lower extremity trauma. EXAM: RIGHT TIBIA AND FIBULA - 2 VIEW COMPARISON:  None. FINDINGS: There is a displaced spiral fracture of the proximal and mid fibular shaft with anterior displacement of the distal fracture fragment and approximately 6 mm distraction gap. There is a comminuted and displaced oblique fracture of the distal tibia measure approximately 1 cm lateral displacement of the distal fracture fragment. There is no dislocation. The ankle mortise appears intact. There is soft tissue swelling of the right leg. No radiopaque foreign object. IMPRESSION: Fractures of the tibia and fibula as described. Electronically Signed  By: Anner Crete M.D.   On: 12/27/2015 04:16    Positive ROS: All other systems have been reviewed and were otherwise negative with the exception of those mentioned in the HPI and as above.  Physical Exam: General: Alert and alert in no acute distress. HEENT: Atraumatic and normocephalic. Sclera are clear. Extraocular motion is intact. Oropharynx is clear with moist mucosa. Neck: Supple, nontender, good range of motion. No JVD or carotid bruits. Lungs: Clear to auscultation bilaterally. Cardiovascular: Regular rate and rhythm with normal S1 and S2. No murmurs. No gallops or rubs. Pedal pulses are  palpable bilaterally. Homans test is negative bilaterally. No significant pretibial or ankle edema. Abdomen: Soft, nontender, and nondistended. Bowel sounds are present. Skin: No lesions in the area of chief complaint. Mild erythema in an alar distribution Neurologic: Awake, alert, and oriented. Sensory function is grossly intact. Motor strength is felt to be 5 over 5 bilaterally. No clonus or tremor. Good motor coordination. Lymphatic: No axillary or cervical lymphadenopathy  MUSCULOSKELETAL: Good strength, range of motion, stability to the upper extremities. Good range of motion, strength, and stability to the left lower extremity. No knee effusion or swelling to the left ankle. Examination of the right lower extremity shows no tenderness about the hip or knee. No knee effusion. A posterior splint with sugar tong attachment is in place to the right lower extremity. Good clinical alignment is appreciated. Good capillary refill.  Assessment: Right distal tibia fracture Lupus Alcohol abuse (blood alcohol level of 390)  Plan: The findings were discussed in detail with the patient. She will be admitted for pain control and anticipated open reduction and internal fixation of the right distal tibia fracture. Medicine has been consult to due to the patient's complicated medical history and alcohol use disorder. CIWA protocol to be followed. Medical optimization will be necessary prior to surgical intervention.  Chloe [name] P. Holley Bouche M.D.

## 2015-12-27 NOTE — ED Notes (Signed)
Spoke with Pyreddy for pain management, MD advised to give Dilaudid 1mg .

## 2015-12-27 NOTE — ED Notes (Signed)
Attempted to call report

## 2015-12-27 NOTE — ED Notes (Signed)
Attempted to call report x2. House AD notified.

## 2015-12-27 NOTE — Care Management (Signed)
RNCM aware of consult. It is anticipated that patient will have surgery tomorrow. Patient lives in her apartment alone. She states her father, grandmother and sister can help her if needed. Uninsured. Her father helps her with medications. PCP is at [facility]. Application given for [facility]. Following for discharge needs.

## 2015-12-27 NOTE — Consult Note (Signed)
[facility] HOSPITALIST  Medical Consultation  Chloe [name] [id number] DOB: May 27, 1975 DOA: 12/27/2015 PCP: No PCP Per Patient   Requesting physician: Orthopedic Service Date of consultation: 12/27/2015 Reason for consultation: Alcohol Intoxication  CHIEF COMPLAINT:   Chief Complaint  Patient presents with  . Fall    HISTORY OF PRESENT ILLNESS: Chloe [name]  is a 40 y.o. female with a known history of Alcohol abuse, hypertension, lupus erythematosus, Sjogren's syndrome presented to the emergency room after she had a fall around 2 AM this morning. Patient was at her boyfriend's apartment and she was leaving to her apartment when she slipped and fell on her right leg. Patient has pain in the right lower extremity which is aching in nature 6 out of 10 on a scale of 1-10. Patient was evaluated in the emergency room imaging studies of the right lower extremity showed a displaced  comminuted fracture of her right tibia and right fibula. No history of any head injury or any loss of consciousness. No complaints of chest pain, shortness of breath and palpitations. No history of fever or chills or cough. Consultation requested by orthopedic service for evaluation.  PAST MEDICAL HISTORY:   Past Medical History:  Diagnosis Date  . Alcohol use disorder ([place])   . Alcohol withdrawal delirium ([place])   . Anxiety   . Depression   . Hypertension   . Lupus   . Seizures ([place])   . Steatosis of liver     PAST SURGICAL HISTORY: Past Surgical History:  Procedure Laterality Date  . BREAST ENHANCEMENT SURGERY      SOCIAL HISTORY:  Social History  Substance Use Topics  . Smoking status: Never Smoker  . Smokeless tobacco: Never Used  . Alcohol use Yes     Comment: 2-3 times a week    FAMILY HISTORY:  Family History  Problem Relation Age of Onset  . Alzheimer's disease Maternal Grandfather   . Diabetes Paternal Grandmother     DRUG ALLERGIES:  Allergies  Allergen Reactions  . Sumatriptan  Anaphylaxis  . Bee Venom     REVIEW OF SYSTEMS:   CONSTITUTIONAL: No fever, fatigue or weakness.  EYES: No blurred or double vision.  EARS, NOSE, AND THROAT: No tinnitus or ear pain.  RESPIRATORY: No cough, shortness of breath, wheezing or hemoptysis.  CARDIOVASCULAR: No chest pain, orthopnea, edema.  No palpitations GASTROINTESTINAL: No nausea, vomiting, diarrhea or abdominal pain.  GENITOURINARY: No dysuria, hematuria.  ENDOCRINE: No polyuria, nocturia,  HEMATOLOGY: No anemia, easy bruising or bleeding SKIN: No rash or lesion. MUSCULOSKELETAL: Right leg Pain. NEUROLOGIC: No tingling, numbness, weakness.  PSYCHIATRY: No anxiety or depression.   MEDICATIONS AT HOME:  Prior to Admission medications   Medication Sig Start Date End Date Taking? Authorizing Provider  DULoxetine (CYMBALTA) 30 MG capsule Take 1 capsule (30 mg total) by mouth daily. 12/03/15   Lytle Butte, MD  folic acid (FOLVITE) 1 MG tablet Take 1 tablet (1 mg total) by mouth daily. 12/03/15   Lytle Butte, MD  hydroxychloroquine (PLAQUENIL) 200 MG tablet Take 1 tablet (200 mg total) by mouth daily. 12/03/15   Lytle Butte, MD  levETIRAcetam (KEPPRA) 500 MG tablet Take 1 tablet (500 mg total) by mouth 2 (two) times daily. 12/02/15   Lytle Butte, MD  levothyroxine (SYNTHROID, LEVOTHROID) 25 MCG tablet Take 1 tablet (25 mcg total) by mouth daily before breakfast. 12/03/15   Lytle Butte, MD  thiamine 100 MG tablet Take 1 tablet (100 mg total)  by mouth daily. 12/03/15   Lytle Butte, MD      PHYSICAL EXAMINATION:   VITAL SIGNS: Blood pressure (!) 134/101, pulse (!) 107, temperature 98.1 F (36.7 C), resp. rate 18, height 5\' 2"  (1.575 m), weight 59 kg (130 lb), last menstrual period 12/12/2015, SpO2 99 %.  GENERAL:  40 y.o.-year-old patient lying in the bed with no acute distress.  EYES: Pupils equal, round, reactive to light and accommodation. No scleral icterus. Extraocular muscles intact.  HEENT: Head atraumatic,  normocephalic. Oropharynx and nasopharynx clear.  Lupus skin lesion over nose. NECK:  Supple, no jugular venous distention. No thyroid enlargement, no tenderness.  LUNGS: Normal breath sounds bilaterally, no wheezing, rales,rhonchi or crepitation. No use of accessory muscles of respiration.  CARDIOVASCULAR: S1, S2 normal. No murmurs, rubs, or gallops.  ABDOMEN: Soft, nontender, nondistended. Bowel sounds present. No organomegaly or mass.  EXTREMITIES: No pedal edema, cyanosis, or clubbing.  Pain right lower leg  NEUROLOGIC: Cranial nerves II through XII are intact. Muscle strength 5/5 in all extremities. Sensation intact. Gait not checked.  PSYCHIATRIC: The patient is alert and oriented x 3.  SKIN: No obvious rash, lesion, or ulcer.   LABORATORY PANEL:   CBC  Recent Labs Lab 12/27/15 0413  WBC 5.2  HGB 15.2  HCT 44.2  PLT 140*  MCV 99.9  MCH 34.3*  MCHC 34.3  RDW 12.5   ------------------------------------------------------------------------------------------------------------------  Chemistries   Recent Labs Lab 12/27/15 0413  NA 140  K 3.4*  CL 104  CO2 24  GLUCOSE 121*  BUN 10  CREATININE 0.56  CALCIUM 8.9  AST 153*  ALT 65*  ALKPHOS 73  BILITOT 0.2*   ------------------------------------------------------------------------------------------------------------------ estimated creatinine clearance is 74.7 mL/min (by C-G formula based on SCr of 0.56 mg/dL). ------------------------------------------------------------------------------------------------------------------ No results for input(s): TSH, T4TOTAL, T3FREE, THYROIDAB in the last 72 hours.  Invalid input(s): FREET3   Coagulation profile No results for input(s): INR, PROTIME in the last 168 hours. ------------------------------------------------------------------------------------------------------------------- No results for input(s): DDIMER in the last 72  hours. -------------------------------------------------------------------------------------------------------------------  Cardiac Enzymes No results for input(s): CKMB, TROPONINI, MYOGLOBIN in the last 168 hours.  Invalid input(s): CK ------------------------------------------------------------------------------------------------------------------ Invalid input(s): POCBNP  ---------------------------------------------------------------------------------------------------------------  Urinalysis No results found for: COLORURINE, APPEARANCEUR, LABSPEC, PHURINE, GLUCOSEU, HGBUR, BILIRUBINUR, KETONESUR, PROTEINUR, UROBILINOGEN, NITRITE, LEUKOCYTESUR   RADIOLOGY: Dg Tibia/fibula Right  Result Date: 12/27/2015 CLINICAL DATA:  40 year old female with fall and right lower extremity trauma. EXAM: RIGHT TIBIA AND FIBULA - 2 VIEW COMPARISON:  None. FINDINGS: There is a displaced spiral fracture of the proximal and mid fibular shaft with anterior displacement of the distal fracture fragment and approximately 6 mm distraction gap. There is a comminuted and displaced oblique fracture of the distal tibia measure approximately 1 cm lateral displacement of the distal fracture fragment. There is no dislocation. The ankle mortise appears intact. There is soft tissue swelling of the right leg. No radiopaque foreign object. IMPRESSION: Fractures of the tibia and fibula as described. Electronically Signed   By: Anner Crete M.D.   On: 12/27/2015 04:16    EKG: No orders found for this or any previous visit.  IMPRESSION AND PLAN: 40 year old female patient with history of alcohol abuse, lupus erythematosus, Sjogren syndrome, hypertension, fatty liver disease presented to the emergency room after she had a fall this morning. Workup in the emergency room showed right tibia and fibula fracture. Orthopedic service consulted [place] for medical management of patient with alcohol intoxication. Assessment 1.  Accidental fall 2. Right tibia-fibula fracture  3. Alcohol abuse 4. Alcohol intoxication 5. Lupus erythematosus 6. Hypertension 7. Hypokalemia Treatment plan Potassium supplementation Pain management ciwa protocol for alcohol withdrawal Monitor electrolytes Thank you for the consultation will follow up the patient. All the records are reviewed and case discussed with ED provider. Management plans discussed with the patient, family and they are in agreement.  CODE STATUS:FULL Code Status History    Date Active Date Inactive Code Status Order ID Comments User Context   11/30/2015  5:52 PM 12/02/2015  7:36 PM Full Code [id number]  Lytle Butte, MD Inpatient   11/27/2015  3:30 PM 11/30/2015  4:39 PM Full Code [id number]  Gonzella Lex, MD Inpatient   11/26/2015  1:53 PM 11/27/2015  1:54 PM Full Code [id number]  Eula Listen, MD ED       TOTAL TIME TAKING CARE OF THIS PATIENT: 50 minutes.    Saundra Shelling M.D on 12/27/2015 at 6:47 AM  Between 7am to 6pm - Pager - [phone]  After 6pm go to [email or web address] - password [email or web address] [facility]  [facility]  Office  [phone]  CC: Primary care physician; No PCP Per Patient

## 2015-12-27 NOTE — ED Provider Notes (Signed)
[facility] Emergency Department Provider Note   First MD Initiated Contact with Patient 12/27/15 [phone]     (approximate)  I have reviewed the triage vital signs and the nursing notes.   HISTORY  Chief Complaint Fall    HPI Anaalicia Goodin is a 40 y.o. female presents with history of axonal slip and fall with resultant right leg pain and swelling that occurred prior to arrival to the emergency department. Patient states that her current pain score 6 out of 10. Patient denies any head injury no loss of consciousness. Patient admits to drinking one alcoholic beverage tonight.   Past Medical History:  Diagnosis Date  . Anxiety   . Depression   . Hypertension   . Lupus   . Seizures [facility])     Patient Active Problem List   Diagnosis Date Noted  . Alcohol withdrawal delirium ([place]) 11/30/2015  . Alcohol withdrawal ([place]) 11/30/2015  . Adjustment disorder with mixed anxiety and depressed mood 11/27/2015  . Alcohol use disorder, severe, dependence ([place]) 11/26/2015  . Substance induced mood disorder ([place]) 11/26/2015  . Lupus 11/26/2015  . Seizures ([place]) 11/26/2015    Past Surgical History:  Procedure Laterality Date  . BREAST ENHANCEMENT SURGERY      Prior to Admission medications   Medication Sig Start Date End Date Taking? Authorizing Provider  DULoxetine (CYMBALTA) 30 MG capsule Take 1 capsule (30 mg total) by mouth daily. 12/03/15   Lytle Butte, MD  folic acid (FOLVITE) 1 MG tablet Take 1 tablet (1 mg total) by mouth daily. 12/03/15   Lytle Butte, MD  hydroxychloroquine (PLAQUENIL) 200 MG tablet Take 1 tablet (200 mg total) by mouth daily. 12/03/15   Lytle Butte, MD  levETIRAcetam (KEPPRA) 500 MG tablet Take 1 tablet (500 mg total) by mouth 2 (two) times daily. 12/02/15   Lytle Butte, MD  levothyroxine (SYNTHROID, LEVOTHROID) 25 MCG tablet Take 1 tablet (25 mcg total) by mouth daily before breakfast. 12/03/15   Lytle Butte, MD  thiamine 100 MG  tablet Take 1 tablet (100 mg total) by mouth daily. 12/03/15   Lytle Butte, MD    Allergies Sumatriptan  Family History  Problem Relation Age of Onset  . Diabetes Neg Hx     Social History Social History  Substance Use Topics  . Smoking status: Never Smoker  . Smokeless tobacco: Never Used  . Alcohol use Yes     Comment: occasional    Review of Systems Constitutional: No fever/chills Eyes: No visual changes. ENT: No sore throat. Cardiovascular: Denies chest pain. Respiratory: Denies shortness of breath. Gastrointestinal: No abdominal pain.  No nausea, no vomiting.  No diarrhea.  No constipation. Genitourinary: Negative for dysuria. Musculoskeletal: Negative for back pain. Skin: Negative for rash. Neurological: Negative for headaches, focal weakness or numbness.  10-point ROS otherwise negative.  ____________________________________________   PHYSICAL EXAM:  VITAL SIGNS: ED Triage Vitals  Enc Vitals Group     BP 12/27/15 0328 (!) 142/100     Pulse Rate 12/27/15 0328 (!) 109     Resp 12/27/15 0328 18     Temp 12/27/15 0328 98.1 F (36.7 C)     Temp src --      SpO2 12/27/15 0328 99 %     Weight 12/27/15 0324 130 lb (59 kg)     Height 12/27/15 0324 5\' 2"  (1.575 m)     Head Circumference --      Peak Flow --  Pain Score 12/27/15 0324 5     Pain Loc --      Pain Edu? --      Excl. in [place]? --     Constitutional: Alert and oriented. Well appearing and in no acute distress.Appears intoxicated Eyes: Conjunctivae are normal. PERRL. EOMI. Head: Atraumatic. Mouth/Throat: Mucous membranes are moist.  Oropharynx non-erythematous. Neck: No stridor.  No meningeal signs.  No cervical spine tenderness to palpation. Cardiovascular: Normal rate, regular rhythm. Good peripheral circulation. Grossly normal heart sounds. Respiratory: Normal respiratory effort.  No retractions. Lungs CTAB. Gastrointestinal: Soft and nontender. No distention.  Musculoskeletal: No lower  extremity tenderness nor edema. No gross deformities of extremities. Neurologic:  Normal speech and language. No gross focal neurologic deficits are appreciated.  Skin:  Skin is warm, dry and intact. No rash noted. Psychiatric: Mood and affect are normal. Speech and behavior are normal.  ____________________________________________   LABS (all labs ordered are listed, but only abnormal results are displayed)  Labs Reviewed  CBC - Abnormal; Notable for the following:       Result Value   MCH 34.3 (*)    Platelets 140 (*)    All other components within normal limits  COMPREHENSIVE METABOLIC PANEL - Abnormal; Notable for the following:    Potassium 3.4 (*)    Glucose, Bld 121 (*)    Total Protein 8.5 (*)    AST 153 (*)    ALT 65 (*)    Total Bilirubin 0.2 (*)    All other components within normal limits  ETHANOL - Abnormal; Notable for the following:    Alcohol, Ethyl (B) 390 (*)    All other components within normal limits   ___________________ RADIOLOGY I, [place] Lam[place], personally viewed and evaluated these images (plain radiographs) as part of my medical decision making, as well as reviewing the written report by the radiologist.  Dg Tibia/fibula Right  Result Date: 12/27/2015 CLINICAL DATA:  40 year old female with fall and right lower extremity trauma. EXAM: RIGHT TIBIA AND FIBULA - 2 VIEW COMPARISON:  None. FINDINGS: There is a displaced spiral fracture of the proximal and mid fibular shaft with anterior displacement of the distal fracture fragment and approximately 6 mm distraction gap. There is a comminuted and displaced oblique fracture of the distal tibia measure approximately 1 cm lateral displacement of the distal fracture fragment. There is no dislocation. The ankle mortise appears intact. There is soft tissue swelling of the right leg. No radiopaque foreign object. IMPRESSION: Fractures of the tibia and fibula as described. Electronically Signed   By: Anner Crete M.D.   On: 12/27/2015 04:16     Procedures      INITIAL IMPRESSION / ASSESSMENT AND PLAN / ED COURSE  Pertinent labs & imaging results that were available during my care of the patient were reviewed by me and considered in my medical decision making (see chart for details).  Patient received IV morphine with pain improvement. After reviewing the x-ray patient discussed with Dr. Marry Guan orthopedic surgeon on call who will admit the patient. Following conversation Dr. Marry Guan Dr. Estanislado Pandy paged for consultation. Awaiting his response  Posterior sugar tong splint applied to the right leg  Clinical Course    ____________________________________________  FINAL CLINICAL IMPRESSION(S) / ED DIAGNOSES  Final diagnoses:  Fall  Tibia/fibula fracture, right, closed, initial encounter     MEDICATIONS GIVEN DURING THIS VISIT:  Medications  morphine 2 MG/ML injection 2 mg (2 mg Intravenous Given 12/27/15 0421)  ondansetron (  ZOFRAN) injection 4 mg (4 mg Intravenous Given 12/27/15 0422)  morphine 2 MG/ML injection 4 mg (4 mg Intravenous Given 12/27/15 0447)     NEW OUTPATIENT MEDICATIONS STARTED DURING THIS VISIT:  New Prescriptions   No medications on file    Modified Medications   No medications on file    Discontinued Medications   No medications on file     Note:  This document was prepared using Dragon voice recognition software and may include unintentional dictation errors.    Gregor Hams, MD 12/27/15 [phone]

## 2015-12-27 NOTE — Progress Notes (Signed)
Initial Nutrition Assessment  DOCUMENTATION CODES:   Not applicable  INTERVENTION:  -Boost Breeze po TID, each supplement provides 250 kcal and 9 grams of protein  NUTRITION DIAGNOSIS:   Inadequate oral intake related to poor appetite, chronic illness, acute illness as evidenced by per patient/family report.  GOAL:   Patient will meet greater than or equal to 90% of their needs  MONITOR:   PO intake, I & O's, Labs, Weight trends, Supplement acceptance  REASON FOR ASSESSMENT:   Malnutrition Screening Tool    ASSESSMENT:   Chloe [name] is a 40 y.o. female who complains of  right ankle pain. She returned to her apartment in the early hours of this morning and tripped in her bedroom, twisting the right leg and striking the right lower leg against a bench  Ms. Batton is a pleasant 40 yo woman who who admits to poor appetite during stay, states she is not very hungry.  She also admits to fluctuating appetite at home related to depression medication. Pt has a hx of Lupus, states the medications she was taking for that made her gain about 20# She states she has lost a few pounds recently, but "nothing drastic," she is unsure of her usual weight "I don't have a scale at home." Labs and medications reviewed: Banana Bag, K+ NS @ 13mL/hr  Diet Order:  Diet regular Room service appropriate? Yes; Fluid consistency: Thin  Skin:  Reviewed, no issues  Last BM:  12/26/2015  Height:   Ht Readings from Last 1 Encounters:  12/27/15 5\' 2"  (1.575 m)    Weight:   Wt Readings from Last 1 Encounters:  12/27/15 130 lb (59 kg)    Ideal Body Weight:  50 kg  BMI:  Body mass index is 23.78 kg/m.  Estimated Nutritional Needs:   Kcal:  1600-2000 calories  Protein:  60-71 gm  Fluid:  >/= 1.6L  EDUCATION NEEDS:   No education needs identified at this time  [name]. Nery Kalisz, MS, RD LDN Inpatient Clinical Dietitian Pager [phone]

## 2015-12-27 NOTE — Progress Notes (Signed)
[place] [facility] [place] NAME: Chloe [name]    MR#:  [id number]  DATE OF BIRTH:  February 20, 1976  SUBJECTIVE:   Patient here after a mechanical fall and noted to have a right tib-fib fracture. No drinking mildly hypokalemic. Alcohol level elevated but no clinical evidence of alcohol withdrawal presently.  REVIEW OF SYSTEMS:    Review of Systems  Constitutional: Negative for chills and fever.  HENT: Negative for congestion and tinnitus.   Eyes: Negative for blurred vision and double vision.  Respiratory: Negative for cough, shortness of breath and wheezing.   Cardiovascular: Negative for chest pain, orthopnea and PND.  Gastrointestinal: Negative for abdominal pain, diarrhea, nausea and vomiting.  Genitourinary: Negative for dysuria and hematuria.  Musculoskeletal: Positive for falls and joint pain (right ankle pain).  Neurological: Negative for dizziness, sensory change and focal weakness.  All other systems reviewed and are negative.   Nutrition: regular Tolerating Diet: Yes Tolerating PT: Await eval post-operatively   DRUG ALLERGIES:   Allergies  Allergen Reactions  . Sumatriptan Anaphylaxis  . Bee Venom     VITALS:  Blood pressure (!) 150/102, pulse (!) 104, temperature 98.9 F (37.2 C), temperature source Oral, resp. rate 18, height 5\' 2"  (1.575 m), weight 59 kg (130 lb), last menstrual period 12/12/2015, SpO2 98 %.  PHYSICAL EXAMINATION:   Physical Exam  GENERAL:  40 y.o.-year-old patient lying in the bed in no acute distress.  EYES: Pupils equal, round, reactive to light and accommodation. No scleral icterus. Extraocular muscles intact.  HEENT: Head atraumatic, normocephalic. Oropharynx and nasopharynx clear.  NECK:  Supple, no jugular venous distention. No thyroid enlargement, no tenderness.  LUNGS: Normal breath sounds bilaterally, no wheezing, rales, rhonchi. No use of accessory muscles of respiration.  CARDIOVASCULAR: S1,  S2 normal. No murmurs, rubs, or gallops.  ABDOMEN: Soft, nontender, nondistended. Bowel sounds present. No organomegaly or mass.  EXTREMITIES: No cyanosis, clubbing or edema b/l.   Right ankle in cast.   NEUROLOGIC: Cranial nerves II through XII are intact. No focal Motor or sensory deficits b/l.   PSYCHIATRIC: The patient is alert and oriented x 3.  SKIN: No obvious rash, lesion, or ulcer.    LABORATORY PANEL:   CBC  Recent Labs Lab 12/27/15 0413  WBC 5.2  HGB 15.2  HCT 44.2  PLT 140*   ------------------------------------------------------------------------------------------------------------------  Chemistries   Recent Labs Lab 12/27/15 0413  NA 140  K 3.4*  CL 104  CO2 24  GLUCOSE 121*  BUN 10  CREATININE 0.56  CALCIUM 8.9  AST 153*  ALT 65*  ALKPHOS 73  BILITOT 0.2*   ------------------------------------------------------------------------------------------------------------------  Cardiac Enzymes No results for input(s): TROPONINI in the last 168 hours. ------------------------------------------------------------------------------------------------------------------  RADIOLOGY:  Dg Tibia/fibula Right  Result Date: 12/27/2015 CLINICAL DATA:  40 year old female with fall and right lower extremity trauma. EXAM: RIGHT TIBIA AND FIBULA - 2 VIEW COMPARISON:  None. FINDINGS: There is a displaced spiral fracture of the proximal and mid fibular shaft with anterior displacement of the distal fracture fragment and approximately 6 mm distraction gap. There is a comminuted and displaced oblique fracture of the distal tibia measure approximately 1 cm lateral displacement of the distal fracture fragment. There is no dislocation. The ankle mortise appears intact. There is soft tissue swelling of the right leg. No radiopaque foreign object. IMPRESSION: Fractures of the tibia and fibula as described. Electronically Signed   By: Anner Crete M.D.   On: 12/27/2015 04:16  ASSESSMENT AND PLAN:   40 year old female with past medical history of alcohol abuse, fatty liver disease, history of seizures, lupus, hypertension, depression/anxiety who presents to the hospital after mechanical fall and noted to have a right ankle fracture.   1. Preoperative medical evaluation-patient is a low risk for noncardiac surgery. -No acute contraindications surgery at this time.  2. Hypokalemia-Place on oral potassium supplements and repeat level in the morning.  3. Status post fall and right ankle fracture-orthopedics has been consulted. Continue care as per them. -Patient likely to the OR tomorrow.  4. Alcohol abuse-high risk for alcohol withdrawal. -Patient placed on CIWA protocol. Continue thiamine, folate.  5. Abnormal LFTs-secondary to alcohol abuse. - improved since last month.    All the records are reviewed and case discussed with Care Management/Social Worker. Management plans discussed with the patient, family and they are in agreement.  CODE STATUS: Full code  DVT Prophylaxis: TEd's & SCD's  TOTAL TIME TAKING CARE OF THIS PATIENT: 30 minutes.   POSSIBLE D/C IN 2-3 DAYS, DEPENDING ON CLINICAL CONDITION.   Henreitta Leber M.D on 12/27/2015 at 12:22 PM  Between 7am to 6pm - Pager - [phone]  After 6pm go to [email or web address] - Technical brewer [facility]  Office  [phone]  CC: Primary care physician; No PCP Per Patient

## 2015-12-28 ENCOUNTER — Inpatient Hospital Stay: Payer: Self-pay | Admitting: Anesthesiology

## 2015-12-28 ENCOUNTER — Inpatient Hospital Stay: Payer: Self-pay

## 2015-12-28 ENCOUNTER — Encounter
Admission: EM | Disposition: A | Discharge status: Home / Self Care | Payer: Self-pay | Source: Home / Self Care | Attending: Orthopedic Surgery

## 2015-12-28 ENCOUNTER — Encounter: Payer: Self-pay | Admitting: *Deleted

## 2015-12-28 HISTORY — PX: ORIF TIBIA FRACTURE: SHX5416

## 2015-12-28 LAB — URINE DRUG SCREEN, QUALITATIVE (ARMC ONLY)
Amphetamines, Ur Screen: NOT DETECTED
BARBITURATES, UR SCREEN: NOT DETECTED
BENZODIAZEPINE, UR SCRN: POSITIVE — AB
CANNABINOID 50 NG, UR ~~LOC~~: NOT DETECTED
Cocaine Metabolite,Ur ~~LOC~~: NOT DETECTED
MDMA (Ecstasy)Ur Screen: NOT DETECTED
METHADONE SCREEN, URINE: NOT DETECTED
OPIATE, UR SCREEN: POSITIVE — AB
PHENCYCLIDINE (PCP) UR S: NOT DETECTED
Tricyclic, Ur Screen: NOT DETECTED

## 2015-12-28 LAB — COMPREHENSIVE METABOLIC PANEL
ALBUMIN: 3.9 g/dL (ref 3.5–5.0)
ALK PHOS: 63 U/L (ref 38–126)
ALT: 49 U/L (ref 14–54)
ANION GAP: 6 (ref 5–15)
AST: 89 U/L — ABNORMAL HIGH (ref 15–41)
BILIRUBIN TOTAL: 0.6 mg/dL (ref 0.3–1.2)
BUN: 9 mg/dL (ref 6–20)
CALCIUM: 8.1 mg/dL — AB (ref 8.9–10.3)
CO2: 29 mmol/L (ref 22–32)
Chloride: 102 mmol/L (ref 101–111)
Creatinine, Ser: 0.59 mg/dL (ref 0.44–1.00)
GFR calc non Af Amer: >60 mL/min (ref >60)
GLUCOSE: 110 mg/dL — AB (ref 65–99)
Potassium: 4.1 mmol/L (ref 3.5–5.1)
Sodium: 137 mmol/L (ref 135–145)
TOTAL PROTEIN: 7.2 g/dL (ref 6.5–8.1)

## 2015-12-28 LAB — ETHANOL: Alcohol, Ethyl (B): <5 mg/dL (ref <5)

## 2015-12-28 LAB — PREGNANCY, URINE: Preg Test, Ur: NEGATIVE

## 2015-12-28 SURGERY — OPEN REDUCTION INTERNAL FIXATION (ORIF) TIBIA FRACTURE
Anesthesia: Spinal | Site: Ankle | Laterality: Right | Wound class: Clean

## 2015-12-28 MED ORDER — PHENYLEPHRINE HCL 10 MG/ML IJ SOLN
INTRAMUSCULAR | Status: DC | PRN
  Administered 2015-12-28 (×2): 100 ug via INTRAVENOUS
  Administered 2015-12-28: 50 ug via INTRAVENOUS
  Administered 2015-12-28: 100 ug via INTRAVENOUS

## 2015-12-28 MED ORDER — LEVOTHYROXINE SODIUM 50 MCG PO TABS
25.0000 ug | ORAL_TABLET | Freq: Every day | ORAL | Status: DC
  Administered 2015-12-29 – 2015-12-31 (×3): 25 ug via ORAL
  Filled 2015-12-28 (×3): qty 1

## 2015-12-28 MED ORDER — SODIUM CHLORIDE 0.9 % IV SOLN
INTRAVENOUS | Status: DC
  Administered 2015-12-28: 17:00:00 via INTRAVENOUS

## 2015-12-28 MED ORDER — BUPIVACAINE IN DEXTROSE 0.75-8.25 % IT SOLN
INTRATHECAL | Status: DC | PRN
  Administered 2015-12-28: 2 mL via INTRATHECAL

## 2015-12-28 MED ORDER — METOCLOPRAMIDE HCL 10 MG PO TABS: 5.0000 mg | ORAL_TABLET | Freq: Three times a day (TID) | ORAL | Status: DC | PRN

## 2015-12-28 MED ORDER — FENTANYL CITRATE (PF) 100 MCG/2ML IJ SOLN
INTRAMUSCULAR | Status: DC | PRN
  Administered 2015-12-28 (×2): 50 ug via INTRAVENOUS

## 2015-12-28 MED ORDER — ONDANSETRON HCL 4 MG/2ML IJ SOLN: 4.0000 mg | Freq: Once | INTRAMUSCULAR | Status: DC | PRN

## 2015-12-28 MED ORDER — CEFAZOLIN IN D5W 1 GM/50ML IV SOLN
1.0000 g | Freq: Four times a day (QID) | INTRAVENOUS | Status: AC
  Administered 2015-12-28 – 2015-12-29 (×3): 1 g via INTRAVENOUS
  Filled 2015-12-28 (×3): qty 50

## 2015-12-28 MED ORDER — SODIUM CHLORIDE 0.9 % IV SOLN
INTRAVENOUS | Status: DC | PRN
  Administered 2015-12-28: 10 ug/min via INTRAVENOUS

## 2015-12-28 MED ORDER — ENOXAPARIN SODIUM 40 MG/0.4ML ~~LOC~~ SOLN
40.0000 mg | SUBCUTANEOUS | Status: DC
  Administered 2015-12-29 – 2015-12-31 (×3): 40 mg via SUBCUTANEOUS
  Filled 2015-12-28 (×3): qty 0.4

## 2015-12-28 MED ORDER — PROPOFOL 500 MG/50ML IV EMUL
INTRAVENOUS | Status: DC | PRN
  Administered 2015-12-28: 100 ug/kg/min via INTRAVENOUS

## 2015-12-28 MED ORDER — FUROSEMIDE 40 MG PO TABS
40.0000 mg | ORAL_TABLET | Freq: Every day | ORAL | Status: DC
  Administered 2015-12-29 – 2015-12-31 (×3): 40 mg via ORAL
  Filled 2015-12-28 (×3): qty 1

## 2015-12-28 MED ORDER — NEOMYCIN-POLYMYXIN B GU 40-200000 IR SOLN
Status: DC | PRN
  Administered 2015-12-28: 4 mL

## 2015-12-28 MED ORDER — HYDROXYCHLOROQUINE SULFATE 200 MG PO TABS
200.0000 mg | ORAL_TABLET | Freq: Every day | ORAL | Status: DC
  Administered 2015-12-29 – 2015-12-31 (×3): 200 mg via ORAL
  Filled 2015-12-28 (×3): qty 1

## 2015-12-28 MED ORDER — TETRACAINE HCL 1 % IJ SOLN
INTRAMUSCULAR | Status: DC | PRN
  Administered 2015-12-28: 10 mg via INTRASPINAL

## 2015-12-28 MED ORDER — FENTANYL CITRATE (PF) 100 MCG/2ML IJ SOLN: 25.0000 ug | INTRAMUSCULAR | Status: DC | PRN

## 2015-12-28 MED ORDER — LACTATED RINGERS IV SOLN
INTRAVENOUS | Status: DC
  Administered 2015-12-28: 125 mL/h via INTRAVENOUS
  Administered 2015-12-28: 15:00:00 via INTRAVENOUS

## 2015-12-28 MED ORDER — MIDAZOLAM HCL 5 MG/5ML IJ SOLN
INTRAMUSCULAR | Status: DC | PRN
  Administered 2015-12-28: 2 mg via INTRAVENOUS
  Administered 2015-12-28 (×2): 1 mg via INTRAVENOUS

## 2015-12-28 MED ORDER — FOLIC ACID 1 MG PO TABS: 1.0000 mg | ORAL_TABLET | Freq: Every day | ORAL | Status: DC

## 2015-12-28 MED ORDER — METOCLOPRAMIDE HCL 5 MG/ML IJ SOLN: 5.0000 mg | Freq: Three times a day (TID) | INTRAMUSCULAR | Status: DC | PRN

## 2015-12-28 MED ORDER — DULOXETINE HCL 30 MG PO CPEP
30.0000 mg | ORAL_CAPSULE | Freq: Every day | ORAL | Status: DC
  Administered 2015-12-29 – 2015-12-31 (×3): 30 mg via ORAL
  Filled 2015-12-28 (×3): qty 1

## 2015-12-28 SURGICAL SUPPLY — 50 items
3.5x24 multidirectional screw ×3 IMPLANT
BIT DRILL 2.5X2.75 QC CALB (BIT) ×3 IMPLANT
BIT DRILL CALIBRATED 2.7 (BIT) ×2 IMPLANT
BIT DRILL CALIBRATED 2.7MM (BIT) ×1
CANISTER SUCT 1200ML W/VALVE (MISCELLANEOUS) ×3 IMPLANT
CATH TRAY 16F METER LATEX (MISCELLANEOUS) ×3 IMPLANT
CHLORAPREP W/TINT 26ML (MISCELLANEOUS) ×3 IMPLANT
CUFF TOURN 24 STER (MISCELLANEOUS) IMPLANT
CUFF TOURN 30 STER DUAL PORT (MISCELLANEOUS) IMPLANT
DRAPE C-ARM XRAY 36X54 (DRAPES) ×3 IMPLANT
DRAPE C-ARMOR (DRAPES) ×3 IMPLANT
DRAPE SHEET LG 3/4 BI-LAMINATE (DRAPES) ×3 IMPLANT
DRSG EMULSION OIL 3X8 NADH (GAUZE/BANDAGES/DRESSINGS) ×3 IMPLANT
ELECT CAUTERY BLADE 6.4 (BLADE) ×3 IMPLANT
ELECT REM PT RETURN 9FT ADLT (ELECTROSURGICAL) ×3
ELECTRODE REM PT RTRN 9FT ADLT (ELECTROSURGICAL) ×1 IMPLANT
GAUZE SPONGE 4X4 12PLY STRL (GAUZE/BANDAGES/DRESSINGS) ×3 IMPLANT
GLOVE BIO SURGEON STRL SZ7.5 (GLOVE) ×3 IMPLANT
GLOVE BIOGEL PI IND STRL 9 (GLOVE) ×1 IMPLANT
GLOVE BIOGEL PI INDICATOR 9 (GLOVE) ×2
GLOVE SURG SYN 9.0  PF PI (GLOVE) ×2
GLOVE SURG SYN 9.0 PF PI (GLOVE) ×1 IMPLANT
GOWN SRG 2XL LVL 4 RGLN SLV (GOWNS) ×1 IMPLANT
GOWN STRL NON-REIN 2XL LVL4 (GOWNS) ×2
GOWN STRL REUS W/ TWL LRG LVL3 (GOWN DISPOSABLE) ×2 IMPLANT
GOWN STRL REUS W/TWL LRG LVL3 (GOWN DISPOSABLE) ×4
HEMOVAC 400CC 10FR (MISCELLANEOUS) ×3 IMPLANT
K-WIRE ACE 1.6X6 (WIRE) ×3
KIT RM TURNOVER STRD PROC AR (KITS) ×3 IMPLANT
KWIRE ACE 1.6X6 (WIRE) ×1 IMPLANT
NEEDLE FILTER BLUNT 18X 1/2SAF (NEEDLE) ×2
NEEDLE FILTER BLUNT 18X1 1/2 (NEEDLE) ×1 IMPLANT
NS IRRIG 1000ML POUR BTL (IV SOLUTION) ×3 IMPLANT
PACK TOTAL KNEE (MISCELLANEOUS) ×3 IMPLANT
PAD PREP 24X41 OB/GYN DISP (PERSONAL CARE ITEMS) ×3 IMPLANT
PLATE 9H 184 RT MED DIST TIB (Plate) ×3 IMPLANT
SCREW CORT 3.5X40 (Screw) ×3 IMPLANT
SCREW CORTICAL 3.5MM 24MM (Screw) ×3 IMPLANT
SCREW CORTICAL 3.5MM 26MM (Screw) ×9 IMPLANT
SCREW LOCK 3.5X32 DIST TIB (Orthopedic Implant) ×6 IMPLANT
SCREW LOCK CORT STAR 3.5X30 (Screw) ×6 IMPLANT
SCREW LOCK CORT STAR 3.5X36 (Screw) ×3 IMPLANT
SCREW LOCK CORT STAR 3.5X38 (Screw) ×6 IMPLANT
STAPLER SKIN PROX 35W (STAPLE) ×3 IMPLANT
STRAP SAFETY BODY (MISCELLANEOUS) ×3 IMPLANT
SUT VIC AB 2-0 SH 27 (SUTURE) ×2
SUT VIC AB 2-0 SH 27XBRD (SUTURE) ×1 IMPLANT
SUT VICRYL 1-0 27IN ABS (SUTURE) ×3
SUTURE VICRYL 1-0 27IN ABS (SUTURE) ×1 IMPLANT
SYR 5ML LL (SYRINGE) ×3 IMPLANT

## 2015-12-28 NOTE — Anesthesia Preprocedure Evaluation (Signed)
Anesthesia Evaluation  Patient identified by MRN, date of birth, ID band Patient awake    Reviewed: Allergy & Precautions  Airway Mallampati: II       Dental  (+) Teeth Intact   Pulmonary neg pulmonary ROS,    breath sounds clear to auscultation       Cardiovascular hypertension, Pt. on medications  Rhythm:Regular     Neuro/Psych Seizures -, Well Controlled,  Anxiety Depression    GI/Hepatic negative GI ROS, Neg liver ROS, (+)     substance abuse  alcohol use,   Endo/Other  negative endocrine ROS  Renal/GU      Musculoskeletal negative musculoskeletal ROS (+)   Abdominal Normal abdominal exam  (+)   Peds  Hematology negative hematology ROS (+)   Anesthesia Other Findings   Reproductive/Obstetrics                             Anesthesia Physical Anesthesia Plan  ASA: III  Anesthesia Plan: Spinal   Post-op Pain Management:    Induction: Intravenous  Airway Management Planned: Natural Airway and Nasal Cannula  Additional Equipment:   Intra-op Plan:   Post-operative Plan:   Informed Consent: I have reviewed the patients History and Physical, chart, labs and discussed the procedure including the risks, benefits and alternatives for the proposed anesthesia with the patient or authorized representative who has indicated his/her understanding and acceptance.     Plan Discussed with: CRNA  Anesthesia Plan Comments:         Anesthesia Quick Evaluation

## 2015-12-28 NOTE — Anesthesia Procedure Notes (Signed)
Spinal  Start time: 12/28/2015 2:16 PM End time: 12/28/2015 2:22 PM Reason for block: at surgeon's request Staffing Anesthesiologist: Marline Backbone F Performed: anesthesiologist  Preanesthetic Checklist Completed: patient identified, site marked, surgical consent, pre-op evaluation, timeout performed, IV checked, risks and benefits discussed and monitors and equipment checked Spinal Block Patient position: sitting Prep: Betasept Patient monitoring: heart rate and blood pressure Approach: midline Location: L3-4 Needle Needle type: Quincke  Needle gauge: 25 G Needle length: 9 cm Needle insertion depth: 6 cm Assessment Sensory level: T8

## 2015-12-28 NOTE — Progress Notes (Signed)
Plan ORIF today. Risks, benefits and possible complications discussed.

## 2015-12-28 NOTE — Progress Notes (Signed)
Pt requiring CIWA coverage during the night. Pt has remaind alert and oriented. Medicated for pain during the night. Polar care applied and tolerating without difficulty. Iv fluids infusing without difficulty.

## 2015-12-28 NOTE — Op Note (Signed)
12/27/2015 - 12/28/2015  3:56 PM  PATIENT:  Chloe [name]  40 y.o. female  PRE-OPERATIVE DIAGNOSIS:  DISTAL TIBIAL FRACTURE  POST-OPERATIVE DIAGNOSIS:  DISTAL TIBIAL FRACTURE  PROCEDURE:  Procedure(s): OPEN REDUCTION INTERNAL FIXATION (ORIF) TIBIA FRACTURE (Right)  SURGEON: Laurene Footman, MD  ASSISTANTS: None  ANESTHESIA:   spinal  EBL:  Total I/O In: 1000 [I.V.:1000] Out: 440 [Urine:430; Blood:10]  BLOOD ADMINISTERED:none  DRAINS: none   LOCAL MEDICATIONS USED:  NONE  SPECIMEN:  No Specimen  DISPOSITION OF SPECIMEN:  N/A  COUNTS:  YES  TOURNIQUET:   52 minutes at 300 mmHg  IMPLANTS: Biomet medial distal tibial locking plate with multiple screws  DICTATION: .Dragon Dictation patient was brought the operating room and after adequate spinal anesthesia was obtained, a bump was placed underneath the right buttock to internally rotate the leg and a tourniquet was applied to the upper thigh. After prepping and draping in sterile fashion appropriate patient identification and timeout procedure were completed. Tourniquet was raised at the start of the case and an anterior medial incision was made over the distal tibia. Subcutaneous tissues tissue spread and the medial aspect of the distal tibia was exposed. Plate was chosen based on length and a subcutaneous sliding of the plate was performed to the appropriate level and a K wire used to hold the plate in position. AP and lateral imaging utilized during the procedure. Next the distal screw holes were filled first with a nonlocking screw to bring the plate down to the bone and then multiple filling the remaining screws with locking screws. Her distal tibias quite small and the pull more posterior screws were multidirectional screws to get adequate fixation into the bone. Next 4 cortical screws were placed through a percutaneous technique proximally with nonlocking cortical screws placed. Alignment was near anatomic. The wounds were  then irrigated and closed with staples proximally, 2-0 Vicryl subcutaneously and the distal incision with skin staples. Xeroform 4 x 4's web roll ABDs and Ace wrap then applied  PLAN OF CARE: Continue as inpatient  PATIENT DISPOSITION:  PACU - hemodynamically stable.

## 2015-12-28 NOTE — Transfer of Care (Signed)
Immediate Anesthesia Transfer of Care Note  Patient: Chloe [name]  Procedure(s) Performed: Procedure(s): OPEN REDUCTION INTERNAL FIXATION (ORIF) TIBIA FRACTURE (Right)  Patient Location: PACU  Anesthesia Type:Spinal  Level of Consciousness: sedated  Airway & Oxygen Therapy: Patient Spontanous Breathing and Patient connected to face mask oxygen  Post-op Assessment: Report given to RN and Post -op Vital signs reviewed and stable  Post vital signs: Reviewed and stable  Last Vitals:  Vitals:   12/28/15 1341 12/28/15 1555  BP: (!) 156/105 (!) 95/45  Pulse: 95 74  Resp: 17 13  Temp: 36.9 C 36.5 C    Last Pain:  Vitals:   12/28/15 1341  TempSrc: Tympanic  PainSc: 8       Patients Stated Pain Goal: 1 ([id number])  Complications: No apparent anesthesia complications

## 2015-12-28 NOTE — Progress Notes (Signed)
Chloe [name] NAME: Chloe [name]    MR#:  [id number]  DATE OF BIRTH:  07/16/1975  SUBJECTIVE:   Patient here after a mechanical fall and noted to have a right tib-fib fracture. Potassium has improved w/ supplementation. Complaining of right leg pain due to fracture.  Going to OR later today.   REVIEW OF SYSTEMS:    Review of Systems  Constitutional: Negative for chills and fever.  HENT: Negative for congestion and tinnitus.   Eyes: Negative for blurred vision and double vision.  Respiratory: Negative for cough, shortness of breath and wheezing.   Cardiovascular: Negative for chest pain, orthopnea and PND.  Gastrointestinal: Negative for abdominal pain, diarrhea, nausea and vomiting.  Genitourinary: Negative for dysuria and hematuria.  Musculoskeletal: Positive for falls and joint pain (right ankle pain).  Neurological: Negative for dizziness, sensory change and focal weakness.  All other systems reviewed and are negative.   Nutrition: NPO for surgery.  Tolerating Diet: NPO Tolerating PT: Await eval post-operatively   DRUG ALLERGIES:   Allergies  Allergen Reactions  . Bee Venom     Shortness of breath  . Sumatriptan Anaphylaxis    VITALS:  Blood pressure (!) 156/105, pulse 95, temperature 98.5 F (36.9 C), temperature source Tympanic, resp. rate 17, height 5\' 2"  (1.575 m), weight 59 kg (130 lb), last menstrual period 12/12/2015, SpO2 100 %.  PHYSICAL EXAMINATION:   Physical Exam  GENERAL:  40 y.o.-year-old patient lying in the bed in no acute distress.  EYES: Pupils equal, round, reactive to light and accommodation. No scleral icterus. Extraocular muscles intact.  HEENT: Head atraumatic, normocephalic. Oropharynx and nasopharynx clear.  NECK:  Supple, no jugular venous distention. No thyroid enlargement, no tenderness.  LUNGS: Normal breath sounds bilaterally, no wheezing, rales, rhonchi. No use of accessory muscles  of respiration.  CARDIOVASCULAR: S1, S2 normal. No murmurs, rubs, or gallops.  ABDOMEN: Soft, nontender, nondistended. Bowel sounds present. No organomegaly or mass.  EXTREMITIES: No cyanosis, clubbing or edema b/l.   Right ankle in cast.   NEUROLOGIC: Cranial nerves II through XII are intact. No focal Motor or sensory deficits b/l.   PSYCHIATRIC: The patient is alert and oriented x 3.  SKIN: No obvious rash, lesion, or ulcer.    LABORATORY PANEL:   CBC  Recent Labs Lab 12/27/15 0413  WBC 5.2  HGB 15.2  HCT 44.2  PLT 140*   ------------------------------------------------------------------------------------------------------------------  Chemistries   Recent Labs Lab 12/28/15 0442  NA 137  K 4.1  CL 102  CO2 29  GLUCOSE 110*  BUN 9  CREATININE 0.59  CALCIUM 8.1*  AST 89*  ALT 49  ALKPHOS 63  BILITOT 0.6   ------------------------------------------------------------------------------------------------------------------  Cardiac Enzymes No results for input(s): TROPONINI in the last 168 hours. ------------------------------------------------------------------------------------------------------------------  RADIOLOGY:  Dg Tibia/fibula Right  Result Date: 12/27/2015 CLINICAL DATA:  40 year old female with fall and right lower extremity trauma. EXAM: RIGHT TIBIA AND FIBULA - 2 VIEW COMPARISON:  None. FINDINGS: There is a displaced spiral fracture of the proximal and mid fibular shaft with anterior displacement of the distal fracture fragment and approximately 6 mm distraction gap. There is a comminuted and displaced oblique fracture of the distal tibia measure approximately 1 cm lateral displacement of the distal fracture fragment. There is no dislocation. The ankle mortise appears intact. There is soft tissue swelling of the right leg. No radiopaque foreign object. IMPRESSION: Fractures of the tibia and fibula as described. Electronically Signed  By: Anner Crete  M.D.   On: 12/27/2015 04:16     ASSESSMENT AND PLAN:   40 year old female with past medical history of alcohol abuse, fatty liver disease, history of seizures, lupus, hypertension, depression/anxiety who presents to the hospital after mechanical fall and noted to have a right ankle fracture.  1. Hypokalemia- improved w/ supplementation and normalized now.   2. Status post fall and right ankle fracture-orthopedics has been consulted.  - going to OR later today.   3. Alcohol abuse-high risk for alcohol withdrawal. - cont. CIWA.  Continue thiamine, folate.  4. Abnormal LFTs-secondary to alcohol abuse. - LFT's improving and will monitor.   5. Hypothyroidism - cont. Synthroid.   6. Hx of Lupus - cont. Plaquenil.    7. Depression - cont. Cymbalta.   All the records are reviewed and case discussed with Care Management/Social Worker. Management plans discussed with the patient, family and they are in agreement.  CODE STATUS: Full code  DVT Prophylaxis: TEd's & SCD's  TOTAL TIME TAKING CARE OF THIS PATIENT: 25 minutes.   POSSIBLE D/C IN 2-3 DAYS, DEPENDING ON CLINICAL CONDITION.   Henreitta Leber M.D on 12/28/2015 at 3:01 PM  Between 7am to 6pm - Pager - [phone]  After 6pm go to [email or web address] - Technical brewer [facility]  Office  [phone]  CC: Primary care physician; No PCP Per Patient

## 2015-12-29 MED ORDER — ASPIRIN EC 325 MG PO TBEC: 325.0000 mg | DELAYED_RELEASE_TABLET | Freq: Every day | ORAL | 0 refills | Status: DC

## 2015-12-29 MED ORDER — OXYCODONE HCL 5 MG PO TABS: 5.0000 mg | ORAL_TABLET | ORAL | 0 refills | Status: DC | PRN

## 2015-12-29 NOTE — Discharge Summary (Signed)
Physician Discharge Summary  Subjective: 1 Day Post-Op Procedure(s) (LRB): OPEN REDUCTION INTERNAL FIXATION (ORIF) TIBIA FRACTURE (Right) Patient reports pain as mild.   Patient seen in rounds with Dr. Rudene Christians. Patient is well, and has had no acute complaints or problems Patient is ready to go Home after physical therapy this morning.  Physician Discharge Summary  Patient ID: Chloe [name] MRN: [id number] DOB/AGE: Jan 03, 1976 40 y.o.  Admit date: 12/27/2015 Discharge date: 12/29/2015  Admission Diagnoses:  Discharge Diagnoses:  Active Problems:   Closed extra-articular fracture of distal tibia, right, initial encounter   Discharged Condition: good  Hospital Course: The patient is postop day 1 from a right ankle distal tibial ORIF. The patient was admitted on 12/27/2015 with a distal tibial fracture. Dr. minced it her surgery yesterday and the patient is doing very well. The patient has been getting up and moving around, and is ready for physical therapy. She has been on medication for high risk of alcohol abuse. She has received Ativan for anxiety.  Treatments: surgery:  OPEN REDUCTION INTERNAL FIXATION (ORIF) TIBIA FRACTURE (Right)  SURGEON: Laurene Footman, MD  ASSISTANTS: None  ANESTHESIA:   spinal  EBL:  Total I/O In: 1000 [I.V.:1000] Out: 440 [Urine:430; Blood:10]  BLOOD ADMINISTERED:none  DRAINS: none   LOCAL MEDICATIONS USED:  NONE  SPECIMEN:  No Specimen  DISPOSITION OF SPECIMEN:  N/A  COUNTS:  YES  TOURNIQUET:   52 minutes at 300 mmHg  IMPLANTS: Biomet medial distal tibial locking plate with multiple screws  Discharge Exam: Blood pressure (!) 148/95, pulse (!) 109, temperature 97.6 F (36.4 C), temperature source Oral, resp. rate 18, height 5\' 2"  (1.575 m), weight 59 kg (130 lb), last menstrual period 12/12/2015, SpO2 99 %.   Disposition: 01-Home or Self Care     Medication List    TAKE these medications   aspirin EC 325 MG  tablet Take 1 tablet (325 mg total) by mouth daily.   DULoxetine 30 MG capsule Commonly known as:  CYMBALTA Take 1 capsule (30 mg total) by mouth daily.   folic acid 1 MG tablet Commonly known as:  FOLVITE Take 1 tablet (1 mg total) by mouth daily.   furosemide 40 MG tablet Commonly known as:  LASIX Take 1 tablet by mouth daily.   hydroxychloroquine 200 MG tablet Commonly known as:  PLAQUENIL Take 1 tablet (200 mg total) by mouth daily.   levETIRAcetam 500 MG tablet Commonly known as:  KEPPRA Take 1 tablet (500 mg total) by mouth 2 (two) times daily.   levothyroxine 25 MCG tablet Commonly known as:  SYNTHROID, LEVOTHROID Take 1 tablet (25 mcg total) by mouth daily before breakfast.   mycophenolate 250 MG capsule Commonly known as:  CELLCEPT Take 250 mg by mouth 2 (two) times daily.   oxyCODONE 5 MG immediate release tablet Commonly known as:  Oxy IR/ROXICODONE Take 1-2 tablets (5-10 mg total) by mouth every 3 (three) hours as needed for breakthrough pain.   thiamine 100 MG tablet Take 1 tablet (100 mg total) by mouth daily.      Follow-up Information    MENZ,MICHAEL, MD Follow up in 1 week(s).   Specialty:  Orthopedic Surgery Why:  Dressing change Contact information: [address] [place] [address] [phone]           Signed: Prescott Parma, Alexa Golebiewski 12/29/2015, 6:45 AM   Objective: Vital signs in last 24 hours: Temp:  [97.4 F (36.3 C)-98.6 F (37 C)] 97.6 F (  36.4 C) (10/28 0419) Pulse Rate:  [74-109] 109 (10/28 0419) Resp:  [13-18] 18 (10/28 0419) BP: (95-156)/(45-105) 148/95 (10/28 0419) SpO2:  [98 %-100 %] 99 % (10/28 0419) Weight:  [59 kg (130 lb)] 59 kg (130 lb) (10/27 1341)  Intake/Output from previous day:  Intake/Output Summary (Last 24 hours) at 12/29/15 0645 Last data filed at 12/29/15 0545  Gross per 24 hour  Intake             1587 ml  Output             2690 ml  Net            -1103 ml     Intake/Output this shift: Total I/O In: -  Out: 2025 [Urine:2025]  Labs:  Recent Labs  12/27/15 0413  HGB 15.2    Recent Labs  12/27/15 0413  WBC 5.2  RBC 4.42  HCT 44.2  PLT 140*    Recent Labs  12/27/15 0413 12/28/15 0442  NA 140 137  K 3.4* 4.1  CL 104 102  CO2 24 29  BUN 10 9  CREATININE 0.56 0.59  GLUCOSE 121* 110*  CALCIUM 8.9 8.1*   No results for input(s): LABPT, INR in the last 72 hours.  EXAM: General - Patient is Alert and Oriented Extremity - Sensation intact distally Compartment soft Incision - clean, dry, no drainage Motor Function -  the patient has good motion of her toes.  Assessment/Plan: 1 Day Post-Op Procedure(s) (LRB): OPEN REDUCTION INTERNAL FIXATION (ORIF) TIBIA FRACTURE (Right) Procedure(s) (LRB): OPEN REDUCTION INTERNAL FIXATION (ORIF) TIBIA FRACTURE (Right) Past Medical History:  Diagnosis Date  . Alcohol use disorder ([place])   . Alcohol withdrawal delirium ([place])   . Anxiety   . 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 Chronic kidney disease    rheumatologist follows her, lasix for this  . Depression   . Hypertension   . Lupus   . Raynaud's phenomenon   . Seizures ([place])   . Steatosis of liver    Active Problems:   Closed extra-articular fracture of distal tibia, right, initial encounter  Estimated body mass index is 23.78 kg/m as calculated from the following:   Height as of this encounter: 5\' 2"  (1.575 m).   Weight as of this encounter: 59 kg (130 lb). Advance diet Up with therapy D/C IV fluids  Plan to discharge home after physical therapy.  Diet - Regular diet Follow up - in 1 week Activity - toe-touch weight-bear on the right Disposition - Home Condition Upon Discharge - Stable DVT Prophylaxis - Aspirin  Reche Dixon, PA-C Orthopaedic Surgery 12/29/2015, 6:45 AM

## 2015-12-29 NOTE — Progress Notes (Signed)
[facility] NAME: Chloe [name]    MR#:  [id number]  DATE OF BIRTH:  Jul 06, 1975  SUBJECTIVE:   Patient here after a mechanical fall and noted to have a right tib-fib fracture.  The patient underwent open reduction internal fixation of tibial fracture under spinal anesthesia on 12/28/2015 by Dr. Rudene Christians. She denies any pain. She was noted to have alcohol withdrawal symptoms, including tachycardia, tremor, agitation and anxiety, scoring above 10 intermittently, requiring IV Ativan injections. Patient wanted to leave the hospital [place], involuntary commitment was obtained for 4 days until she is through with alcohol withdrawal.   REVIEW OF SYSTEMS:    Review of Systems  Unable to perform ROS: Psychiatric disorder  Musculoskeletal: Joint pain: right ankle pain.  Pain is minimal  Nutrition: NPO for surgery.  Tolerating Diet: NPO Tolerating PT: Await eval post-operatively   DRUG ALLERGIES:   Allergies  Allergen Reactions  . Bee Venom     Shortness of breath  . Sumatriptan Anaphylaxis    VITALS:  Blood pressure 140/87, pulse 98, temperature 100.1 F (37.8 C), temperature source Oral, resp. rate 20, height 5\' 2"  (1.575 m), weight 59 kg (130 lb), last menstrual period 12/12/2015, SpO2 100 %.  PHYSICAL EXAMINATION:   Physical Exam  GENERAL:  40 y.o.-year-old patient lying in the bed in Moderate distress, restless, agitated, anxious, very tremulous during my evaluation, aggressive,  pushing me away.  EYES: Pupils equal, round, reactive to light and accommodation. No scleral icterus. Extraocular muscles intact.  HEENT: Head atraumatic, normocephalic. Oropharynx and nasopharynx clear.  NECK:  Supple, no jugular venous distention. No thyroid enlargement, no tenderness.  LUNGS: Normal breath sounds bilaterally, no wheezing, rales, rhonchi. No use of accessory muscles of respiration.  CARDIOVASCULAR: S1, S2 , tachycardic.  No murmurs, rubs, or gallops.  ABDOMEN: Soft, nontender, nondistended. Bowel sounds present. No organomegaly or mass.  EXTREMITIES: No cyanosis, clubbing or edema b/l.   Right ankle in cast.   NEUROLOGIC: Cranial nerves II through XII are intact. No focal Motor or sensory deficits b/l.   PSYCHIATRIC: The patient is alert , unable to assess orientation due to agitation, tremulousness, restlessness, anxiety, unwilling to talk to me, wants to go home, tries to get out from the bed  SKIN: No obvious rash, lesion, or ulcer.    LABORATORY PANEL:   CBC  Recent Labs Lab 12/27/15 0413  WBC 5.2  HGB 15.2  HCT 44.2  PLT 140*   ------------------------------------------------------------------------------------------------------------------  Chemistries   Recent Labs Lab 12/28/15 0442  NA 137  K 4.1  CL 102  CO2 29  GLUCOSE 110*  BUN 9  CREATININE 0.59  CALCIUM 8.1*  AST 89*  ALT 49  ALKPHOS 63  BILITOT 0.6   ------------------------------------------------------------------------------------------------------------------  Cardiac Enzymes No results for input(s): TROPONINI in the last 168 hours. ------------------------------------------------------------------------------------------------------------------  RADIOLOGY:  Dg Tibia/fibula Right  Result Date: 12/28/2015 CLINICAL DATA:  ORIF of right tibial fracture EXAM: DG C-ARM 61-120 MIN; RIGHT TIBIA AND FIBULA - 2 VIEW COMPARISON:  12/27/2015 radiographs of the right tibia and fibula FINDINGS: C-arm fluoroscopic views after plate and screw fixation of comminuted distal diaphyseal tibial fracture are provided. Lateral plate and screw fixation is noted of the comminuted fracture. Fine bony detail is slightly limited by this technique. The ankle mortise is maintained. Distal tibial fracture fragment is 1/4 shaft with displaced laterally though improved in alignment relative to preoperative study. Oblique proximal fibular fracture  is  partially visualized on one view only and is not significantly changed in alignment. There is slight lateral angulation and displacement of the proximal fibular fracture fragment. IMPRESSION: ORIF of comminuted distal tibial fracture with improved alignment since preop radiographs. Oblique proximal fibular fracture is partially included on one view only. This appears unchanged. Electronically Signed   By: Ashley Royalty M.D.   On: 12/28/2015 15:50   Dg Ankle 2 Views Right  Result Date: 12/28/2015 CLINICAL DATA:  40 y/o F; post open reduction and internal fixation of ankle fracture. EXAM: RIGHT ANKLE - 2 VIEW COMPARISON:  None. FINDINGS: Comminuted distal tibial shaft fracture and partially visualized oblique fracture of the proximal fibular diaphysis. The tibial fracture is fixed by a medial plate and multiple transverse screws. Alignment and displacement of fracture fragments is improved with mild persistent medial displacement of the tibial shaft relative to distal components and dorsal apex angulation. There are medial skin staples and soft tissue swelling. No other fracture is identified. IMPRESSION: Status post open reduction and internal fixation of a comminuted distal tibial fracture. Electronically Signed   By: Kristine Garbe M.D.   On: 12/28/2015 16:27   Dg C-arm 61-120 Min  Result Date: 12/28/2015 CLINICAL DATA:  ORIF of right tibial fracture EXAM: DG C-ARM 61-120 MIN; RIGHT TIBIA AND FIBULA - 2 VIEW COMPARISON:  12/27/2015 radiographs of the right tibia and fibula FINDINGS: C-arm fluoroscopic views after plate and screw fixation of comminuted distal diaphyseal tibial fracture are provided. Lateral plate and screw fixation is noted of the comminuted fracture. Fine bony detail is slightly limited by this technique. The ankle mortise is maintained. Distal tibial fracture fragment is 1/4 shaft with displaced laterally though improved in alignment relative to preoperative study. Oblique  proximal fibular fracture is partially visualized on one view only and is not significantly changed in alignment. There is slight lateral angulation and displacement of the proximal fibular fracture fragment. IMPRESSION: ORIF of comminuted distal tibial fracture with improved alignment since preop radiographs. Oblique proximal fibular fracture is partially included on one view only. This appears unchanged. Electronically Signed   By: Ashley Royalty M.D.   On: 12/28/2015 15:50     ASSESSMENT AND PLAN:   40 year old female with past medical history of alcohol abuse, fatty liver disease, history of seizures, lupus, hypertension, depression/anxiety who presents to the hospital after mechanical fall and noted to have a right ankle fracture.  1. Hypokalemia- Resolved w/ supplementation and normalized now.   2. Status post fall and right ankle fracture-orthopedics has been consulted.  - Status post right tibial ORIF 27th of October 2017 by Dr. Rudene Christians, pain control is adequate, continue physical therapy . Discharge could be safely accomplished only after patient is through with the alcohol withdrawal.   3. Alcohol withdrawal. - cont. CIWA.  Continue thiamine, folate. Patient has been issued involuntary commitment for the next 4 days until she is going through withdrawal.   4. Abnormal LFTs-secondary to alcohol abuse. - LFT's improving and will monitor.   5. Hypothyroidism - cont. Synthroid.   6. Hx of Lupus - cont. Plaquenil.    7. Depression - cont. Cymbalta.   All the records are reviewed and case discussed with Care Management/Social Worker. Management plans discussed with the patient, family and they are in agreement.  CODE STATUS: Full code  DVT Prophylaxis: TEd's & SCD's  TOTAL critical care TIME TAKING CARE OF THIS PATIENT: 45 minutes.   POSSIBLE D/C IN 2-3 DAYS, DEPENDING ON CLINICAL CONDITION.  Theodoro Grist M.D on 12/29/2015 at 1:23 PM  Between 7am to 6pm - Pager -  [phone]  After 6pm go to [email or web address] - Technical brewer [place] [facility]  Office  [phone]  CC: Primary care physician; No PCP Per Patient

## 2015-12-29 NOTE — Anesthesia Postprocedure Evaluation (Signed)
Anesthesia Post Note  Patient: Chloe [name]  Procedure(s) Performed: Procedure(s) (LRB): OPEN REDUCTION INTERNAL FIXATION (ORIF) TIBIA FRACTURE (Right)  Patient location during evaluation: Nursing Unit Anesthesia Type: Spinal Level of consciousness: awake, awake and alert and oriented Pain management: pain level controlled Vital Signs Assessment: post-procedure vital signs reviewed and stable Respiratory status: spontaneous breathing, nonlabored ventilation and respiratory function stable Cardiovascular status: blood pressure returned to baseline Postop Assessment: no headache Anesthetic complications: no    Last Vitals:  Vitals:   12/29/15 0735 12/29/15 1140  BP: (!) 153/105 140/87  Pulse: 98 98  Resp: 18 20  Temp: 37.6 C 37.8 C    Last Pain:  Vitals:   12/29/15 1140  TempSrc: Oral  PainSc:                  Otis Brace

## 2015-12-29 NOTE — Progress Notes (Signed)
Physical Therapy Treatment Patient Details Name: Chloe [name] MRN: [id number] DOB: 10-19-1975 Today's Date: 12/29/2015    History of Present Illness Chloe [name] is a 40 y.o. female who complains of  right ankle pain. She returned to her apartment in the early hours of this morning and tripped in her bedroom, twisting the right leg and striking the right lower leg against a bench.  Pt is s/p ORIF Closed extra-articular fracture of distal tibia, right, on 12/28/2015.    PT Comments    Pt in bed ready for session.  Anxious with poor safety and needed mod verbal cues for safety and general mobility.  Stood with min a x 1 and was able to ambulate 15' with min assist with very unsteady gait and high fall risk.  She maintains TTWB well when instructed to maintain NWB.  Pt is unsafe at this time to attempt stair in standing position.  Reviewed seated up/down and transitions from floor with pt and her father.  They agreed this was a safer way to get in/out of the house a this time.  Discussed potential need for wheelchair upon discharge as she has a significant distance from car to door.  They stated family member has a wheelchair and they would see if it was available to borrow.  She is unsafe for any mobility at this time without assist.  She seems unaware of her mobility limitations asking if she could have her own crutches brought in and if she can access the therapy gym to practice with them on the stairs on her own.  Reviewed need to have staff assistance at all times.   Follow Up Recommendations  Home health PT     Equipment Recommendations  Rolling walker with 5" wheels    Recommendations for Other Services       Precautions / Restrictions Precautions Precautions: Fall;Other (comment) Restrictions Weight Bearing Restrictions: Yes RLE Weight Bearing: Touchdown weight bearing Other Position/Activity Restrictions: elevate leg    Mobility  Bed Mobility Overal bed mobility: Modified  Independent             General bed mobility comments: increased time with mobility  Transfers Overall transfer level: Needs assistance Equipment used: Rolling walker (2 wheeled) Transfers: Sit to/from Stand Sit to Stand: Min assist         General transfer comment: unsteady with poor hand placements and safety  Ambulation/Gait Ambulation/Gait assistance: Min assist Ambulation Distance (Feet): 15 Feet Assistive device: Rolling walker (2 wheeled) Gait Pattern/deviations: Step-to pattern Gait velocity: decreased Gait velocity interpretation: Below normal speed for age/gender General Gait Details: Poorly organized gait with significant decreased safety awareness; constant cueing to keep RW within safe distance from front of body and to center walker as pt walking out side of base of walker; verbal and tactile cues for sequenicing and RW management focusing on small step-go steps and constant cue for TTWB'ing with each step.   Stairs Stairs: Yes Stairs assistance: +2 physical assistance Stair Management: Seated/boosting (Verbal review of seated up/down steps.  S)      Wheelchair Mobility    Modified Rankin (Stroke Patients Only)       Balance Overall balance assessment: Needs assistance Sitting-balance support: Feet supported Sitting balance-Leahy Scale: Good     Standing balance support: Bilateral upper extremity supported Standing balance-Leahy Scale: Poor                      Cognition Arousal/Alertness: Awake/alert Behavior During Therapy: Restless;Anxious;Impulsive Overall  Cognitive Status: Impaired/Different from baseline Area of Impairment: Following commands;Safety/judgement       Following Commands: Follows multi-step commands inconsistently Safety/Judgement: Decreased awareness of safety     General Comments: Anxous and jittery, poor awareness  and decreased safety judgement; not following WB restrictions    Exercises      General  Comments General comments (skin integrity, edema, etc.): surgical dressing intact      Pertinent Vitals/Pain Pain Assessment: 0-10 Pain Score: 7  Pain Location: R distal tibia/ankle Pain Descriptors / Indicators: Aching Pain Intervention(s): Limited activity within patient's tolerance    Home Living Family/patient expects to be discharged to:: Private residence Living Arrangements: Alone   Type of Home: Apartment Home Access: Level entry   Home Layout: One level        Prior Function Level of Independence: Independent      Comments: Not currently working   PT Goals (current goals can now be found in the care plan section) Acute Rehab PT Goals Patient Stated Goal: To go home. PT Goal Formulation: With patient Time For Goal Achievement: 01/05/16 Potential to Achieve Goals: Fair    Frequency    BID      PT Plan Current plan remains appropriate    Co-evaluation             End of Session Equipment Utilized During Treatment: Gait belt Activity Tolerance: Patient limited by pain;Treatment limited secondary to agitation Patient left: in bed;with call bell/phone within reach;with family/visitor present     Time: [id number] PT Time Calculation (min) (ACUTE ONLY): 29 min  Charges:  $Gait Training: 23-37 mins                    G Codes:      Chesley Noon, PTA 12/29/15, 12:16 PM

## 2015-12-29 NOTE — Progress Notes (Addendum)
  Subjective: 1 Day Post-Op Procedure(s) (LRB): OPEN REDUCTION INTERNAL FIXATION (ORIF) TIBIA FRACTURE (Right) Patient reports pain as mild.   Patient seen in rounds with Dr. Rudene Christians. Patient is well, and has had no acute complaints or problems Plan is to go Home after hospital stay. Negative for chest pain and shortness of breath Fever: no Gastrointestinal: Negative for nausea and vomiting  Objective: Vital signs in last 24 hours: Temp:  [97.4 F (36.3 C)-98.6 F (37 C)] 97.6 F (36.4 C) (10/28 0419) Pulse Rate:  [74-109] 109 (10/28 0419) Resp:  [13-18] 18 (10/28 0419) BP: (95-156)/(45-105) 148/95 (10/28 0419) SpO2:  [98 %-100 %] 99 % (10/28 0419) Weight:  [59 kg (130 lb)] 59 kg (130 lb) (10/27 1341)  Intake/Output from previous day:  Intake/Output Summary (Last 24 hours) at 12/29/15 0636 Last data filed at 12/29/15 0545  Gross per 24 hour  Intake             1587 ml  Output             2690 ml  Net            -1103 ml    Intake/Output this shift: Total I/O In: -  Out: 2025 [Urine:2025]  Labs:  Recent Labs  12/27/15 0413  HGB 15.2    Recent Labs  12/27/15 0413  WBC 5.2  RBC 4.42  HCT 44.2  PLT 140*    Recent Labs  12/27/15 0413 12/28/15 0442  NA 140 137  K 3.4* 4.1  CL 104 102  CO2 24 29  BUN 10 9  CREATININE 0.56 0.59  GLUCOSE 121* 110*  CALCIUM 8.9 8.1*   No results for input(s): LABPT, INR in the last 72 hours.   EXAM General - Patient is Alert and Oriented Extremity - Sensation intact distally Dorsiflexion/Plantar flexion intact No cellulitis present Compartment soft Dressing/Incision - clean, dry, no drainage Motor Function - intact, moving toes well on exam.   Past Medical History:  Diagnosis Date  . Alcohol use disorder ([place])   . Alcohol withdrawal delirium ([place])   . Anxiety   . Cancer ([place]) 2015   cytosin for lupus  . Chronic kidney disease    rheumatologist follows her, lasix for this  . Depression   . Hypertension   .  Lupus   . Raynaud's phenomenon   . Seizures ([place])   . Steatosis of liver     Assessment/Plan: 1 Day Post-Op Procedure(s) (LRB): OPEN REDUCTION INTERNAL FIXATION (ORIF) TIBIA FRACTURE (Right) Active Problems:   Closed extra-articular fracture of distal tibia, right, initial encounter  Estimated body mass index is 23.78 kg/m as calculated from the following:   Height as of this encounter: 5\' 2"  (1.575 m).   Weight as of this encounter: 59 kg (130 lb). Up with therapy DC IV Plan on discharge home today. We'll need to complete physical therapy first.  DVT Prophylaxis - Lovenox and TED hose Toe-touch Weight-Bearing right leg  Reche Dixon, PA-C Orthopaedic Surgery 12/29/2015, 6:36 AM   Date very poorly with physical therapy unsteady and needing to people to assist with ambulation and unable do stairs. Will continue with physical therapy and cancel discharge of present

## 2015-12-29 NOTE — Evaluation (Signed)
Physical Therapy Evaluation Patient Details Name: Chloe [name] MRN: [id number] DOB: [birth year] Today's Date: 12/29/2015   History of Present Illness  Chloe [name] is a 40 y.o. female who complains of  right ankle pain. She returned to her apartment in the early hours of this morning and tripped in her bedroom, twisting the right leg and striking the right lower leg against a bench.  Pt is s/p ORIF Closed extra-articular fracture of distal tibia, right, on 12/28/2015.  Clinical Impression  Pt presents to PT with significant decrease in functional mobility and gait s/p ORIF.  Pt with visible tremors/shaking and anxiety throughout session with decreased awareness of safety and decreased ability to comply with TTWB'ing status during transfers and gait.  Pt tried to amb with crutches but was unsuccessful and was provided with RW, which pt was better able to maneuver and ambulated 20' with Min A for safety and balance.  Constant verbal and tactile cueing required.  Pt will need 24 hour supervision after discharge with her current mobility status.    Follow Up Recommendations Home health PT    Equipment Recommendations  Rolling walker with 5" wheels    Recommendations for Other Services       Precautions / Restrictions Precautions Precautions: Fall;Other (comment) (CIWA) Restrictions Weight Bearing Restrictions: Yes RLE Weight Bearing: Touchdown weight bearing (Toe-touch Weight-Bearing right leg) Other Position/Activity Restrictions: elevate leg      Mobility  Bed Mobility Overal bed mobility: Modified Independent             General bed mobility comments: increased time to move R LE across bed, using UE's to assist with leg movement  Transfers Overall transfer level: Needs assistance Equipment used: Rolling walker (2 wheeled) Transfers: Sit to/from Stand Sit to Stand: Min assist         General transfer comment: Sit<>stand with Min A for initial standing balance with cues  for hand placement on RW and to maintain TTWB'ing status  Ambulation/Gait Ambulation/Gait assistance: Min assist Ambulation Distance (Feet): 20 Feet Assistive device: Rolling walker (2 wheeled) Gait Pattern/deviations: Step-to pattern     General Gait Details: Poorly organized gait with significant decreased safety awareness; constant cueing to keep RW within safe distance from front of body and to center walker as pt walking out side of base of walker; verbal and tactile cues for sequenicing and RW management focusing on small step-go steps and constant cue for TTWB'ing with each step.  Stairs            Wheelchair Mobility    Modified Rankin (Stroke Patients Only)       Balance Overall balance assessment: Needs assistance   Sitting balance-Leahy Scale: Good     Standing balance support: Bilateral upper extremity supported;During functional activity Standing balance-Leahy Scale: Poor                               Pertinent Vitals/Pain Pain Assessment: 0-10 Pain Location: R distal tibia Pain Descriptors / Indicators: Aching Pain Intervention(s): Limited activity within patient's tolerance;Monitored during session;Premedicated before session    Home Living Family/patient expects to be discharged to:: Private residence Living Arrangements: Alone   Type of Home: Apartment Home Access: Level entry     Home Layout: One level        Prior Function Level of Independence: Independent         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, newspaper  Extremity/Trunk Assessment   Upper Extremity Assessment: Overall WFL for tasks assessed           Lower Extremity Assessment: Overall WFL for tasks assessed;RLE deficits/detail RLE Deficits / Details: c/o numbness in toes; ankle wrapped in surgical dressing; knee and hip WFL    Cervical / Trunk Assessment: Normal  Communication   Communication: No difficulties  Cognition  Arousal/Alertness: Awake/alert Behavior During Therapy: Restless;Anxious;Impulsive Overall Cognitive Status: Impaired/Different from baseline Area of Impairment: Following commands;Safety/judgement       Following Commands: Follows multi-step commands inconsistently Safety/Judgement: Decreased awareness of safety     General Comments: Anxous and jittery, poor awareness  and decreased safety judgement; not following WB restrictions    General Comments General comments (skin integrity, edema, etc.): surgical dressing intact    Exercises     Assessment/Plan    PT Assessment Patient needs continued PT services  PT Problem List Decreased activity tolerance;Decreased balance;Decreased mobility;Decreased coordination;Decreased knowledge of use of DME;Decreased safety awareness;Decreased knowledge of precautions          PT Treatment Interventions DME instruction;Gait training;Stair training;Functional mobility training;Therapeutic activities;Therapeutic exercise;Balance training;Patient/family education    PT Goals (Current goals can be found in the Care Plan section)  Acute Rehab PT Goals Patient Stated Goal: To go home. PT Goal Formulation: With patient Time For Goal Achievement: 01/05/16 Potential to Achieve Goals: Fair    Frequency BID   Barriers to discharge Decreased caregiver support lives alone    Co-evaluation               End of Session Equipment Utilized During Treatment: Gait belt Activity Tolerance: Patient limited by pain;Treatment limited secondary to agitation Patient left: in bed;with call bell/phone within reach;with family/visitor present Nurse Communication: Mobility status;Weight bearing status         Time: [id number] PT Time Calculation (min) (ACUTE ONLY): 33 min   Charges:   PT Evaluation $PT Eval Moderate Complexity: 1 Procedure PT Treatments $Gait Training: 8-22 mins   PT G Codes:        Aubre Quincy A Deeya Richeson, PT 12/29/2015, 9:59  AM

## 2015-12-29 NOTE — Progress Notes (Signed)
Dr. Ether Griffins notified of needing IVC/sitter order. MD to place order.

## 2015-12-29 NOTE — BH Assessment (Signed)
Writer informed psychiatrist (Dr. Alonna Minium) of consult.

## 2015-12-29 NOTE — Discharge Instructions (Signed)
INSTRUCTIONS AFTER Surgery  o Remove items at home which could result in a fall. This includes throw rugs or furniture in walking pathways o ICE to the affected joint every three hours while awake for 30 minutes at a time, for at least the first 3-5 days, and then as needed for pain and swelling.  Continue to use ice for pain and swelling. You may notice swelling that will progress down to the foot and ankle.  This is normal after surgery.  Elevate your leg when you are not up walking on it.   o Continue to use the breathing machine you got in the hospital (incentive spirometer) which will help keep your temperature down.  It is common for your temperature to cycle up and down following surgery, especially at night when you are not up moving around and exerting yourself.  The breathing machine keeps your lungs expanded and your temperature down.   DIET:  As you were doing prior to hospitalization, we recommend a well-balanced diet.  DRESSING / WOUND CARE / SHOWERING  Keep your dressing in place and try not to remove your Ace wraps. No showering.  ACTIVITY  o Increase activity slowly as tolerated, but follow the weight bearing instructions below.   o No driving for 6 weeks or until further direction given by your physician.  You cannot drive while taking narcotics.  o No lifting or carrying greater than 10 lbs. until further directed by your surgeon. o Avoid periods of inactivity such as sitting longer than an hour when not asleep. This helps prevent blood clots.  o You may return to work once you are authorized by your doctor.     WEIGHT BEARING  Toe-touch weight-bearing on the right   EXERCISES   CONSTIPATION  Constipation is defined medically as fewer than three stools per week and severe constipation as less than one stool per week.  Even if you have a regular bowel pattern at home, your normal regimen is likely to be disrupted due to multiple reasons following surgery.   Combination of anesthesia, postoperative narcotics, change in appetite and fluid intake all can affect your bowels.   YOU MUST use at least one of the following options; they are listed in order of increasing strength to get the job done.  They are all available over the counter, and you may need to use some, POSSIBLY even all of these options:    Drink plenty of fluids (prune juice may be helpful) and high fiber foods Colace 100 mg by mouth twice a day  Senokot for constipation as directed and as needed Dulcolax (bisacodyl), take with full glass of water  Miralax (polyethylene glycol) once or twice a day as needed.  If you have tried all these things and are unable to have a bowel movement in the first 3-4 days after surgery call either your surgeon or your primary doctor.    If you experience loose stools or diarrhea, hold the medications until you stool forms back up.  If your symptoms do not get better within 1 week or if they get worse, check with your doctor.  If you experience "the worst abdominal pain ever" or develop nausea or vomiting, please contact the office immediately for further recommendations for treatment.   ITCHING:  If you experience itching with your medications, try taking only a single pain pill, or even half a pain pill at a time.  You can also use Benadryl over the counter for itching or  also to help with sleep.   TED HOSE STOCKINGS:  Use stockings on both legs until for at least 2 weeks or as directed by physician office. They may be removed at night for sleeping.  MEDICATIONS:  See your medication summary on the After Visit Summary that nursing will review with you.  You may have some home medications which will be placed on hold until you complete the course of blood thinner medication.  It is important for you to complete the blood thinner medication as prescribed.  PRECAUTIONS:  If you experience chest pain or shortness of breath - call 911 immediately for  transfer to the hospital emergency department.   If you develop a fever greater that 101 F, purulent drainage from wound, increased redness or drainage from wound, foul odor from the wound/dressing, or calf pain - CONTACT YOUR SURGEON.                                                   FOLLOW-UP APPOINTMENTS:  If you do not already have a post-op appointment, please call the office for an appointment to be seen by your surgeon.  Guidelines for how soon to be seen are listed in your After Visit Summary, but are typically between 1-4 weeks after surgery.  OTHER INSTRUCTIONS:     MAKE SURE YOU:   Understand these instructions.   Get help right away if you are not doing well or get worse.    Thank you for letting us be a part of your medical care team.  It is a privilege we respect greatly.  We hope these instructions will help you stay on track for a fast and full recovery!

## 2015-12-29 NOTE — Progress Notes (Signed)
Pts VSS, pain being managed with PRN medications. Pt tolerated physical therapy and working to improve ambulation. Several attempts OOB without assistance and almost fell several times before being able to be assisted. Sitter currently at bedside at this time and will remain with pt while IVC in place. Pt remains on RA with o2 sats WNL. Pt tolerating regular diet, no BM reported, continued on bowel regimen. Pt continues to score high on CIWA and requiring scheduled and PRN Lorazepam. Pt with moderate tremors and severe hallucinations at this time. Mds aware. Family at bedside at this time. Will continue to monitor.

## 2015-12-30 DIAGNOSIS — F10231 Alcohol dependence with withdrawal delirium: Secondary | ICD-10-CM

## 2015-12-30 LAB — CBC
HCT: 27 % — ABNORMAL LOW (ref 35.0–47.0)
Hemoglobin: 9.6 g/dL — ABNORMAL LOW (ref 12.0–16.0)
MCH: 35.1 pg — AB (ref 26.0–34.0)
MCHC: 35.5 g/dL (ref 32.0–36.0)
MCV: 98.9 fL (ref 80.0–100.0)
PLATELETS: 111 10*3/uL — AB (ref 150–440)
RBC: 2.73 MIL/uL — ABNORMAL LOW (ref 3.80–5.20)
RDW: 12.1 % (ref 11.5–14.5)
WBC: 7.5 10*3/uL (ref 3.6–11.0)

## 2015-12-30 LAB — HEPATIC FUNCTION PANEL
ALT: 42 U/L (ref 14–54)
AST: 128 U/L — ABNORMAL HIGH (ref 15–41)
Albumin: 4 g/dL (ref 3.5–5.0)
Alkaline Phosphatase: 61 U/L (ref 38–126)
BILIRUBIN DIRECT: 0.3 mg/dL (ref 0.1–0.5)
BILIRUBIN INDIRECT: 1.1 mg/dL — AB (ref 0.3–0.9)
Total Bilirubin: 1.4 mg/dL — ABNORMAL HIGH (ref 0.3–1.2)
Total Protein: 7.4 g/dL (ref 6.5–8.1)

## 2015-12-30 LAB — MAGNESIUM: Magnesium: 1.1 mg/dL — ABNORMAL LOW (ref 1.7–2.4)

## 2015-12-30 LAB — COMPREHENSIVE METABOLIC PANEL
ALBUMIN: 4 g/dL (ref 3.5–5.0)
ALT: 45 U/L (ref 14–54)
ANION GAP: 8 (ref 5–15)
AST: 135 U/L — ABNORMAL HIGH (ref 15–41)
Alkaline Phosphatase: 68 U/L (ref 38–126)
BILIRUBIN TOTAL: 1.4 mg/dL — AB (ref 0.3–1.2)
BUN: 6 mg/dL (ref 6–20)
CO2: 28 mmol/L (ref 22–32)
Calcium: 8.5 mg/dL — ABNORMAL LOW (ref 8.9–10.3)
Chloride: 96 mmol/L — ABNORMAL LOW (ref 101–111)
Creatinine, Ser: 0.55 mg/dL (ref 0.44–1.00)
GFR calc non Af Amer: >60 mL/min (ref >60)
GLUCOSE: 137 mg/dL — AB (ref 65–99)
POTASSIUM: 3.4 mmol/L — AB (ref 3.5–5.1)
SODIUM: 132 mmol/L — AB (ref 135–145)
TOTAL PROTEIN: 7.4 g/dL (ref 6.5–8.1)

## 2015-12-30 MED ORDER — MAGNESIUM SULFATE 4 GM/100ML IV SOLN
4.0000 g | Freq: Two times a day (BID) | INTRAVENOUS | Status: AC
  Administered 2015-12-30 (×2): 4 g via INTRAVENOUS
  Filled 2015-12-30 (×2): qty 100

## 2015-12-30 MED ORDER — CHLORDIAZEPOXIDE HCL 25 MG PO CAPS: 25.0000 mg | ORAL_CAPSULE | Freq: Three times a day (TID) | ORAL | 0 refills | Status: DC | PRN

## 2015-12-30 NOTE — Care Management Note (Addendum)
Case Management Note  Patient Details  Name: Karla Stennes MRN: [id number] Date of Birth: 02/15/1976  Subjective/Objective:       Discussed with Ms Perrone's nurse that, per Dr Ether Griffins, all prescriptions were to be given to Ms Palm's father at time of discharge today. . Father has agreed to be in charge of Ms Calise's medication, and will dispense her medications to her via a daily pill minder per Dr Ether Griffins. Waiting for Orthopedics to write discharge orders per Dr Ether Griffins. A RW and a BSC have been requested from [facility]. A referral for charity HH-PT has been faxed to [facility].              Action/Plan:   Expected Discharge Date:                  Expected Discharge Plan:     In-House Referral:     Discharge planning Services     Post Acute Care Choice:    Choice offered to:     DME Arranged:    DME Agency:     HH Arranged:    HH Agency:     Status of Service:     If discussed at [employer] of Stay Meetings, dates discussed:    Additional Comments:  Jurline Folger A, RN 12/30/2015, 10:16 AM

## 2015-12-30 NOTE — Progress Notes (Signed)
  Subjective: 2 Days Post-Op Procedure(s) (LRB): OPEN REDUCTION INTERNAL FIXATION (ORIF) TIBIA FRACTURE (Right) Patient reports pain as mild.   Patient seen in rounds with Dr. Rudene Christians. Patient is well, and has had no acute complaints or problems.  Admitted advance involuntary commitment because of alcohol withdrawal. Plan is to go Home after hospital stay, once she is medically stable and cleared by medicine. Negative for chest pain and shortness of breath Fever: no Gastrointestinal: Negative for nausea and vomiting  Objective: Vital signs in last 24 hours: Temp:  [98.4 F (36.9 C)-100.1 F (37.8 C)] 100.1 F (37.8 C) (10/29 0513) Pulse Rate:  [90-109] 90 (10/29 0513) Resp:  [18-20] 20 (10/29 0513) BP: (136-153)/(87-105) 144/94 (10/29 0513) SpO2:  [96 %-100 %] 100 % (10/29 0513)  Intake/Output from previous day:  Intake/Output Summary (Last 24 hours) at 12/30/15 0649 Last data filed at 12/29/15 1853  Gross per 24 hour  Intake           7867.5 ml  Output             1675 ml  Net           6192.5 ml    Intake/Output this shift: No intake/output data recorded.  Labs:  Recent Labs  12/30/15 0400  HGB 9.6*    Recent Labs  12/30/15 0400  WBC 7.5  RBC 2.73*  HCT 27.0*  PLT 111*    Recent Labs  12/28/15 0442  NA 137  K 4.1  CL 102  CO2 29  BUN 9  CREATININE 0.59  GLUCOSE 110*  CALCIUM 8.1*   No results for input(s): LABPT, INR in the last 72 hours.   EXAM General - Patient is Alert and Oriented Extremity - Sensation intact distally Dorsiflexion/Plantar flexion intact No cellulitis present Compartment soft Dressing/Incision - clean, dry, no drainage Motor Function - intact, moving toes well on exam. The patient ambulated 20 feet but was very unstable with a walker.  Past Medical History:  Diagnosis Date  . Alcohol use disorder ([place])   . Alcohol withdrawal delirium ([place])   . Anxiety   . Cancer ([place]) 2015   cytosin for lupus  . Chronic kidney  disease    rheumatologist follows her, lasix for this  . Depression   . Hypertension   . Lupus   . Raynaud's phenomenon   . Seizures ([place])   . Steatosis of liver     Assessment/Plan: 2 Days Post-Op Procedure(s) (LRB): OPEN REDUCTION INTERNAL FIXATION (ORIF) TIBIA FRACTURE (Right) Active Problems:   Closed extra-articular fracture of distal tibia, right, initial encounter  Estimated body mass index is 23.78 kg/m as calculated from the following:   Height as of this encounter: 5\' 2"  (1.575 m).   Weight as of this encounter: 59 kg (130 lb). Up with therapy DC IV Plan on discharge home when cleared medically. We'll need to be safe with ambulation and physical therapy first.  DVT Prophylaxis - Lovenox and TED hose Toe-touch Weight-Bearing right leg  Reche Dixon, PA-C Orthopaedic Surgery 12/30/2015, 6:49 AM

## 2015-12-30 NOTE — Progress Notes (Addendum)
[name] NAME: Chloe [name]    MR#:  [id number]  DATE OF BIRTH:  1976/02/04  SUBJECTIVE:   Patient here after a mechanical fall and noted to have a right tib-fib fracture.  The patient underwent open reduction internal fixation of tibial fracture under spinal anesthesia on 12/28/2015 by Dr. Rudene Christians. She denies any pain. She was noted to have alcohol withdrawal symptoms, including tachycardia, tremor, agitation and anxiety, scoring above 10 intermittently, requiring IV Ativan injections. Patient wanted to leave the hospital [facility] yesterday, involuntary commitment was obtained for 4 days until she is through with alcohol withdrawal. They can cut the blankets yesterday. CIWA scale revealed levels of 0-3 today, patient has less agitated or restless,  however, remains tremulous, especially when ambulating. She is in a better mood and able to engage in conversation today. Becomes very happy when is told of possible discharge today. Claims she will go to her father's who would dispense her medications appropriately. Complains of some right lower extremity pain just above the ankle, does not require pain medications  REVIEW OF SYSTEMS:    Review of Systems  Unable to perform ROS: Psychiatric disorder  Musculoskeletal: Joint pain: right ankle pain.  Pain is minimal  Nutrition: NPO for surgery.  Tolerating Diet: NPO Tolerating PT: Await eval post-operatively   DRUG ALLERGIES:   Allergies  Allergen Reactions  . Bee Venom     Shortness of breath  . Sumatriptan Anaphylaxis    VITALS:  Blood pressure (!) 132/95, pulse (!) 118, temperature 99.2 F (37.3 C), temperature source Oral, resp. rate 20, height 5\' 2"  (1.575 m), weight 59 kg (130 lb), last menstrual period 12/12/2015, SpO2 100 %.  PHYSICAL EXAMINATION:   Physical Exam  GENERAL:  40 y.o.-year-old patient lying in the bed in Mild distress, restless, some tremulous  ,  but much more comfortable than yesterday  EYES: Pupils equal, round, reactive to light and accommodation. No scleral icterus. Extraocular muscles intact.  HEENT: Head atraumatic, normocephalic. Oropharynx and nasopharynx clear.  NECK:  Supple, no jugular venous distention. No thyroid enlargement, no tenderness.  LUNGS: Normal breath sounds bilaterally, no wheezing, rales, rhonchi. No use of accessory muscles of respiration.  CARDIOVASCULAR: S1, S2 , tachycardic. No murmurs, rubs, or gallops.  ABDOMEN: Soft, nontender, nondistended. Bowel sounds present. No organomegaly or mass.  EXTREMITIES: No cyanosis, clubbing or edema b/l.   Right ankle in cast.   NEUROLOGIC: Cranial nerves II through XII are intact. No focal Motor or sensory deficits b/l.   PSYCHIATRIC: The patient is alert ,  oriented 3   SKIN: No obvious rash, lesion, or ulcer.    LABORATORY PANEL:   CBC  Recent Labs Lab 12/30/15 0400  WBC 7.5  HGB 9.6*  HCT 27.0*  PLT 111*   ------------------------------------------------------------------------------------------------------------------  Chemistries   Recent Labs Lab 12/28/15 0442 12/30/15 0400  NA 137  --   K 4.1  --   CL 102  --   CO2 29  --   GLUCOSE 110*  --   BUN 9  --   CREATININE 0.59  --   CALCIUM 8.1*  --   MG  --  1.1*  AST 89* 128*  ALT 49 42  ALKPHOS 63 61  BILITOT 0.6 1.4*   ------------------------------------------------------------------------------------------------------------------  Cardiac Enzymes No results for input(s): TROPONINI in the last 168 hours. ------------------------------------------------------------------------------------------------------------------  RADIOLOGY:  Dg Tibia/fibula Right  Result Date: 12/28/2015 CLINICAL DATA:  ORIF  of right tibial fracture EXAM: DG C-ARM 61-120 MIN; RIGHT TIBIA AND FIBULA - 2 VIEW COMPARISON:  12/27/2015 radiographs of the right tibia and fibula FINDINGS: C-arm fluoroscopic views  after plate and screw fixation of comminuted distal diaphyseal tibial fracture are provided. Lateral plate and screw fixation is noted of the comminuted fracture. Fine bony detail is slightly limited by this technique. The ankle mortise is maintained. Distal tibial fracture fragment is 1/4 shaft with displaced laterally though improved in alignment relative to preoperative study. Oblique proximal fibular fracture is partially visualized on one view only and is not significantly changed in alignment. There is slight lateral angulation and displacement of the proximal fibular fracture fragment. IMPRESSION: ORIF of comminuted distal tibial fracture with improved alignment since preop radiographs. Oblique proximal fibular fracture is partially included on one view only. This appears unchanged. Electronically Signed   By: Ashley Royalty M.D.   On: 12/28/2015 15:50   Dg Ankle 2 Views Right  Result Date: 12/28/2015 CLINICAL DATA:  40 y/o F; post open reduction and internal fixation of ankle fracture. EXAM: RIGHT ANKLE - 2 VIEW COMPARISON:  None. FINDINGS: Comminuted distal tibial shaft fracture and partially visualized oblique fracture of the proximal fibular diaphysis. The tibial fracture is fixed by a medial plate and multiple transverse screws. Alignment and displacement of fracture fragments is improved with mild persistent medial displacement of the tibial shaft relative to distal components and dorsal apex angulation. There are medial skin staples and soft tissue swelling. No other fracture is identified. IMPRESSION: Status post open reduction and internal fixation of a comminuted distal tibial fracture. Electronically Signed   By: Kristine Garbe M.D.   On: 12/28/2015 16:27   Dg C-arm 61-120 Min  Result Date: 12/28/2015 CLINICAL DATA:  ORIF of right tibial fracture EXAM: DG C-ARM 61-120 MIN; RIGHT TIBIA AND FIBULA - 2 VIEW COMPARISON:  12/27/2015 radiographs of the right tibia and fibula FINDINGS:  C-arm fluoroscopic views after plate and screw fixation of comminuted distal diaphyseal tibial fracture are provided. Lateral plate and screw fixation is noted of the comminuted fracture. Fine bony detail is slightly limited by this technique. The ankle mortise is maintained. Distal tibial fracture fragment is 1/4 shaft with displaced laterally though improved in alignment relative to preoperative study. Oblique proximal fibular fracture is partially visualized on one view only and is not significantly changed in alignment. There is slight lateral angulation and displacement of the proximal fibular fracture fragment. IMPRESSION: ORIF of comminuted distal tibial fracture with improved alignment since preop radiographs. Oblique proximal fibular fracture is partially included on one view only. This appears unchanged. Electronically Signed   By: Ashley Royalty M.D.   On: 12/28/2015 15:50     ASSESSMENT AND PLAN:   40 year old female with past medical history of alcohol abuse, fatty liver disease, history of seizures, lupus, hypertension, depression/anxiety who presents to the hospital after mechanical fall and noted to have a right ankle fracture.  1. Hypokalemia- Resolved w/ supplementation and normalized now.   2. Status post fall and right ankle fracture-orthopedics has been consulted.  - Status post right tibial ORIF 27th of October 2017 by Dr. Rudene Christians, pain control is adequate, continue physical therapy . Discharge is possible when patient is through alcohol withdrawal, following CIWA scale closely.   3. Alcohol withdrawal delirium . - cont. CIWA.  Continue thiamine, folate. Patient has been continued on involuntary commitment for the next few days until she is going through withdrawal. . Psychiatrist is to see  patient in consultation . Prescription for Librium 25 mg 22 tablets is written for tapering at home if patient is felt to be stable to be discharged.   4. Abnormal LFTs-secondary to alcohol abuse,  LFTs are fluctuating and will monitor.   5. Hypothyroidism - cont. Synthroid.   6. Hx of Lupus - cont. Plaquenil.    7. Depression - cont. Cymbalta.   8. Hypomagnesemia, supplementing intravenously    All the records are reviewed and case discussed with Care Management/Social Worker. Management plans discussed with the patient, family and they are in agreement.  CODE STATUS: Full code  DVT Prophylaxis: TEd's & SCD's  TOTAL critical care TIME TAKING CARE OF THIS PATIENT: 30 minutes.   POSSIBLE D/C IN 2-3 DAYS, DEPENDING ON CLINICAL CONDITION.   Theodoro Grist M.D on 12/30/2015 at 11:24 AM  Between 7am to 6pm - Pager - [phone]  After 6pm go to [email or web address] - [employer] [facility]  Office  [phone]  CC: Primary care physician; No PCP Per Patient

## 2015-12-30 NOTE — Progress Notes (Signed)
Physical Therapy Treatment Patient Details Name: Chloe [name] MRN: [id number] DOB: November 16, 1975 Today's Date: 12/30/2015    History of Present Illness Chloe [name] is a 40 y.o. female who complains of  right ankle pain. She returned to her apartment in the early hours of this morning and tripped in her bedroom, twisting the right leg and striking the right lower leg against a bench.  Pt is s/p ORIF Closed extra-articular fracture of distal tibia, right, on 12/28/2015.    PT Comments    Patient continues to remain unsteady in standing and with gait tasks. Instructed patient in gait training with RW and with BUE axillary crutches. Patient requires min A with RW, and mod A with crutches. She prefers to use crutches at home. Patient was able to maintain RLE TTWB well with gait training today which is improvement. She continues to demonstrate poor standing balance occasionally requiring mod A to right self to midline. Patient educated in importance of having assistance when getting out of bed. She does have a nurse/sitter in room.She would benefit from additional skilled PT intervention to work on balance, safety and functional mobility;   Follow Up Recommendations  Home health PT     Equipment Recommendations  Rolling walker with 5" wheels    Recommendations for Other Services       Precautions / Restrictions Precautions Precautions: Fall Restrictions Weight Bearing Restrictions: Yes RLE Weight Bearing: Touchdown weight bearing Other Position/Activity Restrictions: elevate leg    Mobility  Bed Mobility               General bed mobility comments: pt already up in bed; able to scoot edge of bed mod I   Transfers Overall transfer level: Needs assistance Equipment used: Rolling walker (2 wheeled);Crutches Transfers: Sit to/from Stand Sit to Stand: Min assist         General transfer comment: Patient is min A for sit<>stand with RW and with crutches x3 reps; She is unsteady  in standing requiring mod A at times to steady self.   Ambulation/Gait Ambulation/Gait assistance: Min assist;Mod assist Ambulation Distance (Feet): 50 Feet Assistive device: Rolling walker (2 wheeled);Crutches Gait Pattern/deviations: Step-to pattern Gait velocity: decreased   General Gait Details: Patient instructed in gait training with RW x50 feet with min A, requiring mod A with turns; required mod VCs to slow down advancement of RW for steadiness and to take shorter steps for less loss of balance; Patient demonstrates better safety with RW at this time; Instructed patient in gait training with BUE axillary crutches x50 feet with mod A; Patient very unsteady with turns. She was able to maintain RLE TTWB well with crutches which is improvement from last session;    Stairs            Wheelchair Mobility    Modified Rankin (Stroke Patients Only)       Balance           Standing balance support: Bilateral upper extremity supported Standing balance-Leahy Scale: Poor Standing balance comment: demonstrates decreased trunk control and poor hip strategies; Very unsteady in standing requiring mod A at times to correct positioning;                     Cognition Arousal/Alertness: Awake/alert Behavior During Therapy: Restless;Anxious;Impulsive Overall Cognitive Status: Impaired/Different from baseline Area of Impairment: Following commands;Safety/judgement       Following Commands: Follows multi-step commands inconsistently Safety/Judgement: Decreased awareness of safety     General Comments: Anxous  and jittery, poor awareness  and decreased safety judgement; not following WB restrictions    Exercises      General Comments        Pertinent Vitals/Pain Pain Assessment: 0-10 Pain Score: 1  Pain Location: right distal leg Pain Descriptors / Indicators: Aching;Sore Pain Intervention(s): Limited activity within patient's tolerance;Monitored during  session;Repositioned    Home Living                      Prior Function            PT Goals (current goals can now be found in the care plan section) Acute Rehab PT Goals Patient Stated Goal: To go home. PT Goal Formulation: With patient Time For Goal Achievement: 01/05/16 Potential to Achieve Goals: Fair Progress towards PT goals: Progressing toward goals    Frequency    BID      PT Plan Current plan remains appropriate    Co-evaluation             End of Session Equipment Utilized During Treatment: Gait belt Activity Tolerance: Patient tolerated treatment well;Patient limited by fatigue Patient left: with call bell/phone within reach;in chair;with nursing/sitter in room;with chair alarm set     Time: 0800-0825 PT Time Calculation (min) (ACUTE ONLY): 25 min  Charges:  $Gait Training: 8-22 mins $Therapeutic Activity: 8-22 mins                    G Codes:      Trotter,Margaret PT, DPT 12/30/2015, 9:13 AM
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
## 2015-12-30 NOTE — Plan of Care (Signed)
Problem: Pain Managment: Goal: General experience of comfort will improve Outcome: Progressing Patient reports no symptoms of withdrawal. Feeling much better today

## 2015-12-31 ENCOUNTER — Encounter: Payer: Self-pay | Admitting: Orthopedic Surgery

## 2015-12-31 LAB — CBC
HEMATOCRIT: 27.1 % — AB (ref 35.0–47.0)
Hemoglobin: 9.8 g/dL — ABNORMAL LOW (ref 12.0–16.0)
MCH: 35.9 pg — ABNORMAL HIGH (ref 26.0–34.0)
MCHC: 36.1 g/dL — AB (ref 32.0–36.0)
MCV: 99.5 fL (ref 80.0–100.0)
Platelets: 174 10*3/uL (ref 150–440)
RBC: 2.73 MIL/uL — ABNORMAL LOW (ref 3.80–5.20)
RDW: 12.3 % (ref 11.5–14.5)
WBC: 6.5 10*3/uL (ref 3.6–11.0)

## 2015-12-31 LAB — MAGNESIUM: Magnesium: 2.2 mg/dL (ref 1.7–2.4)

## 2015-12-31 MED ORDER — LORAZEPAM 0.5 MG PO TABS: 0.5000 mg | ORAL_TABLET | Freq: Three times a day (TID) | ORAL | 0 refills | Status: DC | PRN

## 2015-12-31 NOTE — Plan of Care (Signed)
Problem: Bowel/Gastric: Goal: Will not experience complications related to bowel motility Outcome: Completed/Met Date Met: 12/31/15 Pt has met all goals for discharge, has remained free of falls and injury, has participated in physical therapy. Pt is agreement with discharge.

## 2015-12-31 NOTE — Progress Notes (Signed)
Shift assessment completed. Pt has sitter at bedside, is in bed but fully dressed, anticipates discharge today. Pt rating her pain to r ankle at tolerable level, pt is on room air, lungs clear bilat. S1S2 heard, abdomen is soft, bs heard. Pt denied difficulty voiding.ppp, pt is able to wiggle her toes to r foot, dressing is dry and intact, ortho in on rounds directly prededing assessment. Call bell in reach.

## 2015-12-31 NOTE — Care Management (Signed)
Case discussed with Rachelle Hora. Pt will not need home health PT or Lovenox. Patient updated.

## 2015-12-31 NOTE — Progress Notes (Signed)
[place] [facility] [place] NAME: Chloe [name]    MR#:  [id number]  DATE OF BIRTH:  Jan 29, 1976  SUBJECTIVE:   Patient here after a mechanical fall and noted to have a right tib-fib fracture.  The patient underwent open reduction internal fixation of tibial fracture under spinal anesthesia on 12/28/2015 by Dr. Rudene Christians. She denies any pain. She was noted to have alcohol withdrawal symptoms, including tachycardia, tremor, agitation and anxiety, scoring above 10 intermittently, requiring IV Ativan injections. Patient wanted to leave the hospital [place] . It is ago, involuntary commitment was obtained until she is through with alcohol withdrawal. CIWA scale revealed levels of 0-3 today, patient was in good mood, no agitation or restlessness, no tremor. She was felt to be stable to be discharged home with her father for home health physical therapy  REVIEW OF SYSTEMS:    Review of Systems  Unable to perform ROS: Psychiatric disorder  Musculoskeletal: Joint pain: right ankle pain.  Pain is minimal  Nutrition: NPO for surgery.  Tolerating Diet: NPO Tolerating PT: Await eval post-operatively   DRUG ALLERGIES:   Allergies  Allergen Reactions  . Bee Venom     Shortness of breath  . Sumatriptan Anaphylaxis    VITALS:  Blood pressure 126/74, pulse 91, temperature 99.1 F (37.3 C), temperature source Oral, resp. rate 20, height 5\' 2"  (1.575 m), weight 59 kg (130 lb), last menstrual period 12/12/2015, SpO2 100 %.  PHYSICAL EXAMINATION:   Physical Exam  GENERAL:  40 y.o.-year-old patient lying in the bed in No distress, not more tremor, comfortable   EYES: Pupils equal, round, reactive to light and accommodation. No scleral icterus. Extraocular muscles intact.  HEENT: Head atraumatic, normocephalic. Oropharynx and nasopharynx clear.  NECK:  Supple, no jugular venous distention. No thyroid enlargement, no tenderness.  LUNGS: Normal breath  sounds bilaterally, no wheezing, rales, rhonchi. No use of accessory muscles of respiration.  CARDIOVASCULAR: S1, S2 , tachycardic. No murmurs, rubs, or gallops.  ABDOMEN: Soft, nontender, nondistended. Bowel sounds present. No organomegaly or mass.  EXTREMITIES: No cyanosis, clubbing or edema b/l.   Right ankle in cast.   NEUROLOGIC: Cranial nerves II through XII are intact. No focal Motor or sensory deficits b/l.   PSYCHIATRIC: The patient is alert ,  oriented 3   SKIN: No obvious rash, lesion, or ulcer.    LABORATORY PANEL:   CBC  Recent Labs Lab 12/31/15 0501  WBC 6.5  HGB 9.8*  HCT 27.1*  PLT 174   ------------------------------------------------------------------------------------------------------------------  Chemistries   Recent Labs Lab 12/30/15 1401 12/31/15 0501  NA 132*  --   K 3.4*  --   CL 96*  --   CO2 28  --   GLUCOSE 137*  --   BUN 6  --   CREATININE 0.55  --   CALCIUM 8.5*  --   MG  --  2.2  AST 135*  --   ALT 45  --   ALKPHOS 68  --   BILITOT 1.4*  --    ------------------------------------------------------------------------------------------------------------------  Cardiac Enzymes No results for input(s): TROPONINI in the last 168 hours. ------------------------------------------------------------------------------------------------------------------  RADIOLOGY:  No results found.   ASSESSMENT AND PLAN:   40 year old female with past medical history of alcohol abuse, fatty liver disease, history of seizures, lupus, hypertension, depression/anxiety who presents to the hospital after mechanical fall and noted to have a right ankle fracture.  1. Hypokalemia- Resolved w/ supplementation and normalized now.  2. Status post fall and right ankle fracture. Status post right tibial ORIF 27th of October 2017 by Dr. Rudene Christians, pain control is adequate, continue physical therapy . Discharge today as patient is through alcohol withdrawal, following  CIWA scale closely.   3. Alcohol withdrawal delirium .  CIWA scores low at 0-3, does not require Ativan administration.  Continue thiamine, folate. Patient's involuntary commitment has been lifted . Patient is going to be described few doses of Ativan at 0.5 mg up to 3 times a day, tapering at home. Librium order was discontinued, as recommended by psychiatrist due to elevated LFTs  4. Abnormal LFTs-secondary to alcohol abuse, LFTs are fluctuating and needs to be monitored as outpatient. Patient was counseled about alcohol abuse, she voiced understanding and agreement.   5. Hypothyroidism - cont. Synthroid.   6. Hx of Lupus - cont. Plaquenil.    7. Depression - cont. Cymbalta.   8. Hypomagnesemia, supplemented intravenously, level is normal now    All the records are reviewed and case discussed with Care Management/Social Worker. Management plans discussed with the patient, family and they are in agreement.  CODE STATUS: Full code  DVT Prophylaxis: TEd's & SCD's  TOTAL TIME TAKING CARE OF THIS PATIENT: 30 minutes.   POSSIBLE D/C IN 2-3 DAYS, DEPENDING ON CLINICAL CONDITION.   Theodoro Grist M.D on 12/31/2015 at 12:48 PM  Between 7am to 6pm - Pager - [phone]  After 6pm go to [email or web address] - [employer] [facility]  Office  [phone]  CC: Primary care physician; No PCP Per Patient

## 2015-12-31 NOTE — Progress Notes (Signed)
This writer removed PIV from pt's r forearm with catheter intact, pt tolerated well. Pt is dc'd at this time to entrance of facility, this writer reviewed d/c instructions with pt and her family, they received scripts for ativan and oxy.

## 2015-12-31 NOTE — Progress Notes (Signed)
Physical Therapy Treatment Patient Details Name: Alleta Barilla MRN: [id number] DOB: 1975/10/28 Today's Date: 12/31/2015    History of Present Illness Tamisha Cianciulli is a 40 y.o. female who complains of  right ankle pain. She returned to her apartment in the early hours of this morning and tripped in her bedroom, twisting the right leg and striking the right lower leg against a bench.  Pt is s/p ORIF Closed extra-articular fracture of distal tibia, right, on 12/28/2015.    PT Comments    Pt agreeable to PT; pt notes mild fatigue and increased soreness Right lower extremity due to being up this morning, but tolerable 4/10. Pt dressed with family in room and eager to go home. Pt informs therapist she has all the equipment she needs at home. Reveals frustration with staff not discharging her yet and seems quite eager to get staff through their tasks and out of the room. Pt demonstrates ability to mobilize in/out of bed well and safety with sit to/from transfer. Pt does, however, stand and hold Right lower extremity by the toes in left hand to "stretch" before ambulating. Pt does not have loss of balance or unsteadiness, but encouraged to perform such activities in sit for improved safety. Pt ambulates with large step to pattern ambulating too far into rolling walker. Cues for improved technique with step length and body position; improves step length, but continues to ambulate to the front bar of rolling walker. No loss of balance or hands on assist needed. Pt educated in stand exercises, home safety, energy conservation and best discharge plan for pt/family needs. Pt notes having crutches at home; suggested pt continue with rolling walker until assess by HHPT; pt agreeable. Continue PT to progress ambulation quality, distance and overall safety for improved functional mobility.   Follow Up Recommendations  Home health PT     Equipment Recommendations  Rolling walker with 5" wheels    Recommendations  for Other Services       Precautions / Restrictions Precautions Precautions: Fall Restrictions Weight Bearing Restrictions: Yes RLE Weight Bearing: Touchdown weight bearing    Mobility  Bed Mobility Overal bed mobility: Independent                Transfers Overall transfer level: Modified independent Equipment used: Rolling walker (2 wheeled);Crutches Transfers: Sit to/from Stand Sit to Stand: Modified independent (Device/Increase time)         General transfer comment: Good use of hands; takes time to compose and "stretch" foot in stand position with ability to stand on L foot balanced holding rw with 1 hand  Ambulation/Gait Ambulation/Gait assistance: Supervision Ambulation Distance (Feet): 170 Feet Assistive device: Rolling walker (2 wheeled);Crutches Gait Pattern/deviations: Step-to pattern Gait velocity: decreased Gait velocity interpretation: Below normal speed for age/gender General Gait Details: Ambulates too far into rw, but without LOB and does not correct with cues remaining stable throughout. Mild fatigue and audible SOB post ambulation without decrease in O2 saturation   Stairs            Wheelchair Mobility    Modified Rankin (Stroke Patients Only)       Balance   Sitting-balance support: Feet supported Sitting balance-Leahy Scale: Normal     Standing balance support: Bilateral upper extremity supported Standing balance-Leahy Scale: Good Standing balance comment: Able to stand on 1 leg and hold other leg to "stretch" with 1 hand on rw without LOB  Cognition Arousal/Alertness: Awake/alert Behavior During Therapy: Anxious (eager to go home; frustrated; eager to rid of staff) Overall Cognitive Status: Within Functional Limits for tasks assessed                      Exercises Other Exercises Other Exercises: Stand RLE hip flexion, SLR, hip ext, hip abduction 10x each Other Exercises: Education on  home environment for safety and best post discharge plan for pt; family present and agreeable Other Exercises: Education on using rw until assessed by HHPT, as pt notes she has her own crutches.     General Comments        Pertinent Vitals/Pain Pain Assessment: 0-10 Pain Score: 4  Pain Location: R foot/ankle Pain Descriptors / Indicators: Aching;Sore Pain Intervention(s): Limited activity within patient's tolerance;Monitored during session    Home Living                      Prior Function            PT Goals (current goals can now be found in the care plan section) Progress towards PT goals: Progressing toward goals    Frequency    BID      PT Plan Current plan remains appropriate    Co-evaluation             End of Session Equipment Utilized During Treatment: Gait belt Activity Tolerance: Patient tolerated treatment well Patient left: with family/visitor present;Other (comment) (siting up in bed; did not wish alarm)     Time: [id number] PT Time Calculation (min) (ACUTE ONLY): 32 min  Charges:  $Gait Training: 8-22 mins $Therapeutic Exercise: 8-22 mins                    G Codes:      Larae Grooms, PTA 12/31/2015, 10:45 AM

## 2015-12-31 NOTE — Progress Notes (Signed)
  Subjective: 3 Days Post-Op Procedure(s) (LRB): OPEN REDUCTION INTERNAL FIXATION (ORIF) TIBIA FRACTURE (Right) Patient reports pain as mild.   Patient is well, and has had no acute complaints or problems.  Admitted for involuntary commitment because of alcohol withdrawal. Plan is to go Home after hospital stay, once she is medically stable and cleared by medicine. Negative for chest pain and shortness of breath Fever: no Gastrointestinal: Negative for nausea and vomiting  Objective: Vital signs in last 24 hours: Temp:  [99 F (37.2 C)-99.7 F (37.6 C)] 99.2 F (37.3 C) (10/30 0512) Pulse Rate:  [94-118] 94 (10/30 0512) Resp:  [20] 20 (10/30 0512) BP: (120-133)/(74-95) 120/79 (10/30 0512) SpO2:  [100 %] 100 % (10/30 0512)  Intake/Output from previous day:  Intake/Output Summary (Last 24 hours) at 12/31/15 0809 Last data filed at 12/31/15 0303  Gross per 24 hour  Intake              460 ml  Output             2350 ml  Net            -1890 ml    Intake/Output this shift: No intake/output data recorded.  Labs:  Recent Labs  12/30/15 0400 12/31/15 0501  HGB 9.6* 9.8*    Recent Labs  12/30/15 0400 12/31/15 0501  WBC 7.5 6.5  RBC 2.73* 2.73*  HCT 27.0* 27.1*  PLT 111* 174    Recent Labs  12/30/15 1401  NA 132*  K 3.4*  CL 96*  CO2 28  BUN 6  CREATININE 0.55  GLUCOSE 137*  CALCIUM 8.5*   No results for input(s): LABPT, INR in the last 72 hours.   EXAM General - Patient is Alert and Oriented Extremity - Sensation intact distally Dorsiflexion/Plantar flexion intact No cellulitis present Compartment soft Dressing/Incision - clean, dry, no drainage Motor Function - intact, moving toes well on exam.   Past Medical History:  Diagnosis Date  . Alcohol use disorder ([place])   . Alcohol withdrawal delirium ([place])   . Anxiety   . 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 Chronic kidney disease    rheumatologist follows her, lasix for this  . Depression    . Hypertension   . Lupus   . Raynaud's phenomenon   . Seizures ([place])   . Steatosis of liver     Assessment/Plan: 3 Days Post-Op Procedure(s) (LRB): OPEN REDUCTION INTERNAL FIXATION (ORIF) TIBIA FRACTURE (Right) Active Problems:   Closed extra-articular fracture of distal tibia, right, initial encounter  Estimated body mass index is 23.78 kg/m as calculated from the following:   Height as of this encounter: 5\' 2"  (1.575 m).   Weight as of this encounter: 59 kg (130 lb). Up with therapy Plan on discharge to home when cleared by medicine Follow up with [facility] in 2 weeks for staple removal  DVT Prophylaxis - Lovenox and TED hose Toe-touch Weight-Bearing right leg  Duanne Guess, PA-C Orthopaedic Surgery 12/31/2015, 8:09 AM

## 2015-12-31 NOTE — Progress Notes (Signed)
Nutrition Follow-up  DOCUMENTATION CODES:   Not applicable  INTERVENTION:  -Continue Boost Breeze po TID, each supplement provides 250 kcal and 9 grams of protein  NUTRITION DIAGNOSIS:   Inadequate oral intake related to poor appetite, chronic illness, acute illness as evidenced by per patient/family report. -resolving, patient at 100% this morning  GOAL:   Patient will meet greater than or equal to 90% of their needs -progressing  MONITOR:   PO intake, I & O's, Labs, Weight trends, Supplement acceptance  REASON FOR ASSESSMENT:   Malnutrition Screening Tool    ASSESSMENT:   Chloe [name] is a 40 y.o. female who complains of  right ankle pain. She returned to her apartment in the early hours of this morning and tripped in her bedroom, twisting the right leg and striking the right lower leg against a bench  Followed up with pt briefly She ate 100% of breakfast this morning, also consumed 2 of 3 boost breeze yesterday. Had 1 this morning. Also consuming gatorade regularly States she is discharging today - no summary in yet Evaluated by Psych yesterday - patient wanted to leave AMA and also suffering from alcohol withdrawal, seems to be doing better today, no evidence of tremors.  Labs and medications reviewed.  Diet Order:  Diet regular Room service appropriate? Yes; Fluid consistency: Thin  Skin:  Reviewed, no issues  Last BM:  12/26/2015  Height:   Ht Readings from Last 1 Encounters:  12/28/15 5\' 2"  (1.575 m)    Weight:   Wt Readings from Last 1 Encounters:  12/28/15 130 lb (59 kg)    Ideal Body Weight:  50 kg  BMI:  Body mass index is 23.78 kg/m.  Estimated Nutritional Needs:   Kcal:  1600-2000 calories  Protein:  60-71 gm  Fluid:  >/= 1.6L  EDUCATION NEEDS:   No education needs identified at this time  Chloe [name]. [name], MS, RD LDN Inpatient Clinical Dietitian Pager [phone]

## 2016-05-26 ENCOUNTER — Ambulatory Visit
Admission: RE | Admit: 2016-05-26 | Discharge: 2016-05-26 | Disposition: A | Discharge status: Home / Self Care | Payer: Disability Insurance | Source: Ambulatory Visit | Attending: Thoracic Surgery | Admitting: Thoracic Surgery

## 2016-05-26 ENCOUNTER — Other Ambulatory Visit: Payer: Self-pay | Admitting: Thoracic Surgery

## 2016-05-26 DIAGNOSIS — Z9889 Other specified postprocedural states: Secondary | ICD-10-CM | POA: Diagnosis not present

## 2016-05-26 DIAGNOSIS — M19071 Primary osteoarthritis, right ankle and foot: Secondary | ICD-10-CM

## 2016-05-26 DIAGNOSIS — M150 Primary generalized (osteo)arthritis: Secondary | ICD-10-CM | POA: Diagnosis not present

## 2016-05-26 DIAGNOSIS — M25571 Pain in right ankle and joints of right foot: Secondary | ICD-10-CM | POA: Diagnosis not present

## 2016-05-26 DIAGNOSIS — T148XXS Other injury of unspecified body region, sequela: Secondary | ICD-10-CM | POA: Diagnosis not present

## 2016-05-26 DIAGNOSIS — M25771 Osteophyte, right ankle: Secondary | ICD-10-CM | POA: Insufficient documentation

## 2016-05-26 DIAGNOSIS — R531 Weakness: Secondary | ICD-10-CM | POA: Diagnosis present

## 2016-05-26 DIAGNOSIS — S82201S Unspecified fracture of shaft of right tibia, sequela: Secondary | ICD-10-CM | POA: Insufficient documentation

## 2017-02-15 ENCOUNTER — Other Ambulatory Visit: Payer: Self-pay

## 2017-02-15 ENCOUNTER — Ambulatory Visit (INDEPENDENT_AMBULATORY_CARE_PROVIDER_SITE_OTHER): Payer: Self-pay

## 2017-02-15 ENCOUNTER — Ambulatory Visit
Admission: EM | Admit: 2017-02-15 | Discharge: 2017-02-15 | Disposition: A | Discharge status: Home / Self Care | Payer: Self-pay | Attending: Family Medicine | Admitting: Family Medicine

## 2017-02-15 DIAGNOSIS — R748 Abnormal levels of other serum enzymes: Secondary | ICD-10-CM | POA: Insufficient documentation

## 2017-02-15 DIAGNOSIS — R112 Nausea with vomiting, unspecified: Secondary | ICD-10-CM

## 2017-02-15 DIAGNOSIS — Z888 Allergy status to other drugs, medicaments and biological substances status: Secondary | ICD-10-CM | POA: Insufficient documentation

## 2017-02-15 DIAGNOSIS — N189 Chronic kidney disease, unspecified: Secondary | ICD-10-CM | POA: Insufficient documentation

## 2017-02-15 DIAGNOSIS — D696 Thrombocytopenia, unspecified: Secondary | ICD-10-CM

## 2017-02-15 DIAGNOSIS — Z7982 Long term (current) use of aspirin: Secondary | ICD-10-CM | POA: Insufficient documentation

## 2017-02-15 DIAGNOSIS — R05 Cough: Secondary | ICD-10-CM | POA: Insufficient documentation

## 2017-02-15 DIAGNOSIS — E876 Hypokalemia: Secondary | ICD-10-CM

## 2017-02-15 DIAGNOSIS — R0602 Shortness of breath: Secondary | ICD-10-CM | POA: Insufficient documentation

## 2017-02-15 DIAGNOSIS — J45909 Unspecified asthma, uncomplicated: Secondary | ICD-10-CM | POA: Insufficient documentation

## 2017-02-15 DIAGNOSIS — R569 Unspecified convulsions: Secondary | ICD-10-CM | POA: Insufficient documentation

## 2017-02-15 DIAGNOSIS — Z79890 Hormone replacement therapy (postmenopausal): Secondary | ICD-10-CM | POA: Insufficient documentation

## 2017-02-15 DIAGNOSIS — F4323 Adjustment disorder with mixed anxiety and depressed mood: Secondary | ICD-10-CM | POA: Insufficient documentation

## 2017-02-15 DIAGNOSIS — Z79899 Other long term (current) drug therapy: Secondary | ICD-10-CM | POA: Insufficient documentation

## 2017-02-15 DIAGNOSIS — I129 Hypertensive chronic kidney disease with stage 1 through stage 4 chronic kidney disease, or unspecified chronic kidney disease: Secondary | ICD-10-CM | POA: Insufficient documentation

## 2017-02-15 DIAGNOSIS — I7300 Raynaud's syndrome without gangrene: Secondary | ICD-10-CM | POA: Insufficient documentation

## 2017-02-15 DIAGNOSIS — M329 Systemic lupus erythematosus, unspecified: Secondary | ICD-10-CM | POA: Insufficient documentation

## 2017-02-15 DIAGNOSIS — E871 Hypo-osmolality and hyponatremia: Secondary | ICD-10-CM | POA: Insufficient documentation

## 2017-02-15 DIAGNOSIS — Z91030 Bee allergy status: Secondary | ICD-10-CM | POA: Insufficient documentation

## 2017-02-15 LAB — COMPREHENSIVE METABOLIC PANEL
ALBUMIN: 4 g/dL (ref 3.5–5.0)
ALK PHOS: 190 U/L — AB (ref 38–126)
ALT: 43 U/L (ref 14–54)
ANION GAP: 15 (ref 5–15)
AST: 251 U/L — ABNORMAL HIGH (ref 15–41)
BUN: 7 mg/dL (ref 6–20)
CO2: 30 mmol/L (ref 22–32)
Calcium: 8.2 mg/dL — ABNORMAL LOW (ref 8.9–10.3)
Chloride: 80 mmol/L — ABNORMAL LOW (ref 101–111)
Creatinine, Ser: 0.59 mg/dL (ref 0.44–1.00)
GFR calc Af Amer: >60 mL/min (ref >60)
GFR calc non Af Amer: >60 mL/min (ref >60)
GLUCOSE: 150 mg/dL — AB (ref 65–99)
POTASSIUM: 2.2 mmol/L — AB (ref 3.5–5.1)
SODIUM: 125 mmol/L — AB (ref 135–145)
Total Bilirubin: 6.6 mg/dL — ABNORMAL HIGH (ref 0.3–1.2)
Total Protein: 8.9 g/dL — ABNORMAL HIGH (ref 6.5–8.1)

## 2017-02-15 LAB — CBC WITH DIFFERENTIAL/PLATELET
BASOS ABS: 0 10*3/uL (ref 0–0.1)
BASOS PCT: 2 %
EOS ABS: 0 10*3/uL (ref 0–0.7)
Eosinophils Relative: 0 %
HCT: 37 % (ref 35.0–47.0)
HEMOGLOBIN: 13 g/dL (ref 12.0–16.0)
Lymphocytes Relative: 19 %
Lymphs Abs: 0.6 10*3/uL — ABNORMAL LOW (ref 1.0–3.6)
MCH: 37.5 pg — ABNORMAL HIGH (ref 26.0–34.0)
MCHC: 35 g/dL (ref 32.0–36.0)
MCV: 107 fL — ABNORMAL HIGH (ref 80.0–100.0)
MONOS PCT: 10 %
Monocytes Absolute: 0.3 10*3/uL (ref 0.2–0.9)
NEUTROS PCT: 69 %
Neutro Abs: 2.4 10*3/uL (ref 1.4–6.5)
Platelets: 55 10*3/uL — ABNORMAL LOW (ref 150–440)
RBC: 3.46 MIL/uL — ABNORMAL LOW (ref 3.80–5.20)
RDW: 16 % — AB (ref 11.5–14.5)
WBC: 3.4 10*3/uL — ABNORMAL LOW (ref 3.6–11.0)

## 2017-02-15 LAB — MAGNESIUM: Magnesium: 1.2 mg/dL — ABNORMAL LOW (ref 1.7–2.4)

## 2017-02-15 LAB — LIPASE, BLOOD: Lipase: 27 U/L (ref 11–51)

## 2017-02-15 MED ORDER — ONDANSETRON 8 MG PO TBDP
8.0000 mg | ORAL_TABLET | Freq: Once | ORAL | Status: AC
  Administered 2017-02-15: 8 mg via ORAL

## 2017-02-15 MED ORDER — POTASSIUM CHLORIDE CRYS ER 20 MEQ PO TBCR
40.0000 meq | EXTENDED_RELEASE_TABLET | Freq: Two times a day (BID) | ORAL | Status: DC
  Administered 2017-02-15: 40 meq via ORAL

## 2017-02-15 NOTE — ED Triage Notes (Signed)
Patient c/o cough, congestion, ear pressure, SOB and vomiting x 2 days. Patient states she has had pneumonia before and this feels just like it.

## 2017-02-15 NOTE — Discharge Instructions (Signed)
Discussed with patient recommendation to go to Emergency Department for further evaluation and management °

## 2017-02-15 NOTE — ED Provider Notes (Signed)
[facility]    CSN: [id number] Arrival date & time: 02/15/17  0813     History   Chief Complaint Chief Complaint  Patient presents with  . Nasal Congestion    HPI Chloe [name] is a 41 y.o. female.   41 yo female with a c/o cough, congestion, ear pressure, shortness of breath, vomiting and diarrhea for the last 2-3 days. Patient states she's vomited and had diarrhea about 10 times.    The history is provided by the patient.    Past Medical History:  Diagnosis Date  . Alcohol use disorder   . Alcohol withdrawal delirium ([place])   . Anxiety   . 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 Chronic kidney disease    rheumatologist follows her, lasix for this  . Depression   . Hypertension   . Lupus   . Raynaud's phenomenon   . Seizures ([place])   . Steatosis of liver     Patient Active Problem List   Diagnosis Date Noted  . Closed extra-articular fracture of distal tibia, right, initial encounter 12/27/2015  . Alcohol withdrawal delirium ([place]) 11/30/2015  . Alcohol withdrawal ([place]) 11/30/2015  . Adjustment disorder with mixed anxiety and depressed mood 11/27/2015  . Alcohol use disorder, severe, dependence ([place]) 11/26/2015  . Substance induced mood disorder ([place]) 11/26/2015  . Lupus 11/26/2015  . Seizures ([place]) 11/26/2015  . Elevated liver enzymes 06/22/2015  . Steatosis of liver 06/22/2015  . History of positive PPD 10/27/2011  . Hypertropia 01/16/2011  . Reactive airway disease 01/16/2011  . Reactive depression (situational) 01/16/2011  . Situational depression 01/16/2011    Past Surgical History:  Procedure Laterality Date  . BREAST ENHANCEMENT SURGERY    . ORIF TIBIA FRACTURE Right 12/28/2015   Procedure: OPEN REDUCTION INTERNAL FIXATION (ORIF) TIBIA FRACTURE;  Surgeon: Hessie Knows, MD;  Location: [facility] ORS;  Service: Orthopedics;  Laterality: Right;    OB History    Gravida Para Term Preterm AB Living   2       2     SAB TAB Ectopic Multiple  Live Births                   Home Medications    Prior to Admission medications   Medication Sig Start Date End Date Taking? Authorizing Provider  aspirin EC 325 MG tablet Take 1 tablet (325 mg total) by mouth daily. 12/29/15  Yes Reche Dixon, PA-C  DULoxetine (CYMBALTA) 30 MG capsule Take 1 capsule (30 mg total) by mouth daily. 12/03/15  Yes Hower, Aaron Mose, MD  folic acid (FOLVITE) 1 MG tablet Take 1 tablet (1 mg total) by mouth daily. 12/03/15  Yes Hower, Aaron Mose, MD  furosemide (LASIX) 40 MG tablet Take 1 tablet by mouth daily. 10/11/15  Yes [provider]  gabapentin (NEURONTIN) 300 MG capsule Take 300 mg by mouth 3 (three) times daily.   Yes [provider]  hydroxychloroquine (PLAQUENIL) 200 MG tablet Take 1 tablet (200 mg total) by mouth daily. 12/03/15  Yes Hower, Aaron Mose, MD  levETIRAcetam (KEPPRA) 500 MG tablet Take 1 tablet (500 mg total) by mouth 2 (two) times daily. 12/02/15  Yes Hower, Aaron Mose, MD  levothyroxine (SYNTHROID, LEVOTHROID) 25 MCG tablet Take 1 tablet (25 mcg total) by mouth daily before breakfast. 12/03/15  Yes Hower, Aaron Mose, MD  LORazepam (ATIVAN) 0.5 MG tablet Take 1 tablet (0.5 mg total) by mouth every 8 (eight) hours as needed for  anxiety or sleep. 12/31/15   Theodoro Grist, MD  mycophenolate (CELLCEPT) 250 MG capsule Take 250 mg by mouth 2 (two) times daily.    [provider]  oxyCODONE (OXY IR/ROXICODONE) 5 MG immediate release tablet Take 1-2 tablets (5-10 mg total) by mouth every 3 (three) hours as needed for breakthrough pain. 12/29/15   Reche Dixon, PA-C  thiamine 100 MG tablet Take 1 tablet (100 mg total) by mouth daily. 12/03/15   Hower, Aaron Mose, MD    Family History Family History  Problem Relation Age of Onset  . Alzheimer's disease Maternal Grandfather   . Diabetes Paternal Grandmother     Social History Social History   Tobacco Use  . Smoking status: Never Smoker  . Smokeless tobacco: Never Used  Substance Use  Topics  . Alcohol use: Yes    Comment: 2-3 times a week  . Drug use: No     Allergies   Bee venom and Sumatriptan   Review of Systems Review of Systems   Physical Exam Triage Vital Signs ED Triage Vitals  Enc Vitals Group     BP 02/15/17 0829 116/75     Pulse Rate 02/15/17 0829 (!) 109     Resp --      Temp 02/15/17 0829 98.6 F (37 C)     Temp Source 02/15/17 0829 Oral     SpO2 02/15/17 0829 100 %     Weight 02/15/17 0831 125 lb (56.7 kg)     Height 02/15/17 0831 5' 2.5" (1.588 m)     Head Circumference --      Peak Flow --      Pain Score --      Pain Loc --      Pain Edu? --      Excl. in [place]? --    No data found.  Updated Vital Signs BP 116/75 (BP Location: Right Arm)   Pulse (!) 109   Temp 98.6 F (37 C) (Oral)   Ht 5' 2.5" (1.588 m)   Wt 125 lb (56.7 kg)   LMP 02/15/2017   SpO2 100%   BMI 22.50 kg/m   Visual Acuity Right Eye Distance:   Left Eye Distance:   Bilateral Distance:    Right Eye Near:   Left Eye Near:    Bilateral Near:     Physical Exam  Constitutional: She is oriented to person, place, and time. She appears well-developed and well-nourished. No distress.  HENT:  Head: Normocephalic.  Mouth/Throat: Oropharynx is clear and moist and mucous membranes are normal.  Eyes: Conjunctivae and EOM are normal. Pupils are equal, round, and reactive to light. Right eye exhibits no discharge. Left eye exhibits no discharge. No scleral icterus.  Neck: Normal range of motion. Neck supple. No JVD present. No tracheal deviation present. No thyromegaly present.  Cardiovascular: Regular rhythm, normal heart sounds and intact distal pulses. Tachycardia present.  No murmur heard. Pulmonary/Chest: Effort normal and breath sounds normal. No stridor. No respiratory distress. She has no wheezes. She has no rales. She exhibits no tenderness.  Abdominal: Soft. Bowel sounds are normal. She exhibits no distension and no mass. There is tenderness (mild, diffuse).  There is no rebound and no guarding.  Musculoskeletal: She exhibits no edema or tenderness.  Lymphadenopathy:    She has no cervical adenopathy.  Neurological: She is alert and oriented to person, place, and time. She has normal reflexes.  Skin: Skin is warm and dry. No rash noted. She is  not diaphoretic. No erythema. No pallor.  Vitals reviewed.    UC Treatments / Results  Labs (all labs ordered are listed, but only abnormal results are displayed) Labs Reviewed  CBC WITH DIFFERENTIAL/PLATELET - Abnormal; Notable for the following components:      Result Value   WBC 3.4 (*)    RBC 3.46 (*)    MCV 107.0 (*)    MCH 37.5 (*)    RDW 16.0 (*)    Platelets 55 (*)    Lymphs Abs 0.6 (*)    All other components within normal limits  COMPREHENSIVE METABOLIC PANEL - Abnormal; Notable for the following components:   Sodium 125 (*)    Potassium 2.2 (*)    Chloride 80 (*)    Glucose, Bld 150 (*)    Calcium 8.2 (*)    Total Protein 8.9 (*)    AST 251 (*)    Alkaline Phosphatase 190 (*)    Total Bilirubin 6.6 (*)    All other components within normal limits  MAGNESIUM - Abnormal; Notable for the following components:   Magnesium 1.2 (*)    All other components within normal limits  LIPASE, BLOOD    EKG  EKG Interpretation None       Radiology Dg Chest 2 View  Result Date: 02/15/2017 CLINICAL DATA:  Cough EXAM: CHEST  2 VIEW COMPARISON:  None. FINDINGS: Cardiac shadow is within normal limits. Bilateral breast implants are noted. The lungs are well aerated bilaterally. No focal infiltrate or sizable effusion is seen. No acute bony abnormality IMPRESSION: No active cardiopulmonary disease. Electronically Signed   By: Inez Catalina M.D.   On: 02/15/2017 09:30    Procedures ED EKG Date/Time: 02/15/2017 5:02 PM Performed by: Norval Gable, MD Authorized by: Norval Gable, MD   ECG reviewed by ED Physician in the absence of a cardiologist: yes   Previous ECG:    Previous ECG:   Unavailable Interpretation:    Interpretation: normal   Rate:    ECG rate assessment: normal   Rhythm:    Rhythm: sinus rhythm   Ectopy:    Ectopy: none   QRS:    QRS axis:  Normal Conduction:    Conduction: normal   ST segments:    ST segments:  Normal T waves:    T waves: normal      (including critical care time)  Medications Ordered in UC Medications  ondansetron (ZOFRAN-ODT) disintegrating tablet 8 mg (8 mg Oral Given 02/15/17 0856)     Initial Impression / Assessment and Plan / UC Course  I have reviewed the triage vital signs and the nursing notes.  Pertinent labs & imaging results that were available during my care of the patient were reviewed by me and considered in my medical decision making (see chart for details).      Final Clinical Impressions(s) / UC Diagnoses   Final diagnoses:  Nausea and vomiting, intractability of vomiting not specified, unspecified vomiting type  Hypokalemia  Hyponatremia  Thrombocytopenia (HCC)  Elevated liver enzymes    ED Discharge Orders    None     1. Labs/x-ray results and diagnoses reviewed with patient; patient given 40 MEQ po KCL and zofran 8mg  odt x 1   2.  due to severe abnormality of lab tests recommend patient be transported to Emergency Department by private vehicle from here. Recommend patient not drive at this time, however patient refuses. States she needs to drive home first. Patient verbalizes understanding of  risks and states she's willing to leave facility AMA.    Controlled Substance Prescriptions [place] Controlled Substance Registry consulted? Not Applicable   Norval Gable, MD 02/15/17 [id number]

## 2017-04-29 IMAGING — CR DG TIBIA/FIBULA 2V*R*
3 series · 3 of 3 positions shown · non-contrast
Comparison: None.

CLINICAL DATA: 40-year-old female with fall and right lower
extremity trauma.

EXAM:
RIGHT TIBIA AND FIBULA - 2 VIEW

[tibia ap]
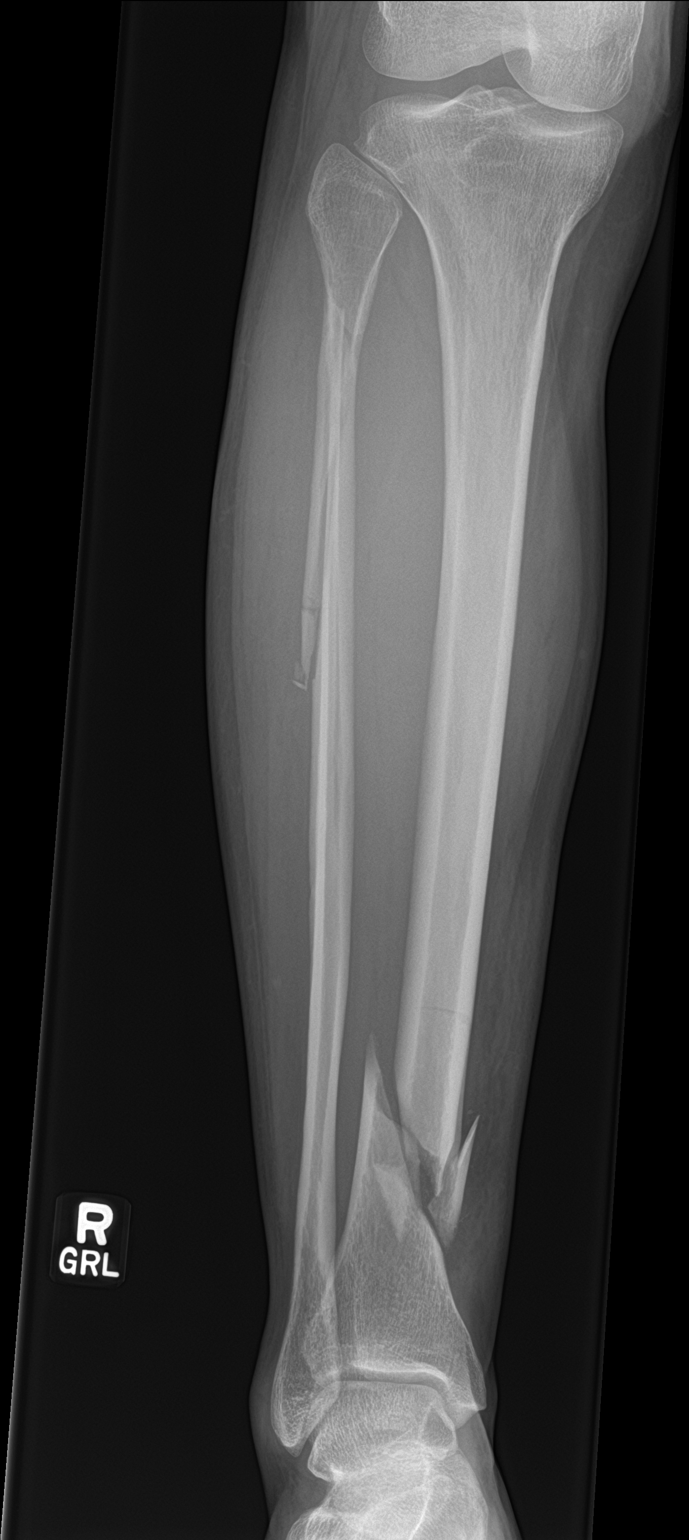

[tibia lat (1 of 2)]
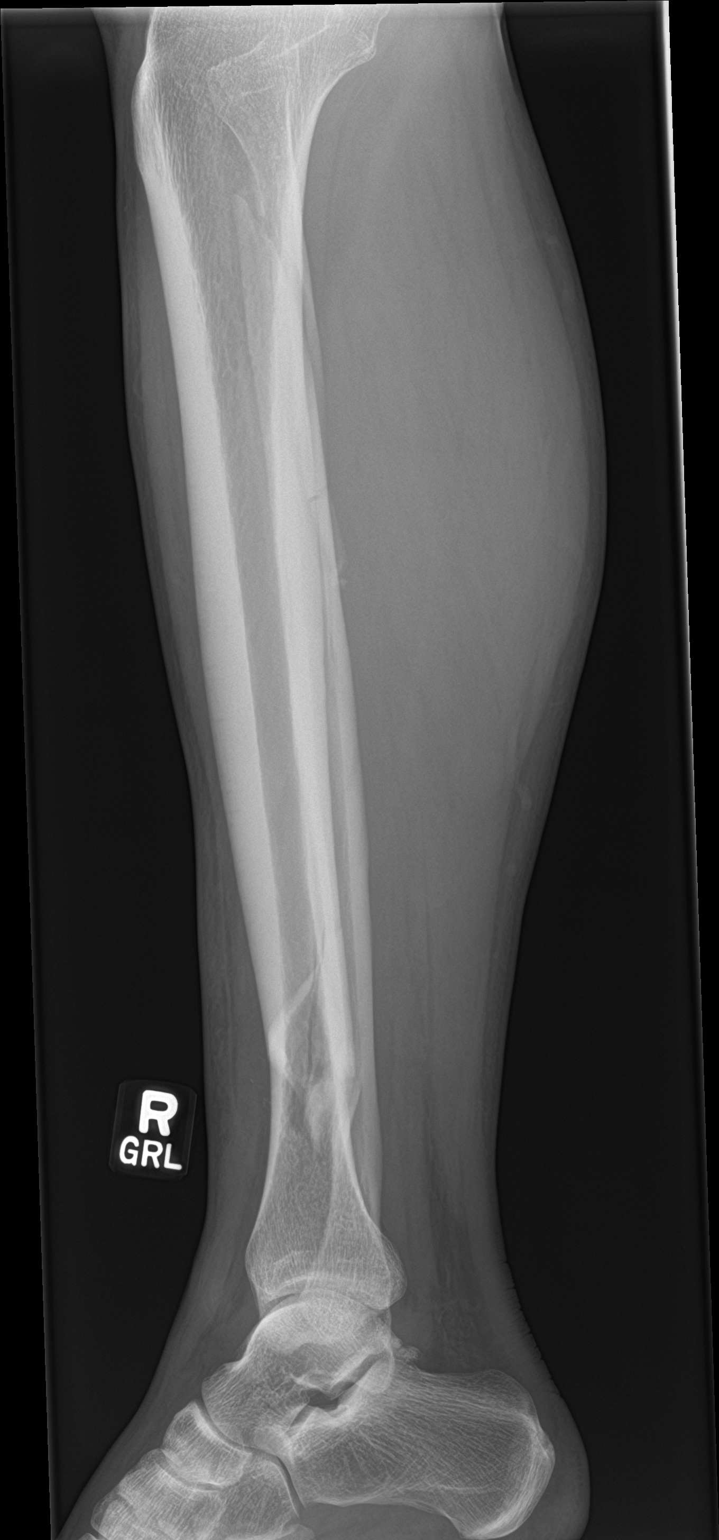

[tibia lat (2 of 2)]
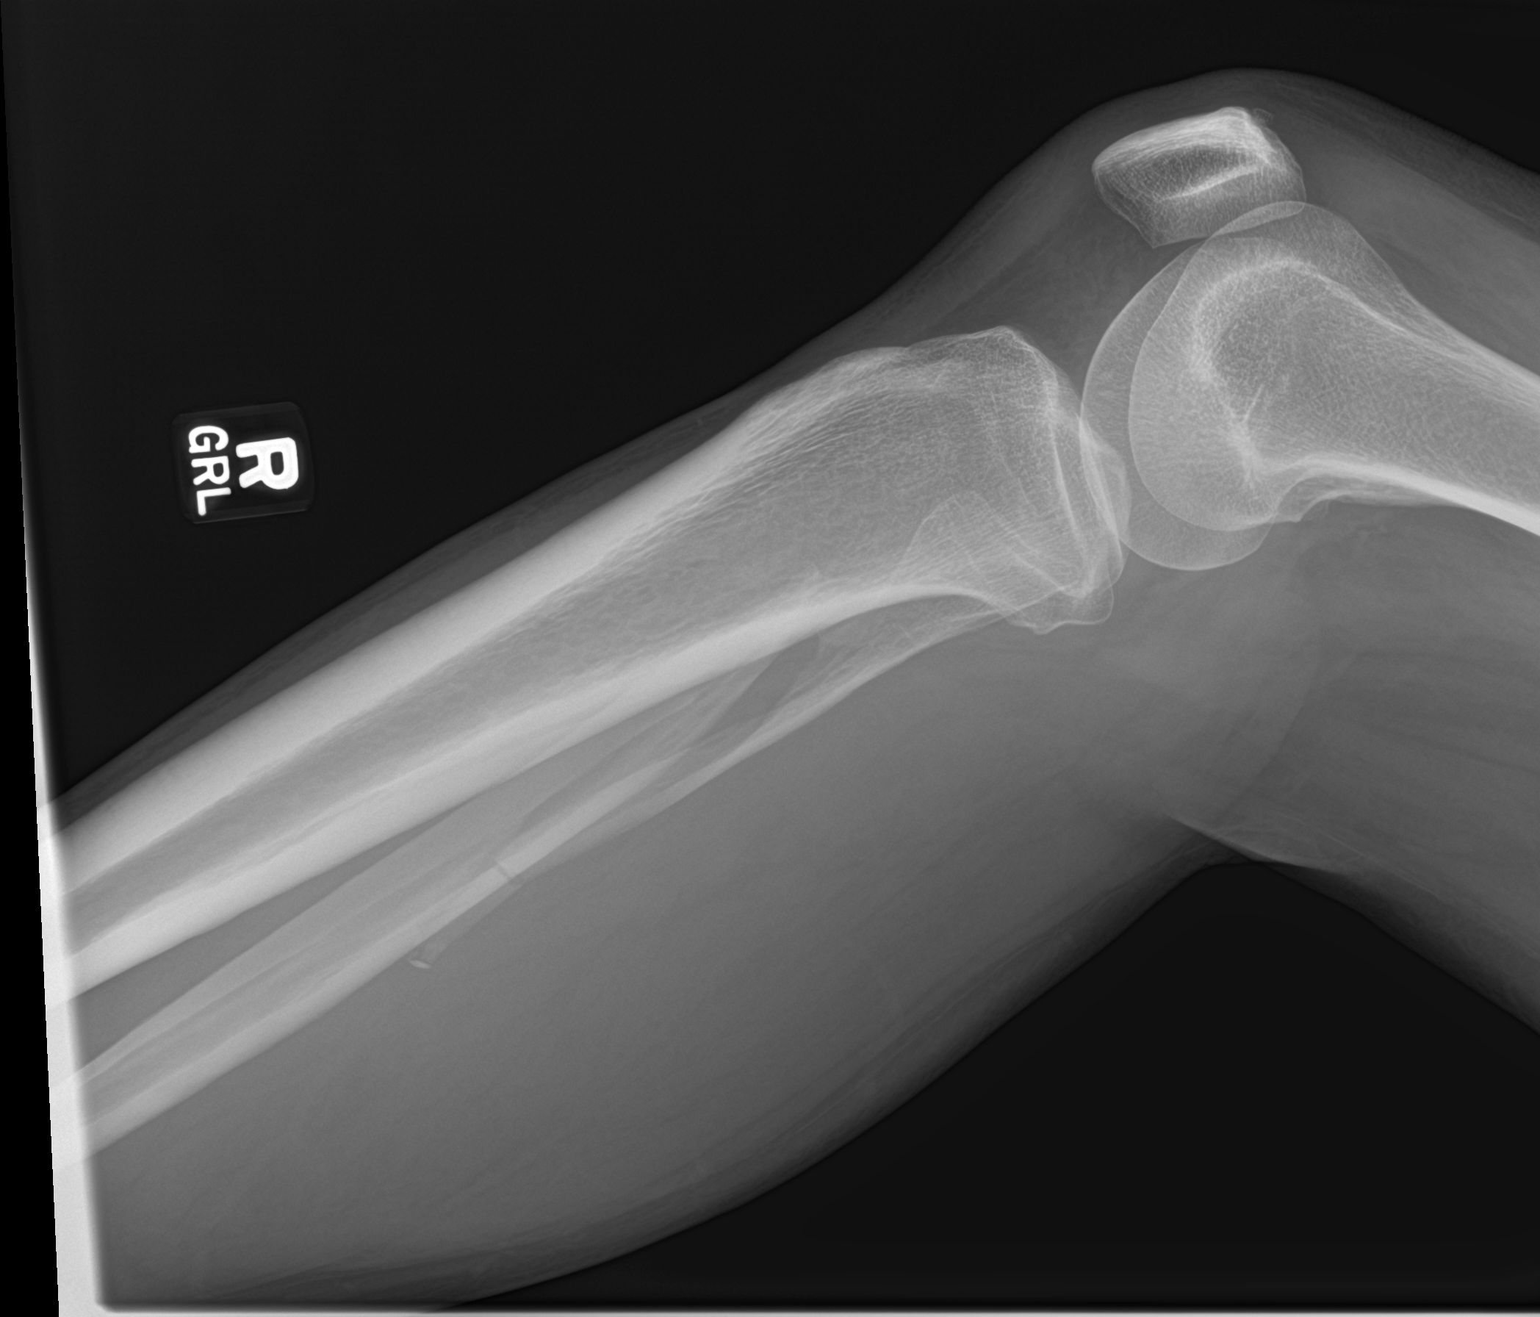

[3 of 3 positions shown; findings below may reference images not displayed]

FINDINGS: There is a displaced spiral fracture of the proximal and mid fibular
shaft with anterior displacement of the distal fracture fragment and
approximately 6 mm distraction gap. There is a comminuted and
displaced oblique fracture of the distal tibia measure approximately
1 cm lateral displacement of the distal fracture fragment. There is
no dislocation. The ankle mortise appears intact. There is soft
tissue swelling of the right leg. No radiopaque foreign object.
IMPRESSION: Fractures of the tibia and fibula as described.

## 2017-09-27 IMAGING — CR DG ANKLE COMPLETE 3+V*R*
3 series · 3 of 3 positions shown · non-contrast
Comparison: 12/28/2015 .

CLINICAL DATA: Prior fracture.

EXAM:
RIGHT ANKLE - COMPLETE 3+ VIEW

[ankle ap]
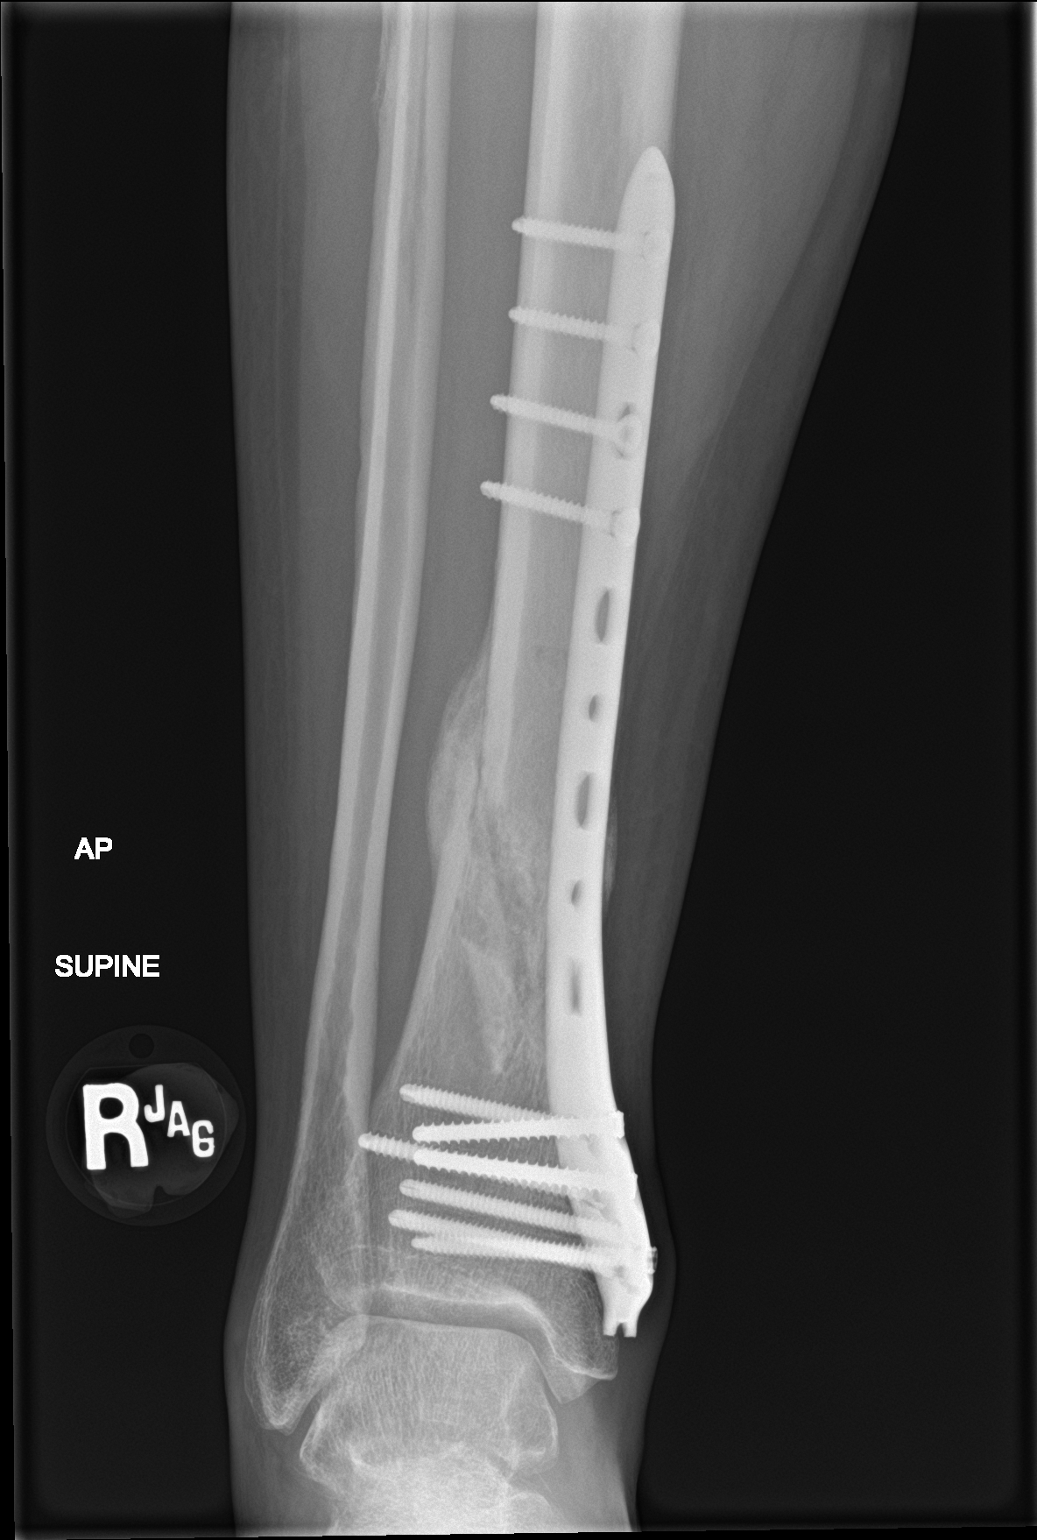

[ankle obl]
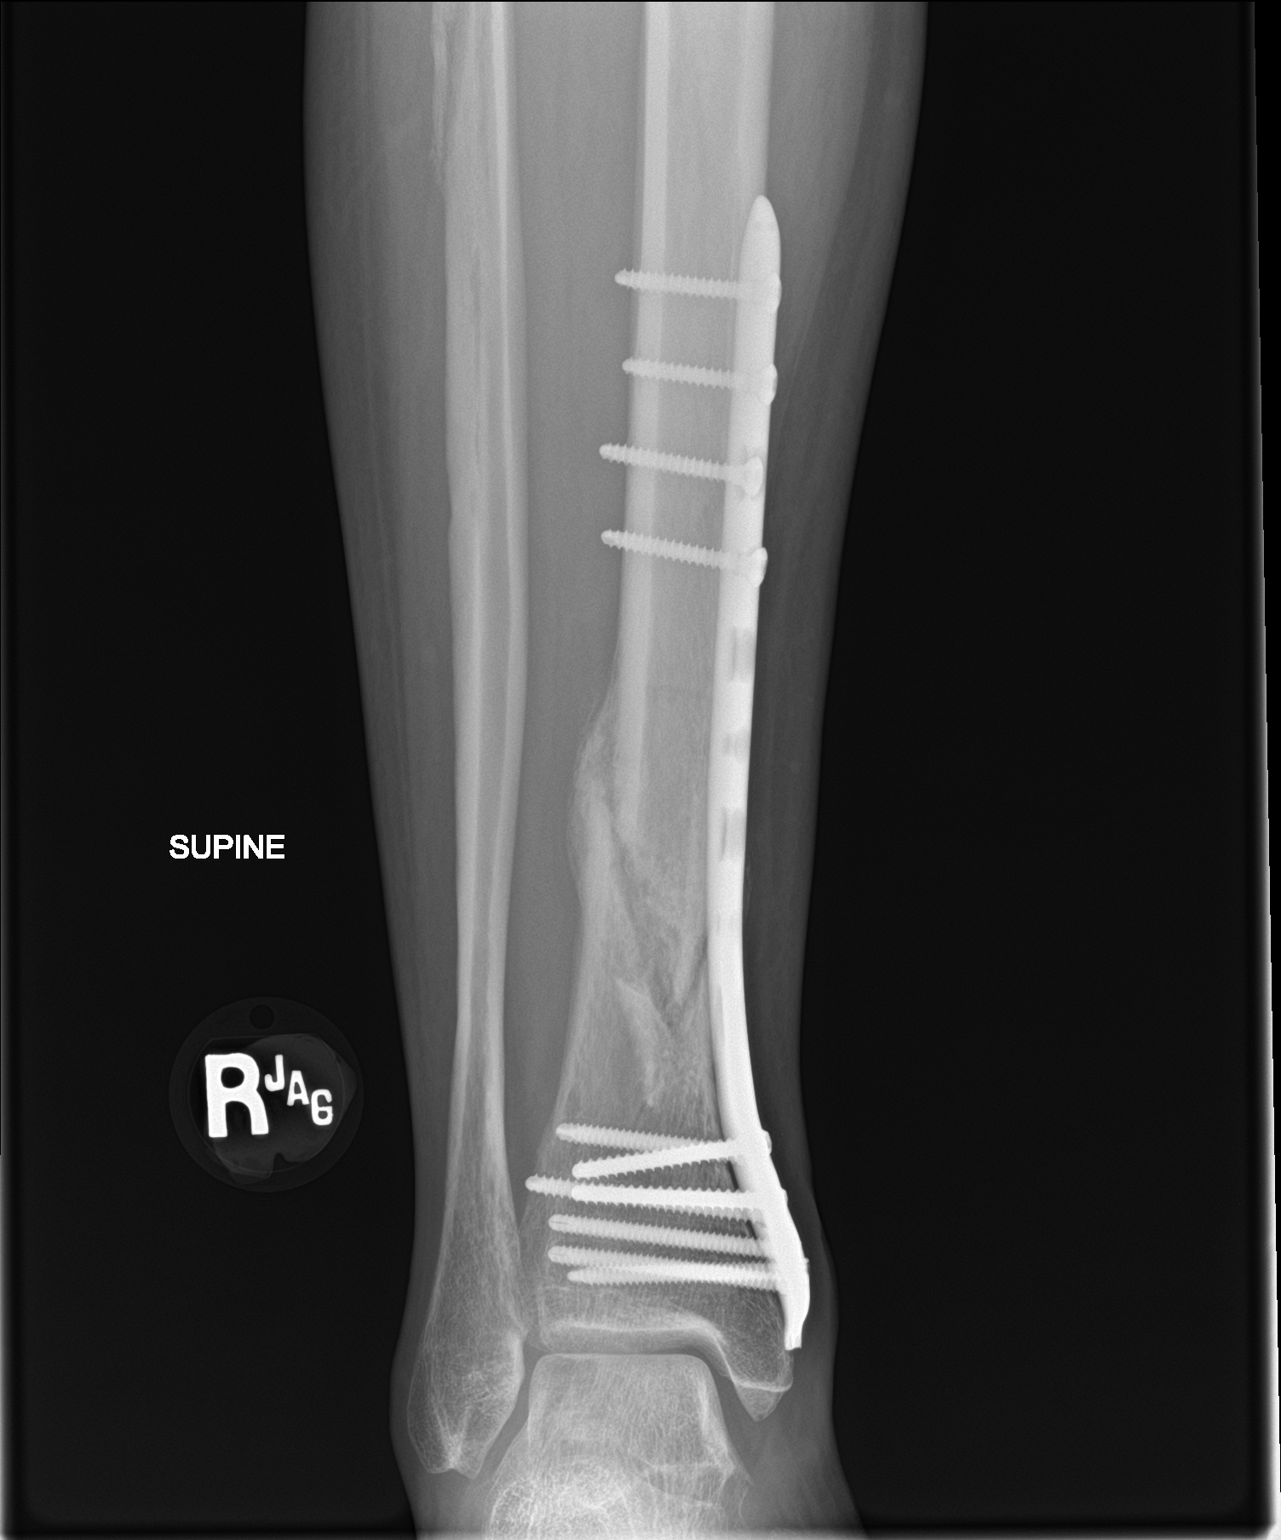

[ankle lat]
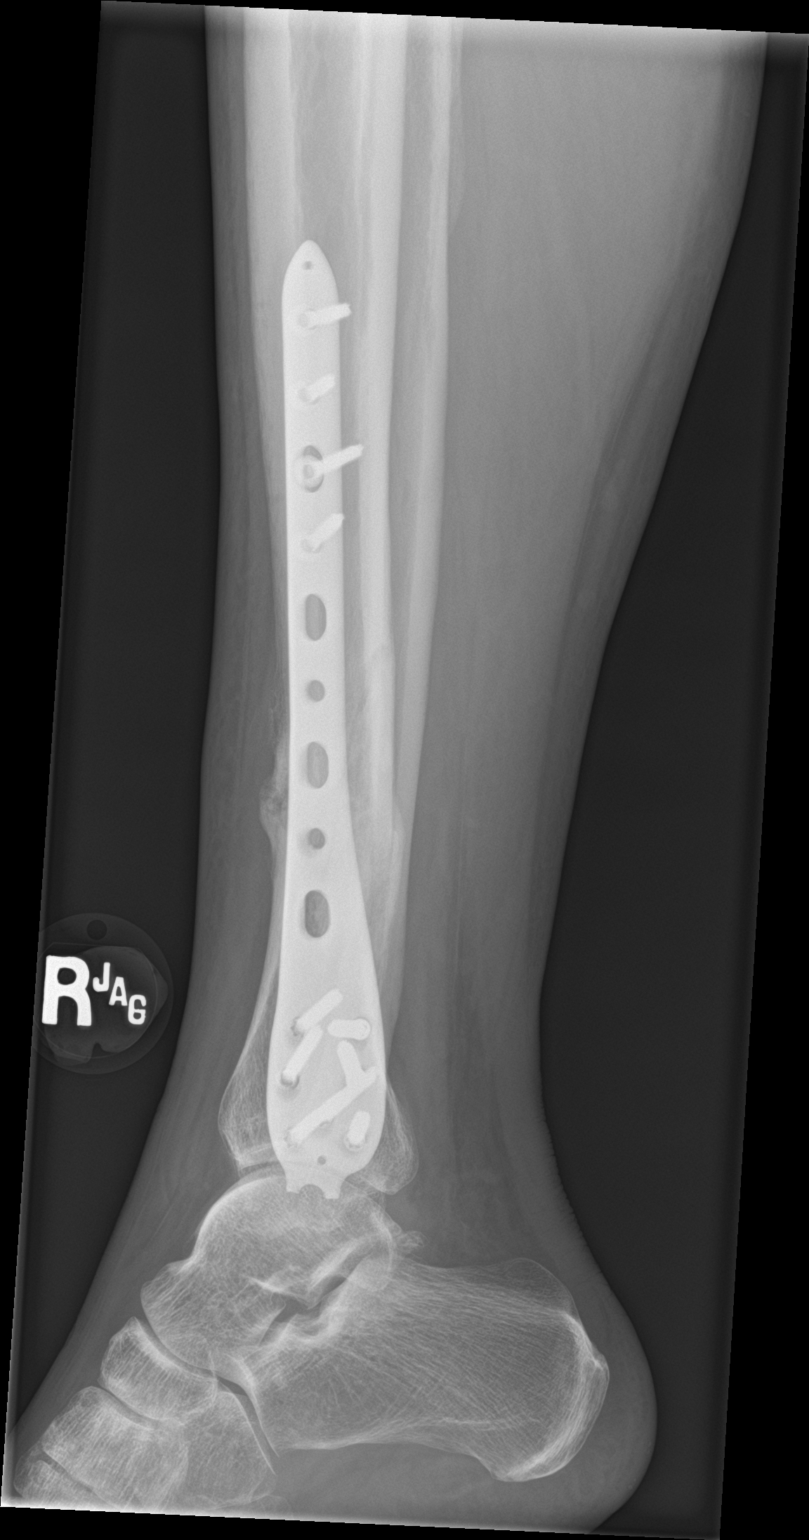

[3 of 3 positions shown; findings below may reference images not displayed]

FINDINGS: Plate and screw fixation of the distal tibia is noted. Hardware
intact. Some degree of callus formation noted about distal tibial
fracture. Fracture lucency however remains. Dated appears stable.
IMPRESSION: ORIF right tibia. Some degree of callus formation is noted about the
tibial fracture. Fracture lucency however does remain .

## 2019-08-09 ENCOUNTER — Emergency Department: Payer: Medicare PPO

## 2019-08-09 ENCOUNTER — Other Ambulatory Visit: Payer: Self-pay

## 2019-08-09 ENCOUNTER — Inpatient Hospital Stay
Admission: EM | Admit: 2019-08-09 | Discharge: 2019-08-20 | DRG: 871 | Disposition: A | Discharge status: Hospice | Payer: Medicare PPO | Attending: Internal Medicine | Admitting: Internal Medicine

## 2019-08-09 DIAGNOSIS — I864 Gastric varices: Secondary | ICD-10-CM | POA: Diagnosis present

## 2019-08-09 DIAGNOSIS — Z20822 Contact with and (suspected) exposure to covid-19: Secondary | ICD-10-CM | POA: Diagnosis present

## 2019-08-09 DIAGNOSIS — E871 Hypo-osmolality and hyponatremia: Secondary | ICD-10-CM | POA: Diagnosis present

## 2019-08-09 DIAGNOSIS — Z538 Procedure and treatment not carried out for other reasons: Secondary | ICD-10-CM | POA: Diagnosis not present

## 2019-08-09 DIAGNOSIS — K56609 Unspecified intestinal obstruction, unspecified as to partial versus complete obstruction: Secondary | ICD-10-CM

## 2019-08-09 DIAGNOSIS — I8510 Secondary esophageal varices without bleeding: Secondary | ICD-10-CM | POA: Diagnosis present

## 2019-08-09 DIAGNOSIS — K529 Noninfective gastroenteritis and colitis, unspecified: Secondary | ICD-10-CM | POA: Diagnosis present

## 2019-08-09 DIAGNOSIS — D684 Acquired coagulation factor deficiency: Secondary | ICD-10-CM | POA: Diagnosis present

## 2019-08-09 DIAGNOSIS — Z66 Do not resuscitate: Secondary | ICD-10-CM | POA: Diagnosis not present

## 2019-08-09 DIAGNOSIS — R52 Pain, unspecified: Secondary | ICD-10-CM

## 2019-08-09 DIAGNOSIS — E870 Hyperosmolality and hypernatremia: Secondary | ICD-10-CM | POA: Diagnosis not present

## 2019-08-09 DIAGNOSIS — A419 Sepsis, unspecified organism: Secondary | ICD-10-CM | POA: Diagnosis not present

## 2019-08-09 DIAGNOSIS — Z888 Allergy status to other drugs, medicaments and biological substances status: Secondary | ICD-10-CM

## 2019-08-09 DIAGNOSIS — E876 Hypokalemia: Secondary | ICD-10-CM | POA: Diagnosis present

## 2019-08-09 DIAGNOSIS — N390 Urinary tract infection, site not specified: Secondary | ICD-10-CM

## 2019-08-09 DIAGNOSIS — Z4659 Encounter for fitting and adjustment of other gastrointestinal appliance and device: Secondary | ICD-10-CM

## 2019-08-09 DIAGNOSIS — Z8673 Personal history of transient ischemic attack (TIA), and cerebral infarction without residual deficits: Secondary | ICD-10-CM

## 2019-08-09 DIAGNOSIS — R570 Cardiogenic shock: Secondary | ICD-10-CM | POA: Diagnosis not present

## 2019-08-09 DIAGNOSIS — K7210 Chronic hepatic failure without coma: Secondary | ICD-10-CM

## 2019-08-09 DIAGNOSIS — I129 Hypertensive chronic kidney disease with stage 1 through stage 4 chronic kidney disease, or unspecified chronic kidney disease: Secondary | ICD-10-CM | POA: Diagnosis present

## 2019-08-09 DIAGNOSIS — K652 Spontaneous bacterial peritonitis: Secondary | ICD-10-CM | POA: Diagnosis not present

## 2019-08-09 DIAGNOSIS — G9340 Encephalopathy, unspecified: Secondary | ICD-10-CM | POA: Diagnosis not present

## 2019-08-09 DIAGNOSIS — E860 Dehydration: Secondary | ICD-10-CM | POA: Diagnosis present

## 2019-08-09 DIAGNOSIS — Z515 Encounter for palliative care: Secondary | ICD-10-CM | POA: Diagnosis not present

## 2019-08-09 DIAGNOSIS — M329 Systemic lupus erythematosus, unspecified: Secondary | ICD-10-CM | POA: Diagnosis present

## 2019-08-09 DIAGNOSIS — K7200 Acute and subacute hepatic failure without coma: Secondary | ICD-10-CM

## 2019-08-09 DIAGNOSIS — K709 Alcoholic liver disease, unspecified: Secondary | ICD-10-CM

## 2019-08-09 DIAGNOSIS — K7011 Alcoholic hepatitis with ascites: Secondary | ICD-10-CM | POA: Diagnosis present

## 2019-08-09 DIAGNOSIS — E872 Acidosis, unspecified: Secondary | ICD-10-CM

## 2019-08-09 DIAGNOSIS — A4151 Sepsis due to Escherichia coli [E. coli]: Principal | ICD-10-CM | POA: Diagnosis present

## 2019-08-09 DIAGNOSIS — Z7982 Long term (current) use of aspirin: Secondary | ICD-10-CM

## 2019-08-09 DIAGNOSIS — F419 Anxiety disorder, unspecified: Secondary | ICD-10-CM | POA: Diagnosis present

## 2019-08-09 DIAGNOSIS — D696 Thrombocytopenia, unspecified: Secondary | ICD-10-CM | POA: Diagnosis present

## 2019-08-09 DIAGNOSIS — R1084 Generalized abdominal pain: Secondary | ICD-10-CM | POA: Diagnosis not present

## 2019-08-09 DIAGNOSIS — K922 Gastrointestinal hemorrhage, unspecified: Secondary | ICD-10-CM | POA: Diagnosis not present

## 2019-08-09 DIAGNOSIS — R627 Adult failure to thrive: Secondary | ICD-10-CM | POA: Diagnosis not present

## 2019-08-09 DIAGNOSIS — E039 Hypothyroidism, unspecified: Secondary | ICD-10-CM | POA: Diagnosis present

## 2019-08-09 DIAGNOSIS — K920 Hematemesis: Secondary | ICD-10-CM | POA: Diagnosis present

## 2019-08-09 DIAGNOSIS — R578 Other shock: Secondary | ICD-10-CM | POA: Diagnosis not present

## 2019-08-09 DIAGNOSIS — K7040 Alcoholic hepatic failure without coma: Secondary | ICD-10-CM | POA: Diagnosis not present

## 2019-08-09 DIAGNOSIS — R6521 Severe sepsis with septic shock: Secondary | ICD-10-CM | POA: Diagnosis not present

## 2019-08-09 DIAGNOSIS — G92 Toxic encephalopathy: Secondary | ICD-10-CM | POA: Diagnosis not present

## 2019-08-09 DIAGNOSIS — F329 Major depressive disorder, single episode, unspecified: Secondary | ICD-10-CM | POA: Diagnosis present

## 2019-08-09 DIAGNOSIS — N10 Acute tubulo-interstitial nephritis: Secondary | ICD-10-CM | POA: Diagnosis present

## 2019-08-09 DIAGNOSIS — J9601 Acute respiratory failure with hypoxia: Secondary | ICD-10-CM

## 2019-08-09 DIAGNOSIS — R140 Abdominal distension (gaseous): Secondary | ICD-10-CM

## 2019-08-09 DIAGNOSIS — F10229 Alcohol dependence with intoxication, unspecified: Secondary | ICD-10-CM | POA: Diagnosis present

## 2019-08-09 DIAGNOSIS — Z79890 Hormone replacement therapy (postmenopausal): Secondary | ICD-10-CM

## 2019-08-09 DIAGNOSIS — Z833 Family history of diabetes mellitus: Secondary | ICD-10-CM

## 2019-08-09 DIAGNOSIS — Z820 Family history of epilepsy and other diseases of the nervous system: Secondary | ICD-10-CM

## 2019-08-09 DIAGNOSIS — J9602 Acute respiratory failure with hypercapnia: Secondary | ICD-10-CM | POA: Diagnosis not present

## 2019-08-09 DIAGNOSIS — Z91030 Bee allergy status: Secondary | ICD-10-CM

## 2019-08-09 DIAGNOSIS — K7031 Alcoholic cirrhosis of liver with ascites: Secondary | ICD-10-CM | POA: Diagnosis present

## 2019-08-09 DIAGNOSIS — R0600 Dyspnea, unspecified: Secondary | ICD-10-CM

## 2019-08-09 DIAGNOSIS — Z79899 Other long term (current) drug therapy: Secondary | ICD-10-CM

## 2019-08-09 DIAGNOSIS — N182 Chronic kidney disease, stage 2 (mild): Secondary | ICD-10-CM | POA: Diagnosis present

## 2019-08-09 HISTORY — DX: Systemic involvement of connective tissue, unspecified: M35.9

## 2019-08-09 LAB — COMPREHENSIVE METABOLIC PANEL
ALT: 76 U/L — ABNORMAL HIGH (ref 0–44)
AST: 314 U/L — ABNORMAL HIGH (ref 15–41)
Albumin: 3.8 g/dL (ref 3.5–5.0)
Alkaline Phosphatase: 117 U/L (ref 38–126)
Anion gap: 18 — ABNORMAL HIGH (ref 5–15)
BUN: 25 mg/dL — ABNORMAL HIGH (ref 6–20)
CO2: 26 mmol/L (ref 22–32)
Calcium: 8 mg/dL — ABNORMAL LOW (ref 8.9–10.3)
Chloride: 80 mmol/L — ABNORMAL LOW (ref 98–111)
Creatinine, Ser: 0.98 mg/dL (ref 0.44–1.00)
GFR calc Af Amer: >60 mL/min (ref >60)
GFR calc non Af Amer: >60 mL/min (ref >60)
Glucose, Bld: 97 mg/dL (ref 70–99)
Potassium: 3.1 mmol/L — ABNORMAL LOW (ref 3.5–5.1)
Sodium: 124 mmol/L — ABNORMAL LOW (ref 135–145)
Total Bilirubin: 11.7 mg/dL — ABNORMAL HIGH (ref 0.3–1.2)
Total Protein: 7.7 g/dL (ref 6.5–8.1)

## 2019-08-09 LAB — CBC
HCT: 30.1 % — ABNORMAL LOW (ref 36.0–46.0)
HCT: 30.2 % — ABNORMAL LOW (ref 36.0–46.0)
Hemoglobin: 11 g/dL — ABNORMAL LOW (ref 12.0–15.0)
Hemoglobin: 10.9 g/dL — ABNORMAL LOW (ref 12.0–15.0)
MCH: 34.6 pg — ABNORMAL HIGH (ref 26.0–34.0)
MCH: 34.1 pg — ABNORMAL HIGH (ref 26.0–34.0)
MCHC: 36.5 g/dL — ABNORMAL HIGH (ref 30.0–36.0)
MCHC: 36.1 g/dL — ABNORMAL HIGH (ref 30.0–36.0)
MCV: 94.7 fL (ref 80.0–100.0)
MCV: 94.4 fL (ref 80.0–100.0)
Platelets: 31 10*3/uL — ABNORMAL LOW (ref 150–400)
Platelets: 27 10*3/uL — CL (ref 150–400)
RBC: 3.18 MIL/uL — ABNORMAL LOW (ref 3.87–5.11)
RBC: 3.2 MIL/uL — ABNORMAL LOW (ref 3.87–5.11)
RDW: 13.1 % (ref 11.5–15.5)
RDW: 13.2 % (ref 11.5–15.5)
WBC: 8 10*3/uL (ref 4.0–10.5)
WBC: 3.5 10*3/uL — ABNORMAL LOW (ref 4.0–10.5)
nRBC: 0 % (ref 0.0–0.2)
nRBC: 0 % (ref 0.0–0.2)

## 2019-08-09 LAB — URINALYSIS, COMPLETE (UACMP) WITH MICROSCOPIC
Glucose, UA: NEGATIVE mg/dL
Ketones, ur: NEGATIVE mg/dL
Nitrite: NEGATIVE
Protein, ur: 100 mg/dL — AB
Specific Gravity, Urine: 1.015 (ref 1.005–1.030)
WBC, UA: >50 WBC/hpf — ABNORMAL HIGH (ref 0–5)
pH: 5 (ref 5.0–8.0)

## 2019-08-09 LAB — PROTIME-INR
INR: 1.8 — ABNORMAL HIGH (ref 0.8–1.2)
Prothrombin Time: 19.8 seconds — ABNORMAL HIGH (ref 11.4–15.2)

## 2019-08-09 LAB — BILIRUBIN, DIRECT: Bilirubin, Direct: 6.5 mg/dL — ABNORMAL HIGH (ref 0.0–0.2)

## 2019-08-09 LAB — SARS CORONAVIRUS 2 BY RT PCR (HOSPITAL ORDER, PERFORMED IN ~~LOC~~ HOSPITAL LAB): SARS Coronavirus 2: NEGATIVE

## 2019-08-09 LAB — PHOSPHORUS: Phosphorus: 2.6 mg/dL (ref 2.5–4.6)

## 2019-08-09 LAB — MAGNESIUM: Magnesium: 0.9 mg/dL — CL (ref 1.7–2.4)

## 2019-08-09 LAB — AMMONIA: Ammonia: 31 umol/L (ref 9–35)

## 2019-08-09 LAB — ETHANOL: Alcohol, Ethyl (B): <10 mg/dL (ref <10)

## 2019-08-09 MED ORDER — PANTOPRAZOLE SODIUM 40 MG IV SOLR
80.0000 mg | Freq: Once | INTRAVENOUS | Status: AC
  Administered 2019-08-09: 19:00:00 80 mg via INTRAVENOUS
  Filled 2019-08-09: qty 80

## 2019-08-09 MED ORDER — OCTREOTIDE LOAD VIA INFUSION
50.0000 ug | Freq: Once | INTRAVENOUS | Status: AC
  Administered 2019-08-09: 50 ug via INTRAVENOUS
  Filled 2019-08-09: qty 25

## 2019-08-09 MED ORDER — LORAZEPAM 2 MG/ML IJ SOLN
1.0000 mg | INTRAMUSCULAR | Status: AC | PRN
  Administered 2019-08-09: 3 mg via INTRAVENOUS
  Administered 2019-08-10 – 2019-08-11 (×2): 1 mg via INTRAVENOUS
  Administered 2019-08-12 (×2): 2 mg via INTRAVENOUS
  Administered 2019-08-12: 1 mg via INTRAVENOUS
  Filled 2019-08-09: qty 2
  Filled 2019-08-09 (×5): qty 1

## 2019-08-09 MED ORDER — MAGNESIUM SULFATE 2 GM/50ML IV SOLN
2.0000 g | Freq: Once | INTRAVENOUS | Status: AC
  Administered 2019-08-09: 2 g via INTRAVENOUS
  Filled 2019-08-09: qty 50

## 2019-08-09 MED ORDER — SODIUM CHLORIDE 0.9 % IV SOLN: INTRAVENOUS | Status: DC

## 2019-08-09 MED ORDER — IPRATROPIUM BROMIDE 0.02 % IN SOLN: 0.5000 mg | Freq: Four times a day (QID) | RESPIRATORY_TRACT | Status: DC | PRN

## 2019-08-09 MED ORDER — POTASSIUM CHLORIDE 10 MEQ/100ML IV SOLN
10.0000 meq | INTRAVENOUS | Status: AC
  Administered 2019-08-09 – 2019-08-10 (×3): 10 meq via INTRAVENOUS
  Filled 2019-08-09 (×3): qty 100

## 2019-08-09 MED ORDER — SODIUM CHLORIDE 0.9 % IV BOLUS
1000.0000 mL | Freq: Once | INTRAVENOUS | Status: AC
  Administered 2019-08-09: 1000 mL via INTRAVENOUS

## 2019-08-09 MED ORDER — THIAMINE HCL 100 MG PO TABS
100.0000 mg | ORAL_TABLET | Freq: Every day | ORAL | Status: DC
  Administered 2019-08-10 – 2019-08-12 (×3): 100 mg via ORAL
  Filled 2019-08-09 (×4): qty 1

## 2019-08-09 MED ORDER — THIAMINE HCL 100 MG/ML IJ SOLN
100.0000 mg | Freq: Every day | INTRAMUSCULAR | Status: DC
  Administered 2019-08-09 – 2019-08-16 (×4): 100 mg via INTRAVENOUS
  Filled 2019-08-09 (×3): qty 2

## 2019-08-09 MED ORDER — SODIUM CHLORIDE 0.9 % IV SOLN
1.0000 g | INTRAVENOUS | Status: DC
  Administered 2019-08-10: 1 g via INTRAVENOUS
  Filled 2019-08-09 (×2): qty 10

## 2019-08-09 MED ORDER — CEFTRIAXONE SODIUM 1 G IJ SOLR
1.0000 g | Freq: Once | INTRAMUSCULAR | Status: AC
  Administered 2019-08-09: 1 g via INTRAVENOUS
  Filled 2019-08-09: qty 10

## 2019-08-09 MED ORDER — ALBUTEROL SULFATE (2.5 MG/3ML) 0.083% IN NEBU: 2.5000 mg | INHALATION_SOLUTION | Freq: Four times a day (QID) | RESPIRATORY_TRACT | Status: DC | PRN

## 2019-08-09 MED ORDER — SODIUM CHLORIDE 0.9 % IV SOLN
8.0000 mg/h | INTRAVENOUS | Status: AC
  Administered 2019-08-10 – 2019-08-12 (×5): 8 mg/h via INTRAVENOUS
  Filled 2019-08-09 (×7): qty 80

## 2019-08-09 MED ORDER — SODIUM CHLORIDE 0.9 % IV BOLUS
500.0000 mL | Freq: Once | INTRAVENOUS | Status: AC
  Administered 2019-08-09: 500 mL via INTRAVENOUS

## 2019-08-09 MED ORDER — ADULT MULTIVITAMIN W/MINERALS CH
1.0000 | ORAL_TABLET | Freq: Every day | ORAL | Status: DC
  Administered 2019-08-09 – 2019-08-12 (×4): 1 via ORAL
  Filled 2019-08-09 (×4): qty 1

## 2019-08-09 MED ORDER — FOLIC ACID 1 MG PO TABS
1.0000 mg | ORAL_TABLET | Freq: Every day | ORAL | Status: DC
  Administered 2019-08-09 – 2019-08-14 (×5): 1 mg via ORAL
  Filled 2019-08-09 (×6): qty 1

## 2019-08-09 MED ORDER — ONDANSETRON HCL 4 MG/2ML IJ SOLN
4.0000 mg | Freq: Four times a day (QID) | INTRAMUSCULAR | Status: DC | PRN
  Administered 2019-08-10 – 2019-08-19 (×13): 4 mg via INTRAVENOUS
  Filled 2019-08-09 (×13): qty 2

## 2019-08-09 MED ORDER — ONDANSETRON HCL 4 MG PO TABS: 4.0000 mg | ORAL_TABLET | Freq: Four times a day (QID) | ORAL | Status: DC | PRN

## 2019-08-09 MED ORDER — LORAZEPAM 1 MG PO TABS: 1.0000 mg | ORAL_TABLET | ORAL | Status: AC | PRN

## 2019-08-09 MED ORDER — SODIUM CHLORIDE 0.9 % IV SOLN
50.0000 ug/h | INTRAVENOUS | Status: AC
  Administered 2019-08-09 – 2019-08-12 (×5): 50 ug/h via INTRAVENOUS
  Filled 2019-08-09 (×10): qty 1

## 2019-08-09 NOTE — ED Notes (Signed)
EDP at bedside  

## 2019-08-09 NOTE — ED Notes (Signed)
Pts father leaving bedside to go home. Contact information:  [place] [phone]

## 2019-08-09 NOTE — ED Notes (Signed)
[facility] called for potential transfer spoke with Quita Skye at the transfer center

## 2019-08-09 NOTE — ED Notes (Signed)
Seizure pads in place  PT resting quietly

## 2019-08-09 NOTE — ED Provider Notes (Signed)
[facility] Emergency Department Provider Note  Time seen: 4:36 PM  I have reviewed the triage vital signs and the nursing notes.   HISTORY  Chief Complaint Altered Mental Status   HPI Dayja Hinote is a 44 y.o. female with a past medical history of alcohol abuse, depression, hypertension, lupus, presents to the emergency department for intoxication.  According to the patient she admits to heavy alcohol use, states it has been much less frequent since February when she was admitted to [facility] with liver issues.  However she does admit to continuing to drink on occasion including this morning.  Patient admits to getting intoxicated.  Per EMS patient was at her father's house, intoxicated, the father states he called EMS to get the patient help, which sounds more like he is hoping for help for the alcoholism.  Here the patient states that she decided to come to the hospital because her father think she really needs help.  Here the patient does admit to being intoxicated, she is alert and oriented x4 however.  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  Past Medical History:  Diagnosis Date  . Alcohol use disorder   . Alcohol withdrawal delirium ([place])   . Anxiety   . 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 Chronic kidney disease    rheumatologist follows her, lasix for this  . Depression   . Hypertension   . Lupus ([place])   . Raynaud's phenomenon   . Seizures ([place])   . Steatosis of liver     Patient Active Problem List   Diagnosis Date Noted  . Closed extra-articular fracture of distal tibia, right, initial encounter 12/27/2015  . Alcohol withdrawal delirium ([place]) 11/30/2015  . Alcohol withdrawal ([place]) 11/30/2015  . Adjustment disorder with mixed anxiety and depressed mood 11/27/2015  . Alcohol use disorder, severe, dependence ([place]) 11/26/2015  . Substance induced mood disorder ([place]) 11/26/2015  . Lupus ([place])  11/26/2015  . Seizures ([place]) 11/26/2015  . Elevated liver enzymes 06/22/2015  . Steatosis of liver 06/22/2015  . History of positive PPD 10/27/2011  . Hypertropia 01/16/2011  . Reactive airway disease 01/16/2011  . Reactive depression (situational) 01/16/2011  . Situational depression 01/16/2011    Past Surgical History:  Procedure Laterality Date  . BREAST ENHANCEMENT SURGERY    . ORIF TIBIA FRACTURE Right 12/28/2015   Procedure: OPEN REDUCTION INTERNAL FIXATION (ORIF) TIBIA FRACTURE;  Surgeon: Hessie Knows, MD;  Location: [facility] ORS;  Service: Orthopedics;  Laterality: Right;    Prior to Admission medications   Medication Sig Start Date End Date Taking? Authorizing Provider  aspirin EC 325 MG tablet Take 1 tablet (325 mg total) by mouth daily. 12/29/15   Reche Dixon, PA-C  DULoxetine (CYMBALTA) 30 MG capsule Take 1 capsule (30 mg total) by mouth daily. 12/03/15   Hower, Aaron Mose, MD  folic acid (FOLVITE) 1 MG tablet Take 1 tablet (1 mg total) by mouth daily. 12/03/15   Hower, Aaron Mose, MD  furosemide (LASIX) 40 MG tablet Take 1 tablet by mouth daily. 10/11/15   [provider]  gabapentin (NEURONTIN) 300 MG capsule Take 300 mg by mouth 3 (three) times daily.    [provider]  hydroxychloroquine (PLAQUENIL) 200 MG tablet Take 1 tablet (200 mg total) by mouth daily. 12/03/15   Hower, Aaron Mose, MD  levETIRAcetam (KEPPRA) 500 MG tablet Take 1 tablet (500 mg total) by mouth  2 (two) times daily. 12/02/15   Hower, Aaron Mose, MD  levothyroxine (SYNTHROID, LEVOTHROID) 25 MCG tablet Take 1 tablet (25 mcg total) by mouth daily before breakfast. 12/03/15   Hower, Aaron Mose, MD  LORazepam (ATIVAN) 0.5 MG tablet Take 1 tablet (0.5 mg total) by mouth every 8 (eight) hours as needed for anxiety or sleep. 12/31/15   Theodoro Grist, MD  mycophenolate (CELLCEPT) 250 MG capsule Take 250 mg by mouth 2 (two) times daily.    [provider]  oxyCODONE (OXY IR/ROXICODONE) 5 MG immediate release  tablet Take 1-2 tablets (5-10 mg total) by mouth every 3 (three) hours as needed for breakthrough pain. 12/29/15   Reche Dixon, PA-C  thiamine 100 MG tablet Take 1 tablet (100 mg total) by mouth daily. 12/03/15   Hower, Aaron Mose, MD    Allergies  Allergen Reactions  . Bee Venom     Shortness of breath  . Sumatriptan Anaphylaxis    Family History  Problem Relation Age of Onset  . Alzheimer's disease Maternal Grandfather   . Diabetes Paternal Grandmother     Social History Social History   Tobacco Use  . Smoking status: Never Smoker  . Smokeless tobacco: Never Used  Substance Use Topics  . Alcohol use: Yes    Comment: 2-3 times a week  . Drug use: No    Review of Systems Constitutional: Negative for fever. Cardiovascular: Negative for chest pain. Respiratory: Negative for shortness of breath. Gastrointestinal: Negative for abdominal pain.  States significant nausea and vomiting several weeks ago but none since. Genitourinary: Negative for urinary compaints Musculoskeletal: Negative for musculoskeletal complaints Neurological: Negative for headache All other ROS negative  ____________________________________________   PHYSICAL EXAM:  VITAL SIGNS: ED Triage Vitals  Enc Vitals Group     BP 08/09/19 1617 (!) 82/73     Pulse Rate 08/09/19 1617 (!) 106     Resp 08/09/19 1617 18     Temp 08/09/19 1617 99.8 F (37.7 C)     Temp Source 08/09/19 1617 Oral     SpO2 08/09/19 1617 97 %     Weight 08/09/19 1618 126 lb 15.8 oz (57.6 kg)     Height 08/09/19 1618 5\' 3"  (1.6 m)     Head Circumference --      Peak Flow --      Pain Score 08/09/19 1618 6     Pain Loc --      Pain Edu? --      Excl. in [place]? --     Constitutional: Patient is awake and alert, no acute distress.  Does admit to intoxication. Eyes: Patient has scleral icterus. ENT      Head: Normocephalic and atraumatic.      Mouth/Throat: Mucous membranes are moist. Cardiovascular: Normal rate, regular rhythm.   Respiratory: Normal respiratory effort without tachypnea nor retractions. Breath sounds are clear Gastrointestinal: Soft and nontender.  Slight distention. Musculoskeletal: Nontender with normal range of motion in all extremities.  Patient does have older appearing bruising to the left tibia.  Older appearing bruising with slight deformity to right wrist/distal forearm although both of these areas are largely nontender per patient. Neurologic:  Normal speech and language. No gross focal neurologic deficits  Skin:  Skin is warm, dry.  Slight jaundice. Psychiatric: Mood and affect are normal.   ____________________________________________    EKG  EKG viewed and interpreted by myself shows a normal sinus rhythm at 98 bpm with a narrow QRS, normal axis, normal  intervals nonspecific ST changes.  ____________________________________________    RADIOLOGY  X-ray negative  ____________________________________________   INITIAL IMPRESSION / ASSESSMENT AND PLAN / ED COURSE  Pertinent labs & imaging results that were available during my care of the patient were reviewed by me and considered in my medical decision making (see chart for details).   Patient presents to the emergency department, for medical evaluation after her father insisted she get help.  Patient states she is open to consideration of detox if medically cleared.  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  Here the patient has signs of significant liver disease such as jaundice scleral icterus and slight abdominal distention although nontender exam.  We will check labs, IV hydrate given her hypotension upon arrival and continue to closely monitor.  Patient agreeable to plan.  Patient now states 3 days ago she was vomiting blood.  Has not vomited since per patient.  Patient's labs show significant liver dysfunction.  Calculated meld at 29.  I discussed with the patient her significant liver dysfunction spoke to [facility] hepatology.   Hepatology states no need to transfer to [facility] at this time.  I do however recommend she be admitted to the hospital here for evaluation by GI medicine and supportive care.  Patient also appears to have a fairly significant urinary tract infection on urinalysis.  We have sent a urine culture and started on Rocephin.  I will start octreotide and Protonix infusions.  We will admit to the hospital service.   Breeann Wickes was evaluated in Emergency Department on 08/09/2019 for the symptoms described in the history of present illness. She was evaluated in the context of the global COVID-19 pandemic, which necessitated consideration that the patient might be at risk for infection with the SARS-CoV-2 virus that causes COVID-19. Institutional protocols and algorithms that pertain to the evaluation of patients at risk for COVID-19 are in a state of rapid change based on information released by regulatory bodies including the CDC and federal and state organizations. These policies and algorithms were followed during the patient's care in the ED.  CRITICAL CARE Performed by: Harvest Dark   Total critical care time: 45 minutes  Critical care time was exclusive of separately billable procedures and treating other patients.  Critical care was necessary to treat or prevent imminent or life-threatening deterioration.  Critical care was time spent personally by me on the following activities: development of treatment plan with patient and/or surrogate as well as nursing, discussions with consultants, evaluation of patient's response to treatment, examination of patient, obtaining history from patient or surrogate, ordering and performing treatments and interventions, ordering and review of laboratory studies, ordering and review of radiographic studies, pulse oximetry and re-evaluation of patient's condition.   ____________________________________________   FINAL CLINICAL IMPRESSION(S) / ED DIAGNOSES  Alcohol  abuse Liver dysfunction Upper GI bleed   Harvest Dark, MD 08/09/19 [phone]

## 2019-08-09 NOTE — ED Triage Notes (Signed)
BIB EMS from home due to AMS. Found on floor by father. Pt intoxicated with hx of alcohol abuse. Sinus tach with EMS. Pt has bruising to multiple areas of body. Alert to self, place and time. States that her father wanted her to come and that she did not want to come.

## 2019-08-10 ENCOUNTER — Inpatient Hospital Stay: Payer: Medicare PPO

## 2019-08-10 ENCOUNTER — Encounter: Payer: Self-pay | Admitting: Family Medicine

## 2019-08-10 DIAGNOSIS — K709 Alcoholic liver disease, unspecified: Secondary | ICD-10-CM | POA: Insufficient documentation

## 2019-08-10 DIAGNOSIS — E871 Hypo-osmolality and hyponatremia: Secondary | ICD-10-CM

## 2019-08-10 DIAGNOSIS — N10 Acute tubulo-interstitial nephritis: Secondary | ICD-10-CM | POA: Insufficient documentation

## 2019-08-10 LAB — MAGNESIUM: Magnesium: 1.8 mg/dL (ref 1.7–2.4)

## 2019-08-10 LAB — CBC
HCT: 26.9 % — ABNORMAL LOW (ref 36.0–46.0)
HCT: 27.9 % — ABNORMAL LOW (ref 36.0–46.0)
HCT: 28.3 % — ABNORMAL LOW (ref 36.0–46.0)
HCT: 28.7 % — ABNORMAL LOW (ref 36.0–46.0)
Hemoglobin: 10 g/dL — ABNORMAL LOW (ref 12.0–15.0)
Hemoglobin: 10 g/dL — ABNORMAL LOW (ref 12.0–15.0)
Hemoglobin: 10.1 g/dL — ABNORMAL LOW (ref 12.0–15.0)
Hemoglobin: 9.6 g/dL — ABNORMAL LOW (ref 12.0–15.0)
MCH: 33.7 pg (ref 26.0–34.0)
MCH: 34 pg (ref 26.0–34.0)
MCH: 34.1 pg — ABNORMAL HIGH (ref 26.0–34.0)
MCH: 34.4 pg — ABNORMAL HIGH (ref 26.0–34.0)
MCHC: 35.2 g/dL (ref 30.0–36.0)
MCHC: 35.3 g/dL (ref 30.0–36.0)
MCHC: 35.7 g/dL (ref 30.0–36.0)
MCHC: 35.8 g/dL (ref 30.0–36.0)
MCV: 95.4 fL (ref 80.0–100.0)
MCV: 95.7 fL (ref 80.0–100.0)
MCV: 95.9 fL (ref 80.0–100.0)
MCV: 96.6 fL (ref 80.0–100.0)
Platelets: 23 10*3/uL — CL (ref 150–400)
Platelets: 25 10*3/uL — CL (ref 150–400)
Platelets: 26 10*3/uL — CL (ref 150–400)
Platelets: 30 10*3/uL — ABNORMAL LOW (ref 150–400)
RBC: 2.82 MIL/uL — ABNORMAL LOW (ref 3.87–5.11)
RBC: 2.91 MIL/uL — ABNORMAL LOW (ref 3.87–5.11)
RBC: 2.93 MIL/uL — ABNORMAL LOW (ref 3.87–5.11)
RBC: 3 MIL/uL — ABNORMAL LOW (ref 3.87–5.11)
RDW: 13.3 % (ref 11.5–15.5)
RDW: 13.8 % (ref 11.5–15.5)
RDW: 13.9 % (ref 11.5–15.5)
RDW: 14 % (ref 11.5–15.5)
WBC: 5 10*3/uL (ref 4.0–10.5)
WBC: 5.5 10*3/uL (ref 4.0–10.5)
WBC: 5.6 10*3/uL (ref 4.0–10.5)
WBC: 7.7 10*3/uL (ref 4.0–10.5)
nRBC: 0 % (ref 0.0–0.2)
nRBC: 0 % (ref 0.0–0.2)
nRBC: 0 % (ref 0.0–0.2)
nRBC: 0 % (ref 0.0–0.2)

## 2019-08-10 LAB — BASIC METABOLIC PANEL
Anion gap: 13 (ref 5–15)
BUN: 24 mg/dL — ABNORMAL HIGH (ref 6–20)
CO2: 22 mmol/L (ref 22–32)
Calcium: 7 mg/dL — ABNORMAL LOW (ref 8.9–10.3)
Chloride: 94 mmol/L — ABNORMAL LOW (ref 98–111)
Creatinine, Ser: 0.98 mg/dL (ref 0.44–1.00)
GFR calc Af Amer: >60 mL/min (ref >60)
GFR calc non Af Amer: >60 mL/min (ref >60)
Glucose, Bld: 76 mg/dL (ref 70–99)
Potassium: 2.8 mmol/L — ABNORMAL LOW (ref 3.5–5.1)
Sodium: 129 mmol/L — ABNORMAL LOW (ref 135–145)

## 2019-08-10 LAB — PROTIME-INR
INR: 1.8 — ABNORMAL HIGH (ref 0.8–1.2)
Prothrombin Time: 19.9 seconds — ABNORMAL HIGH (ref 11.4–15.2)

## 2019-08-10 LAB — LACTIC ACID, PLASMA
Lactic Acid, Venous: 1.2 mmol/L (ref 0.5–1.9)
Lactic Acid, Venous: 1.1 mmol/L (ref 0.5–1.9)

## 2019-08-10 LAB — PROCALCITONIN: Procalcitonin: 4.18 ng/mL

## 2019-08-10 LAB — VITAMIN B12: Vitamin B-12: 1011 pg/mL — ABNORMAL HIGH (ref 180–914)

## 2019-08-10 LAB — HIV ANTIBODY (ROUTINE TESTING W REFLEX): HIV Screen 4th Generation wRfx: NONREACTIVE

## 2019-08-10 LAB — FOLATE: Folate: 24 ng/mL (ref >5.9)

## 2019-08-10 MED ORDER — IOHEXOL 300 MG/ML  SOLN
100.0000 mL | Freq: Once | INTRAMUSCULAR | Status: AC | PRN
  Administered 2019-08-10: 100 mL via INTRAVENOUS

## 2019-08-10 MED ORDER — VITAMIN K1 10 MG/ML IJ SOLN
10.0000 mg | Freq: Once | INTRAMUSCULAR | Status: AC
  Administered 2019-08-10: 10 mg via SUBCUTANEOUS
  Filled 2019-08-10: qty 1

## 2019-08-10 MED ORDER — POTASSIUM CHLORIDE CRYS ER 20 MEQ PO TBCR
20.0000 meq | EXTENDED_RELEASE_TABLET | ORAL | Status: AC
  Administered 2019-08-10 (×3): 20 meq via ORAL
  Filled 2019-08-10 (×2): qty 1

## 2019-08-10 NOTE — Consult Note (Signed)
GI Inpatient Consult Note  Reason for Consult: Alcoholic hepatitis, alcoholic cirrhosis, hematemesis   Attending Requesting Consult: Dr. Fritzi Mandes  History of Present Illness: Chloe [name] is a 44 y.o. female seen for evaluation of alcoholic hepatitis, alcoholic cirrhosis, and hematemesis at the request of Dr. Fritzi Mandes. Pt has a PMH of alcoholic cirrhosis of the liver with known history of esophageal and gastric varices, SLE, Hx of CVA, HTN, Hx of seizures, anxiety and depression, CKD. Her father is present during examination. Per father, he found patient lying down on the floor intoxicated yesterday morning and called EMS in hopes of getting his daughter some help. Per patient and father, she was hospitalized at [facility] in February 1962 for alcoholic hepatitis and reportedly got steroid treatment. She reports she has been dealing with alcohol use disorder most of her adult life. She reports she goes through periods where she doesn't drink for months and then goes through periods of drinking 12 shots of vodka or bottle of wine nightly. She reports she is not currently working but was a former Marine scientist in pediatrics and cardiology. She reports three days ago she was having several episodes of hematemesis with bright red blood. She counts 37 times she vomited about three weeks ago but didn't see any coffee-ground emesis or bright red blood. She has not had any vomiting episodes in over 48 hours. Patient reports she is ashamed and embarrassed of her current situation and would like to change for the better. She denies any history of upper GI bleeding. She denies any altered mental status, pruritus, abdominal pain, dysphagia, odynophagia, or unintentional weight loss. She denies any hematochezia or melena. She is currently on octreotide and Protonix infusions. Patient lives by herself. She does not following with a hepatologist or general GI physician.    Past Medical History:  Past Medical History:  Diagnosis  Date  . Alcohol use disorder   . Alcohol withdrawal delirium ([place])   . Anxiety   . 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 Chronic kidney disease    rheumatologist follows her, lasix for this  . Depression   . Hypertension   . Lupus ([place])   . Raynaud's phenomenon   . Seizures ([place])   . Steatosis of liver     Problem List: Patient Active Problem List   Diagnosis Date Noted  . Alcohol induced liver disorder ([place])   . Acute pyelonephritis   . Hyponatremia   . Upper GI bleed 08/09/2019  . Closed extra-articular fracture of distal tibia, right, initial encounter 12/27/2015  . Alcohol withdrawal delirium ([place]) 11/30/2015  . Alcohol withdrawal ([place]) 11/30/2015  . Adjustment disorder with mixed anxiety and depressed mood 11/27/2015  . Alcohol use disorder, severe, dependence ([place]) 11/26/2015  . Substance induced mood disorder ([place]) 11/26/2015  . Lupus ([place]) 11/26/2015  . Seizures ([place]) 11/26/2015  . Elevated liver enzymes 06/22/2015  . Steatosis of liver 06/22/2015  . History of positive PPD 10/27/2011  . Hypertropia 01/16/2011  . Reactive airway disease 01/16/2011  . Reactive depression (situational) 01/16/2011  . Situational depression 01/16/2011    Past Surgical History: Past Surgical History:  Procedure Laterality Date  . BREAST ENHANCEMENT SURGERY    . ORIF TIBIA FRACTURE Right 12/28/2015   Procedure: OPEN REDUCTION INTERNAL FIXATION (ORIF) TIBIA FRACTURE;  Surgeon: Hessie Knows, MD;  Location: [facility] ORS;  Service: Orthopedics;  Laterality: Right;    Allergies: Allergies  Allergen Reactions  . Bee Venom     Shortness  of breath  . Sumatriptan Anaphylaxis    Home Medications: (Not in a hospital admission)  Home medication reconciliation was completed with the patient.   Scheduled Inpatient Medications:   . folic acid  1 mg Oral Daily  . multivitamin with minerals  1 tablet Oral Daily  . phytonadione  10 mg Subcutaneous Once  . potassium chloride  20 mEq Oral  Q4H  . thiamine  100 mg Oral Daily   Or  . thiamine  100 mg Intravenous Daily    Continuous Inpatient Infusions:   . sodium chloride 50 mL/hr at 08/10/19 0755  . cefTRIAXone (ROCEPHIN)  IV 1 g (08/10/19 1819)  . octreotide  (SANDOSTATIN)    IV infusion 50 mcg/hr (08/10/19 1410)  . pantoprozole (PROTONIX) infusion 8 mg/hr (08/10/19 1115)    PRN Inpatient Medications:  albuterol, ipratropium, LORazepam **OR** LORazepam, ondansetron **OR** ondansetron (ZOFRAN) IV  Family History: family history includes Alzheimer's disease in her maternal grandfather; Diabetes in her paternal grandmother.  The patient's family history is negative for inflammatory bowel disorders, GI malignancy, or solid organ transplantation.  Social History:   reports that she has never smoked. She has never used smokeless tobacco. She reports current alcohol use. She reports that she does not use drugs. The patient denies ETOH, tobacco, or drug use.   Review of Systems: Constitutional: Weight is stable.  Eyes: No changes in vision. ENT: No oral lesions, sore throat.  GI: see HPI.  Heme/Lymph: No easy bruising.  CV: No chest pain.  GU: No hematuria.  Integumentary: No rashes.  Neuro: No headaches.  Psych: No depression/anxiety.  Endocrine: No heat/cold intolerance.  Allergic/Immunologic: No urticaria.  Resp: No cough, SOB.  Musculoskeletal: No joint swelling.    Physical Examination: BP 114/80 (BP Location: Right Arm)   Pulse 89   Temp 99.3 F (37.4 C) (Oral)   Resp 19   Ht 5\' 3"  (1.6 m)   Wt 57.6 kg   SpO2 100%   BMI 22.49 kg/m  Gen: NAD, alert and oriented x 4 HEENT: PEERLA, EOMI, +scleral icterus Neck: supple, no JVD or thyromegaly Chest: CTA bilaterally, no wheezes, crackles, or other adventitious sounds CV: RRR, no m/g/c/r Abd: soft, NT, ND, +BS in all four quadrants; no HSM, guarding, ridigity, or rebound tenderness Ext: no edema, well perfused with 2+ pulses, Skin: no rash or lesions  noted Lymph: no LAD  Data: Lab Results  Component Value Date   WBC 5.6 08/10/2019   HGB 9.6 (L) 08/10/2019   HCT 26.9 (L) 08/10/2019   MCV 95.4 08/10/2019   PLT 26 (LL) 08/10/2019   Recent Labs  Lab 08/10/19 0016 08/10/19 0555 08/10/19 1223  HGB 10.1* 10.0* 9.6*   Lab Results  Component Value Date   NA 129 (L) 08/10/2019   K 2.8 (L) 08/10/2019   CL 94 (L) 08/10/2019   CO2 22 08/10/2019   BUN 24 (H) 08/10/2019   CREATININE 0.98 08/10/2019   Lab Results  Component Value Date   ALT 76 (H) 08/09/2019   AST 314 (H) 08/09/2019   ALKPHOS 117 08/09/2019   BILITOT 11.7 (H) 08/09/2019   Recent Labs  Lab 08/10/19 0612  INR 1.8*   Assessment:  44 y/o Caucasian female with a PMH of alcoholic cirrhosis of the liver with known history of esophageal and gastric varices, SLE, Hx of CVA, HTN, Hx of seizures, anxiety and depression, and CKD who presented to the [facility] ED via EMS after being found lying on the floor  intoxicated  1. Alcoholic cirrhosis with ascites - MELD 22, Child's Class B (9 pts)  2. Alcoholic hepatitis - Maddrey DF 34  3. Coagulopathic - INR 1.8, platelets 26K  4. Hematemesis - concern for UGI bleed given known hx of esophageal varices, she is appropriately on IV Protonix and octreotide, hemodynamically stable with no evidence of active GI bleeding  5. Pyelonephritis - on IV antibiotics, primary team following  6. Transaminitis - AST 314, ALT 76 suggests ongoing alcohol abuse  COVID-19 Test - NEGATIVE  Recommendations:  1. Concern for UGI bleed given her report of multiple episodes of hematemesis and history of alcoholic cirrhosis with esophageal/gastric varices 2. Continue IV Protonix and IV octreotide infusions 3. Hemodynamically stable with no active GI bleeding. Continue to monitor H&H closely and transfuse for Hgb <7.0. 4. Due to coagulopathy, will administer Vitamin K 10 mg SQ 5. Advise EGD for further evaluation of upper GI tract to rule out  variceal bleeding, PUD, gastritis, AVM, PHG, GAVE, or other sources of bleeding. We discussed procedure details and indications today. She consents to proceed. 6. Maddrey DF >32 suggests she would benefit with treatment. We will out out infection with hepatitis serologies and plan on starting after EGD 7. EGD tomorrow with Dr. Alice Reichert. NPO after midnight 8. Discussed upmost importance of alcohol cessation 9. CIWA protocol with banana bag 10. Agree with social work consult for discussion about rehab options as patient would benefit from structured program given multiple hospitilzations 11. Following along with you   I reviewed the risks (including bleeding, perforation, infection, anesthesia complications, cardiac/respiratory complications), benefits and alternatives of EGD. Patient consents to proceed.    Thank you for the consult. Please call with questions or concerns.  Reeves Forth [facility] [phone] (Cell)

## 2019-08-10 NOTE — ED Notes (Signed)
Pt awake and alert. Pt answering questions with appropriate answers. Pt unaware of what happened and why she is in hospital. Pt updated and encouraged to urinate.

## 2019-08-10 NOTE — TOC Initial Note (Signed)
Transition of Care [facility]) - Initial/Assessment Note    Patient Details  Name: Chloe [name] MRN: [id number] Date of Birth: 1975-04-11  Transition of Care [facility]) CM/SW Contact:    Anselm Pancoast, RN Phone Number: 08/10/2019, 1:15 PM  Clinical Narrative:                 Spoke to patients father whose main concern is getting patient treatment for ETOH abuse. Patient lives alone and is on disability for unknown reason. Patient has a drivers license however drives very little due to impaired vision. Patient has a walker that she uses as needed. Patient is sleeping and waiting for room upstairs.   Expected Discharge Plan: Home/Self Care Barriers to Discharge: Continued Medical Work up   Patient Goals and CMS Choice Patient states their goals for this hospitalization and ongoing recovery are:: Get home-family requesting help with ETOH abuse      Expected Discharge Plan and Services Expected Discharge Plan: Home/Self Care       Living arrangements for the past 2 months: Single Family Home                                      Prior Living Arrangements/Services Living arrangements for the past 2 months: Single Family Home Lives with:: Self Patient language and need for interpreter reviewed:: Yes Do you feel safe going back to the place where you live?: Yes      Need for Family Participation in Patient Care: Yes (Comment) Care giver support system in place?: Yes (comment) Current home services: DME(walker) Criminal Activity/Legal Involvement Pertinent to Current Situation/Hospitalization: No - Comment as needed  Activities of Daily Living      Permission Sought/Granted Permission sought to share information with : Case Manager Permission granted to share information with : Yes, Verbal Permission Granted  Share Information with NAME: TOC Department           Emotional Assessment Appearance:: Appears older than stated age Attitude/Demeanor/Rapport: Unable to  Assess Affect (typically observed): Unable to Assess Orientation: : Oriented to Self, Oriented to Place, Oriented to  Time, Oriented to Situation Alcohol / Substance Use: Alcohol Use(active ETOH abuse) Psych Involvement: No (comment)  Admission diagnosis:  Upper GI bleed [K92.2] Patient Active Problem List   Diagnosis Date Noted  . Upper GI bleed 08/09/2019  . Closed extra-articular fracture of distal tibia, right, initial encounter 12/27/2015  . Alcohol withdrawal delirium ([place]) 11/30/2015  . Alcohol withdrawal ([place]) 11/30/2015  . Adjustment disorder with mixed anxiety and depressed mood 11/27/2015  . Alcohol use disorder, severe, dependence ([place]) 11/26/2015  . Substance induced mood disorder ([place]) 11/26/2015  . Lupus ([place]) 11/26/2015  . Seizures ([place]) 11/26/2015  . Elevated liver enzymes 06/22/2015  . Steatosis of liver 06/22/2015  . History of positive PPD 10/27/2011  . Hypertropia 01/16/2011  . Reactive airway disease 01/16/2011  . Reactive depression (situational) 01/16/2011  . Situational depression 01/16/2011   PCP:  [place], [place]:   [id number] [place], [place] [address] Phone: [phone] Fax: [phone]     Social Determinants of Health (SDOH) Interventions    Readmission Risk Interventions No flowsheet data found.

## 2019-08-10 NOTE — ED Notes (Signed)
MD DeLisle changed diet order. Pt provided with fluids/OJ and dietary called for pt's food order.

## 2019-08-10 NOTE — ED Notes (Signed)
Pt asking for food and fluids. Pt NPO status at this time. This Rn notified MD Colbert.

## 2019-08-10 NOTE — ED Notes (Signed)
Provider Posey Pronto at bedside.

## 2019-08-10 NOTE — ED Notes (Signed)
NP contacted due to MAP decrease and pts status.

## 2019-08-10 NOTE — ED Notes (Signed)
Bladder scan reveals >1L. NP at bedside.

## 2019-08-10 NOTE — ED Notes (Signed)
Urine output at 317ml. Pt taken to CT at this time.

## 2019-08-10 NOTE — Progress Notes (Signed)
[place] [facility] [place] NAME: Chloe [name]    MR#:  [id number]  DATE OF BIRTH:  02-13-1976  SUBJECTIVE:   Patient was brought in from home a by EMS after she was found fallen down on the floor by her father. Patient has history of chronic alcoholism with significant liver disorder. She reports drinking a glass of wine and a glass of vodka on a daily basis. She has been vomiting bright red blood mixed with coffee ground emesis few days ago. Hasn't vomited lately. Asking for food.  REVIEW OF SYSTEMS:   Review of Systems  Constitutional: Positive for malaise/fatigue. Negative for chills, fever and weight loss.  HENT: Negative for ear discharge, ear pain and nosebleeds.   Eyes: Negative for blurred vision, pain and discharge.  Respiratory: Negative for sputum production, shortness of breath, wheezing and stridor.   Cardiovascular: Negative for chest pain, palpitations, orthopnea and PND.  Gastrointestinal: Positive for vomiting. Negative for abdominal pain, diarrhea and nausea.  Genitourinary: Negative for frequency and urgency.  Musculoskeletal: Positive for falls. Negative for back pain and joint pain.  Neurological: Positive for weakness. Negative for sensory change, speech change and focal weakness.  Psychiatric/Behavioral: Negative for depression and hallucinations. The patient is not nervous/anxious.    Tolerating Diet: Tolerating PT:   DRUG ALLERGIES:   Allergies  Allergen Reactions   Bee Venom     Shortness of breath   Sumatriptan Anaphylaxis    VITALS:  Blood pressure 113/65, pulse (!) 101, temperature 99.3 F (37.4 C), temperature source Oral, resp. rate (!) 26, height 5\' 3"  (1.6 m), weight 57.6 kg, SpO2 99 %.  PHYSICAL EXAMINATION:   Physical Exam  GENERAL:  44 y.o.-year-old patient lying in the bed with no acute distress. Looks older than her stated age. Appears chronically ill. EYES: Pupils equal, round, reactive to  light and accommodation.   severe scleral icterus.   HEENT: Head atraumatic, normocephalic. Oropharynx and nasopharynx clear.  NECK:  Supple, no jugular venous distention. No thyroid enlargement, no tenderness.  LUNGS: Normal breath sounds bilaterally, no wheezing, rales, rhonchi. No use of accessory muscles of respiration.  CARDIOVASCULAR: S1, S2 normal. No murmurs, rubs, or gallops. Tachycardia ABDOMEN: Soft, nontender, nondistended. Bowel sounds present. No organomegaly or mass.  EXTREMITIES: No cyanosis, clubbing or edema b/l.    NEUROLOGIC: Cranial nerves II through XII are intact. No focal Motor or sensory deficits b/l. Generalized weakness PSYCHIATRIC:  patient is alert and oriented x 3.  SKIN: multiple bruises with the body large bruise on the right wrist.  LABORATORY PANEL:  CBC Recent Labs  Lab 08/10/19 1223  WBC 5.6  HGB 9.6*  HCT 26.9*  PLT 26*    Chemistries  Recent Labs  Lab 08/09/19 1624 08/09/19 2057 08/10/19 0555  NA 124*  --  129*  K 3.1*  --  2.8*  CL 80*  --  94*  CO2 26  --  22  GLUCOSE 97  --  76  BUN 25*  --  24*  CREATININE 0.98  --  0.98  CALCIUM 8.0*  --  7.0*  MG  --    < > 1.8  AST 314*  --   --   ALT 76*  --   --   ALKPHOS 117  --   --   BILITOT 11.7*  --   --    < > = values in this interval not displayed.   Cardiac Enzymes No results  for input(s): TROPONINI in the last 168 hours. RADIOLOGY:  DG Wrist Complete Right  Result Date: 08/09/2019 CLINICAL DATA:  Fall with pain and bruising. EXAM: RIGHT WRIST - COMPLETE 3+ VIEW COMPARISON:  None. FINDINGS: There is no evidence of fracture or dislocation. There is no evidence of arthropathy or other focal bone abnormality. Soft tissues are unremarkable. IMPRESSION: Negative. Electronically Signed   By: Nelson Chimes M.D.   On: 08/09/2019 17:08   CT ABDOMEN PELVIS W CONTRAST  Result Date: 08/10/2019 CLINICAL DATA:  Abdominal swelling, ascites suspected EXAM: CT ABDOMEN AND PELVIS WITH CONTRAST  TECHNIQUE: Multidetector CT imaging of the abdomen and pelvis was performed using the standard protocol following bolus administration of intravenous contrast. CONTRAST:  172mL OMNIPAQUE IOHEXOL 300 MG/ML  SOLN COMPARISON:  None FINDINGS: Lower chest: Atelectatic changes in the lung bases, right slightly greater than left. Included lungs are otherwise clear. Mild cardiomegaly with right atrial enlargement. Slight compression of the right heart by a pectus deformity of the chest (Haller index 3.7). Bilateral breast prostheses with minimal capsular calcification. Hepatobiliary: Diffuse hepatic hypoattenuation with some sparing along the gallbladder fossa which could reflect some diffuse fatty infiltration. A a markedly nodular hepatic surface contour is present as well with more global heterogeneity likely denoting numerous regenerative nodules and stigmata of cirrhosis. No focal concerning liver lesion is identified. There is moderate gallbladder distention with few dependently layering calcified gallstones and some mild gallbladder wall thickening, which is nonspecific in the setting of intrinsic liver disease. Pancreas: Unremarkable. No pancreatic ductal dilatation or surrounding inflammatory changes. Spleen: Borderline splenomegaly.  No concerning splenic lesions. Adrenals/Urinary Tract: Normal adrenal glands. Subcentimeter hypodense focus in the lower pole right kidney too small to fully characterize on CT imaging but statistically likely benign. There is asymmetric right perinephric stranding with a diffusely striated right nephrogram including more heterogeneous attenuation in the right lower pole. Features are much more conspicuous on the delayed phase imaging where slight striations of the left nephrogram is noted as well. There is mild bilateral hydroureteronephrosis to the level of the urinary bladder but without obstructing urolithiasis. Mild urothelial thickening noted as well. Few punctate nonobstructing  calculi are present in the upper pole left kidney and lower pole right kidney. There is moderate bladder distention but without gross bladder wall thickening. Stomach/Bowel: There is a small sliding-type hiatal hernia. Extensive paraesophageal venous collateralization and varix formation as well as additional varices and collaterals along the lesser curvature of the proximal stomach. Mild gastric wall thickening is nonspecific. Duodenum takes a normal course across the midline abdomen. Much of the small bowel is fluid-filled but nondistended. Slightly mobile cecum directed towards the left upper quadrant. Normal air-filled appendix without inflammation. Moderate to large colonic stool burden. No colonic dilatation or wall thickening. Vascular/Lymphatic: Paraesophageal and gastric varices. Additional upper abdominal collaterals including engorgement of the splenic vein. Recanalization of the umbilical vein. Portal and superior mesenteric veins are normally opacified. Hepatic veins normally opacified. No aortic aneurysm or ectasia. Some edematous central lymph nodes. No pathologically enlarged nodes in the abdomen or pelvis. Reproductive: Anteverted uterus.  No concerning adnexal lesions. Other: Small volume ascites predominantly in the subphrenic spaces some central edematous mesenteric changes as well. No bowel containing hernia. No free abdominopelvic air, organized collection or abscess. Musculoskeletal: Few ill-defined lucencies in the and ilia likely reflect marrow changes in the absence of known malignancy. No acute or worrisome osseous lesions. Multilevel degenerative changes are present in the imaged portions of the spine. No  acute osseous abnormality or suspicious osseous lesion. IMPRESSION: 1. Features of acute bilateral pyelonephritis, right more pronounced than left, with concomitant mild bilateral hydroureteronephrosis and urothelial thickening. Correlate with urinalysis. 2. Stigmata of cirrhosis with  sequela of portal hypertension including splenomegaly, extensive paraesophageal and gastric varices, recanalized umbilical vein and additional upper abdominal collaterals as well as small volume ascites and splenomegaly. 3. Cholelithiasis with mild gallbladder wall thickening with distension. Wall thickening is nonspecific in the setting of intrinsic liver disease however the distension may be the initial presentation of cholecystitis. If there is clinical concern, consider further evaluation with right upper quadrant ultrasound. 4. Moderate to large colonic stool burden. Correlate for features of constipation. 5. Few ill-defined lucencies ilia likely reflect marrow changes in the absence of known malignancy. Correlate with history. 6. Mild cardiomegaly with right atrial enlargement. 7. Slight compression of the right heart by a pectus deformity of the chest (Haller index 3.7). 8. Aortic Atherosclerosis (ICD10-I70.0). Electronically Signed   By: Lovena Le M.D.   On: 08/10/2019 05:54   ASSESSMENT AND PLAN:  Chloe [name] is a 43 y.o. female with a known history of alcohol use disorder with withdrawal syndrome, CKD, anxiety/depression, hypertension, lupus, seizures presents to the emergency department for evaluation of alcohol intoxication.  Last drink was this morning.  She was reportedly found on the floor intoxicated this morning by her father with bruising to multiple areas of her body.  Patient's father brought her to the emergency department today for help with her alcoholism.  Upon further questioning the patient reports that she did vomit blood 3 days ago but has not had any hematemesis since.  #.  Alcohol use disorder with intoxication, alcoholic liver disease MELD score of 29 with electrolyte derangement -GI consultation with Dr Alice Reichert --Patient does not follow with hepatologist at [facility]. -Correct electrolytes -Monitor LFTs and INR -Continue Lasix and spironolactone -CIWA protocol with banana  bag -Seizure and fall precautions -Social work consultation for discussion of detox and rehabilitation options  # Upper GI bleed-vitals improved with fluids, H&H stable -- patient likely has variceal bleed given history of alcoholic cirrhosis of liver. -IV Protonix  and octreotide drip -Serial CBCs -away G.I. consultation for further evaluation management -Hold anticoagulants  #Acquired coagulopathy secondary to liver cirrhosis -low platelet count, elevated PT/INR  #.  UTI/acute pyelonephritis as noted on CT abdomen -IV Rocephin -Follow-up urine cultures  #.  History of depression/anxiety -Continue Cymbalta  #.  History of hypothyroidism -Continue Synthroid   Diet/Nutrition: NPO DVT Px: SCDs and early ambulation. Chemoprophylaxis would be contraindicated at this point secondary to active GI bleeding. Code Status: Full.  Advanced directives were discussed at length with the patient who expresses understanding and agrees with the plan  Family communication : patient wants to wait another day before all the lab results and plan is made Consults : G.I. consultation   Status is: Inpatient  Remains inpatient appropriate because:IV treatments appropriate due to intensity of illness or inability to take PO and Inpatient level of care appropriate due to severity of illness   Dispo: The patient is from: Home              Anticipated d/c is to: Home              Anticipated d/c date is: > 3 days              Patient currently is not medically stable to d/c.  Severely ill with G.I. bleed, chronic alcoholism,  bilateral pyelonephritis. Await G.I. evaluation for further workup.     TOTAL TIME TAKING CARE OF THIS PATIENT: *35* minutes.  >50% time spent on counselling and coordination of care  Note: This dictation was prepared with Dragon dictation along with smaller phrase technology. Any transcriptional errors that result from this process are unintentional.  Fritzi Mandes M.D     [facility]   CC: Primary care physician; [place] ID: Sena Hoopingarner, female   DOB: December 14, 1975, 44 y.o.   MRN: [id number]

## 2019-08-10 NOTE — ED Notes (Signed)
Pt asking for food. Pt notified she is NPO. MD Posey Pronto notified.

## 2019-08-11 ENCOUNTER — Inpatient Hospital Stay: Payer: Medicare PPO | Admitting: Anesthesiology

## 2019-08-11 ENCOUNTER — Encounter
Admission: EM | Disposition: A | Discharge status: Hospice | Payer: Self-pay | Source: Home / Self Care | Attending: Student

## 2019-08-11 DIAGNOSIS — A419 Sepsis, unspecified organism: Secondary | ICD-10-CM

## 2019-08-11 LAB — HEPATITIS B CORE ANTIBODY, IGM: Hep B C IgM: NONREACTIVE

## 2019-08-11 LAB — URINE CULTURE: Culture: >=100000 — AB

## 2019-08-11 LAB — COMPREHENSIVE METABOLIC PANEL
ALT: 56 U/L — ABNORMAL HIGH (ref 0–44)
AST: 189 U/L — ABNORMAL HIGH (ref 15–41)
Albumin: 2.4 g/dL — ABNORMAL LOW (ref 3.5–5.0)
Alkaline Phosphatase: 111 U/L (ref 38–126)
Anion gap: 8 (ref 5–15)
BUN: 15 mg/dL (ref 6–20)
CO2: 22 mmol/L (ref 22–32)
Calcium: 7.6 mg/dL — ABNORMAL LOW (ref 8.9–10.3)
Chloride: 101 mmol/L (ref 98–111)
Creatinine, Ser: 0.71 mg/dL (ref 0.44–1.00)
GFR calc Af Amer: >60 mL/min (ref >60)
GFR calc non Af Amer: >60 mL/min (ref >60)
Glucose, Bld: 156 mg/dL — ABNORMAL HIGH (ref 70–99)
Potassium: 3.3 mmol/L — ABNORMAL LOW (ref 3.5–5.1)
Sodium: 131 mmol/L — ABNORMAL LOW (ref 135–145)
Total Bilirubin: 7.9 mg/dL — ABNORMAL HIGH (ref 0.3–1.2)
Total Protein: 5.8 g/dL — ABNORMAL LOW (ref 6.5–8.1)

## 2019-08-11 LAB — PREGNANCY, URINE: Preg Test, Ur: NEGATIVE

## 2019-08-11 LAB — HEPATITIS B SURFACE ANTIGEN: Hepatitis B Surface Ag: NONREACTIVE

## 2019-08-11 LAB — HEPATITIS C ANTIBODY: HCV Ab: NONREACTIVE

## 2019-08-11 LAB — OCCULT BLOOD X 1 CARD TO LAB, STOOL: Fecal Occult Bld: POSITIVE — AB

## 2019-08-11 LAB — MAGNESIUM: Magnesium: 1.3 mg/dL — ABNORMAL LOW (ref 1.7–2.4)

## 2019-08-11 LAB — PROCALCITONIN: Procalcitonin: 2.78 ng/mL

## 2019-08-11 SURGERY — EGD (ESOPHAGOGASTRODUODENOSCOPY)
Anesthesia: General

## 2019-08-11 MED ORDER — POTASSIUM CHLORIDE CRYS ER 20 MEQ PO TBCR: 40.0000 meq | EXTENDED_RELEASE_TABLET | Freq: Once | ORAL | Status: DC

## 2019-08-11 MED ORDER — SODIUM CHLORIDE 0.9 % IV SOLN
2.0000 g | INTRAVENOUS | Status: DC
  Administered 2019-08-11 – 2019-08-12 (×2): 2 g via INTRAVENOUS
  Filled 2019-08-11: qty 2
  Filled 2019-08-11: qty 20
  Filled 2019-08-11: qty 2

## 2019-08-11 MED ORDER — SODIUM CHLORIDE 0.9 % IV BOLUS
500.0000 mL | Freq: Once | INTRAVENOUS | Status: AC
  Administered 2019-08-11: 500 mL via INTRAVENOUS

## 2019-08-11 MED ORDER — MAGNESIUM SULFATE 4 GM/100ML IV SOLN
4.0000 g | Freq: Once | INTRAVENOUS | Status: DC
  Filled 2019-08-11 (×4): qty 100

## 2019-08-11 MED ORDER — ENSURE PRE-SURGERY PO LIQD
2.0000 | Freq: Once | ORAL | Status: AC
  Administered 2019-08-11: 592 mL via ORAL
  Filled 2019-08-11: qty 592

## 2019-08-11 MED ORDER — POTASSIUM CHLORIDE IN NACL 40-0.9 MEQ/L-% IV SOLN
INTRAVENOUS | Status: DC
  Filled 2019-08-11 (×8): qty 1000

## 2019-08-11 MED ORDER — ACETAMINOPHEN 325 MG PO TABS
650.0000 mg | ORAL_TABLET | Freq: Once | ORAL | Status: AC
  Administered 2019-08-11: 650 mg via ORAL
  Filled 2019-08-11: qty 2

## 2019-08-11 MED ORDER — ACETAMINOPHEN 500 MG PO TABS: 1000.0000 mg | ORAL_TABLET | Freq: Four times a day (QID) | ORAL | Status: DC | PRN

## 2019-08-11 MED ORDER — PENTOXIFYLLINE ER 400 MG PO TBCR
400.0000 mg | EXTENDED_RELEASE_TABLET | Freq: Three times a day (TID) | ORAL | Status: DC
  Administered 2019-08-12 (×3): 400 mg via ORAL
  Filled 2019-08-11 (×8): qty 1

## 2019-08-11 MED ORDER — MAGNESIUM SULFATE 2 GM/50ML IV SOLN
2.0000 g | Freq: Once | INTRAVENOUS | Status: AC
  Administered 2019-08-11: 2 g via INTRAVENOUS
  Filled 2019-08-11: qty 50

## 2019-08-11 MED ORDER — ACETAMINOPHEN 325 MG PO TABS: 650.0000 mg | ORAL_TABLET | Freq: Once | ORAL | Status: AC

## 2019-08-11 MED ORDER — ACETAMINOPHEN 325 MG PO TABS
ORAL_TABLET | ORAL | Status: AC
  Administered 2019-08-11: 325 mg
  Filled 2019-08-11: qty 2

## 2019-08-11 NOTE — Progress Notes (Signed)
GI Inpatient Follow-up Note  Subjective:  Patient seen in follow-up for alcoholic hepatitis and UGI bleed. EGD was scheduled for this morning, but was cancelled as patient had fever of 103. She is resting comfortably this afternoon. No recurrent episodes of vomiting. She denies abdominal pain, hematochezia, or melena. LFTs improving. Tbili down to 7.9 today.   Scheduled Inpatient Medications:  . folic acid  1 mg Oral Daily  . multivitamin with minerals  1 tablet Oral Daily  . thiamine  100 mg Oral Daily   Or  . thiamine  100 mg Intravenous Daily    Continuous Inpatient Infusions:   . 0.9 % NaCl with KCl 40 mEq / L 50 mL/hr at 08/11/19 1132  . cefTRIAXone (ROCEPHIN)  IV Stopped (08/10/19 1904)  . magnesium sulfate bolus IVPB    . octreotide  (SANDOSTATIN)    IV infusion 50 mcg/hr (08/11/19 0650)  . pantoprozole (PROTONIX) infusion 8 mg/hr (08/11/19 0649)    PRN Inpatient Medications:  albuterol, ipratropium, LORazepam **OR** LORazepam, ondansetron **OR** ondansetron (ZOFRAN) IV  Review of Systems: Constitutional: Weight is stable.  Eyes: No changes in vision. ENT: No oral lesions, sore throat.  GI: see HPI.  Heme/Lymph: No easy bruising.  CV: No chest pain.  GU: No hematuria.  Integumentary: No rashes.  Neuro: No headaches.  Psych: No depression/anxiety.  Endocrine: No heat/cold intolerance.  Allergic/Immunologic: No urticaria.  Resp: No cough, SOB.  Musculoskeletal: No joint swelling.    Physical Examination: BP 109/78 (BP Location: Right Arm)   Pulse 100   Temp (!) 100.9 F (38.3 C) (Oral)   Resp (!) 26   Ht 5\' 2"  (1.575 m)   Wt 61.8 kg   SpO2 97%   BMI 24.91 kg/m  Gen: NAD, alert and oriented x 4 HEENT: PEERLA, EOMI, Neck: supple, no JVD or thyromegaly Chest: CTA bilaterally, no wheezes, crackles, or other adventitious sounds CV: RRR, no m/g/c/r Abd: soft, NT, ND, +BS in all four quadrants; no HSM, guarding, ridigity, or rebound tenderness Ext: no edema,  well perfused with 2+ pulses, Skin: no rash or lesions noted Lymph: no LAD  Data: Lab Results  Component Value Date   WBC 5.0 08/10/2019   HGB 10.0 (L) 08/10/2019   HCT 27.9 (L) 08/10/2019   MCV 95.9 08/10/2019   PLT 30 (L) 08/10/2019   Recent Labs  Lab 08/10/19 0555 08/10/19 1223 08/10/19 2125  HGB 10.0* 9.6* 10.0*   Lab Results  Component Value Date   NA 131 (L) 08/11/2019   K 3.3 (L) 08/11/2019   CL 101 08/11/2019   CO2 22 08/11/2019   BUN 15 08/11/2019   CREATININE 0.71 08/11/2019   Lab Results  Component Value Date   ALT 56 (H) 08/11/2019   AST 189 (H) 08/11/2019   ALKPHOS 111 08/11/2019   BILITOT 7.9 (H) 08/11/2019   Recent Labs  Lab 08/10/19 0612  INR 1.8*   Assessment:  44 y/o Caucasian female with a PMH of alcoholic cirrhosis of the liver with known history of esophageal and gastric varices, SLE, Hx of CVA, HTN, Hx of seizures, anxiety and depression, and CKD who presented to the [facility] ED via EMS after being found lying on the floor intoxicated  1. Alcoholic cirrhosis with ascites - MELD 22, Child's Class C (10 pts)  2. Alcoholic hepatitis - Maddrey DF 34  3. Coagulopathic - INR 1.8, platelets 26K  4. Hematemesis - concern for UGI bleed given known hx of esophageal varices, she is appropriately  on IV Protonix and octreotide, hemodynamically stable with no evidence of active GI bleeding  5. Pyelonephritis - on IV antibiotics, primary team following. Low grade fever this morning.  6. Transaminitis - improving, AST 189, ALT 56  COVID-19 Test - NEGATIVE  Recommendations:  1. EGD cancelled today due to fever of 103, likely 2/2 sepsis from her acute pyelonephritis.  2. Continue IV acid suppression and octreotide 3. No signs of overt gastrointestinal bleeding 4. Due to Maddrey DF 34, pt would benefit from treatment with pentoxifylline 400 mg TID. Tbili improving - 7.9 this morning 5. Low-sodium (<2 grams/daily) diet today 6. EGD when  clinically feasible - likely over the weekend 7. Following    Please call with questions or concerns.    Octavia Bruckner, PA-C [facility] [phone] (Cell)

## 2019-08-11 NOTE — Progress Notes (Signed)
[facility] NAME: Chloe [name]    MR#:  [id number]  DATE OF BIRTH:  [birth year]  SUBJECTIVE:   Patient was brought in from home a by EMS after she was found fallen down on the floor by her father. Patient has history of chronic alcoholism with significant liver disorder. She reports drinking a glass of wine and a glass of vodka on a daily basis. She has been vomiting bright red blood mixed with coffee ground emesis few days ago. Hasn't vomited lately.    This morning having high-grade fever 103 tachycardic. Very dry mouth wants to drink water.  REVIEW OF SYSTEMS:   Review of Systems  Constitutional: Positive for malaise/fatigue. Negative for chills, fever and weight loss.  HENT: Negative for ear discharge, ear pain and nosebleeds.   Eyes: Negative for blurred vision, pain and discharge.  Respiratory: Negative for sputum production, shortness of breath, wheezing and stridor.   Cardiovascular: Negative for chest pain, palpitations, orthopnea and PND.  Gastrointestinal: Positive for vomiting. Negative for abdominal pain, diarrhea and nausea.  Genitourinary: Negative for frequency and urgency.  Musculoskeletal: Positive for falls. Negative for back pain and joint pain.  Neurological: Positive for weakness. Negative for sensory change, speech change and focal weakness.  Psychiatric/Behavioral: Negative for depression and hallucinations. The patient is not nervous/anxious.    Tolerating Diet:clears Tolerating PT: pending  DRUG ALLERGIES:   Allergies  Allergen Reactions   Bee Venom     Shortness of breath   Sumatriptan Anaphylaxis    VITALS:  Blood pressure 109/78, pulse 100, temperature 99.1 F (37.3 C), temperature source Oral, resp. rate (!) 26, height 5\' 2"  (1.575 m), weight 61.8 kg, SpO2 97 %.  PHYSICAL EXAMINATION:   Physical Exam  GENERAL:  44 y.o.-year-old patient lying in the bed with no acute distress. Looks older  than her stated age. Appears acutely  ill. EYES: Pupils equal, round, reactive to light and accommodation.   severe scleral icterus.   HEENT: Head atraumatic, normocephalic. Oropharynx and nasopharynx dry oral mucosa NECK:  Supple, no jugular venous distention. No thyroid enlargement, no tenderness.  LUNGS: Normal breath sounds bilaterally, no wheezing, rales, rhonchi. No use of accessory muscles of respiration.  CARDIOVASCULAR: S1, S2 normal. No murmurs, rubs, or gallops. Tachycardia++ ABDOMEN: Soft, nontender, nondistended. Bowel sounds present. No organomegaly or mass.  EXTREMITIES: No cyanosis, clubbing or edema b/l.    NEUROLOGIC: Cranial nerves II through XII are intact. No focal Motor or sensory deficits b/l. Generalized weakness +, very weak PSYCHIATRIC:  patient is alert and oriented x 3.  SKIN: multiple bruises with the body large bruise on the right wrist.  LABORATORY PANEL:  CBC Recent Labs  Lab 08/10/19 2125  WBC 5.0  HGB 10.0*  HCT 27.9*  PLT 30*    Chemistries  Recent Labs  Lab 08/11/19 0408  NA 131*  K 3.3*  CL 101  CO2 22  GLUCOSE 156*  BUN 15  CREATININE 0.71  CALCIUM 7.6*  MG 1.3*  AST 189*  ALT 56*  ALKPHOS 111  BILITOT 7.9*   Cardiac Enzymes No results for input(s): TROPONINI in the last 168 hours. RADIOLOGY:  DG Wrist Complete Right  Result Date: 08/09/2019 CLINICAL DATA:  Fall with pain and bruising. EXAM: RIGHT WRIST - COMPLETE 3+ VIEW COMPARISON:  None. FINDINGS: There is no evidence of fracture or dislocation. There is no evidence of arthropathy or other focal bone abnormality. Soft tissues are unremarkable.  IMPRESSION: Negative. Electronically Signed   By: Nelson Chimes M.D.   On: 08/09/2019 17:08   CT ABDOMEN PELVIS W CONTRAST  Result Date: 08/10/2019 CLINICAL DATA:  Abdominal swelling, ascites suspected EXAM: CT ABDOMEN AND PELVIS WITH CONTRAST TECHNIQUE: Multidetector CT imaging of the abdomen and pelvis was performed using the standard  protocol following bolus administration of intravenous contrast. CONTRAST:  131mL OMNIPAQUE IOHEXOL 300 MG/ML  SOLN COMPARISON:  None FINDINGS: Lower chest: Atelectatic changes in the lung bases, right slightly greater than left. Included lungs are otherwise clear. Mild cardiomegaly with right atrial enlargement. Slight compression of the right heart by a pectus deformity of the chest (Haller index 3.7). Bilateral breast prostheses with minimal capsular calcification. Hepatobiliary: Diffuse hepatic hypoattenuation with some sparing along the gallbladder fossa which could reflect some diffuse fatty infiltration. A a markedly nodular hepatic surface contour is present as well with more global heterogeneity likely denoting numerous regenerative nodules and stigmata of cirrhosis. No focal concerning liver lesion is identified. There is moderate gallbladder distention with few dependently layering calcified gallstones and some mild gallbladder wall thickening, which is nonspecific in the setting of intrinsic liver disease. Pancreas: Unremarkable. No pancreatic ductal dilatation or surrounding inflammatory changes. Spleen: Borderline splenomegaly.  No concerning splenic lesions. Adrenals/Urinary Tract: Normal adrenal glands. Subcentimeter hypodense focus in the lower pole right kidney too small to fully characterize on CT imaging but statistically likely benign. There is asymmetric right perinephric stranding with a diffusely striated right nephrogram including more heterogeneous attenuation in the right lower pole. Features are much more conspicuous on the delayed phase imaging where slight striations of the left nephrogram is noted as well. There is mild bilateral hydroureteronephrosis to the level of the urinary bladder but without obstructing urolithiasis. Mild urothelial thickening noted as well. Few punctate nonobstructing calculi are present in the upper pole left kidney and lower pole right kidney. There is  moderate bladder distention but without gross bladder wall thickening. Stomach/Bowel: There is a small sliding-type hiatal hernia. Extensive paraesophageal venous collateralization and varix formation as well as additional varices and collaterals along the lesser curvature of the proximal stomach. Mild gastric wall thickening is nonspecific. Duodenum takes a normal course across the midline abdomen. Much of the small bowel is fluid-filled but nondistended. Slightly mobile cecum directed towards the left upper quadrant. Normal air-filled appendix without inflammation. Moderate to large colonic stool burden. No colonic dilatation or wall thickening. Vascular/Lymphatic: Paraesophageal and gastric varices. Additional upper abdominal collaterals including engorgement of the splenic vein. Recanalization of the umbilical vein. Portal and superior mesenteric veins are normally opacified. Hepatic veins normally opacified. No aortic aneurysm or ectasia. Some edematous central lymph nodes. No pathologically enlarged nodes in the abdomen or pelvis. Reproductive: Anteverted uterus.  No concerning adnexal lesions. Other: Small volume ascites predominantly in the subphrenic spaces some central edematous mesenteric changes as well. No bowel containing hernia. No free abdominopelvic air, organized collection or abscess. Musculoskeletal: Few ill-defined lucencies in the and ilia likely reflect marrow changes in the absence of known malignancy. No acute or worrisome osseous lesions. Multilevel degenerative changes are present in the imaged portions of the spine. No acute osseous abnormality or suspicious osseous lesion. IMPRESSION: 1. Features of acute bilateral pyelonephritis, right more pronounced than left, with concomitant mild bilateral hydroureteronephrosis and urothelial thickening. Correlate with urinalysis. 2. Stigmata of cirrhosis with sequela of portal hypertension including splenomegaly, extensive paraesophageal and  gastric varices, recanalized umbilical vein and additional upper abdominal collaterals as well as small volume  ascites and splenomegaly. 3. Cholelithiasis with mild gallbladder wall thickening with distension. Wall thickening is nonspecific in the setting of intrinsic liver disease however the distension may be the initial presentation of cholecystitis. If there is clinical concern, consider further evaluation with right upper quadrant ultrasound. 4. Moderate to large colonic stool burden. Correlate for features of constipation. 5. Few ill-defined lucencies ilia likely reflect marrow changes in the absence of known malignancy. Correlate with history. 6. Mild cardiomegaly with right atrial enlargement. 7. Slight compression of the right heart by a pectus deformity of the chest (Haller index 3.7). 8. Aortic Atherosclerosis (ICD10-I70.0). Electronically Signed   By: Lovena Le M.D.   On: 08/10/2019 05:54   ASSESSMENT AND PLAN:  Chloe [name] is a 44 y.o. female with a known history of alcohol use disorder with withdrawal syndrome, CKD, anxiety/depression, hypertension, lupus, seizures presents to the emergency department for evaluation of alcohol intoxication.  Last drink was this morning.  She was reportedly found on the floor intoxicated this morning by her father with bruising to multiple areas of her body.  Patient's father brought her to the emergency department today for help with her alcoholism.  Upon further questioning the patient reports that she did vomit blood 3 days ago but has not had any hematemesis since.  #.  Alcohol use disorder with intoxication, alcoholic liver disease MELD score of 29 with electrolyte derangement -Maddery score 34 -GI consultation with Dr Estil Daft on hold due to high grade fever--will be done over the weekend -Correct electrolytes -Monitor LFTs and INR -Continue Lasix and spironolactone -CIWA protocol with banana bag -Seizure and fall precautions -Social work  consultation for discussion of detox and rehabilitation options  # Upper GI bleed-vitals improved with fluids, H&H stable -- patient likely has variceal bleed given history of alcoholic cirrhosis of liver. -IV Protonix  and octreotide drip -Serial CBCs--stable -away G.I. consultation appreciated. Pt will get EGD over the weekend -Hold anticoagulants  #Acquired coagulopathy secondary to liver cirrhosis -low platelet count, elevated PT/INR  #. Sepsis due to UTI/acute pyelonephritis as noted on CT abdomen--POA -IV Rocephin -UC ecoli -pt has fever tachycardia, elevated Pro calcitonin, abnormal UA, tachypnea, hypotension  #.  History of depression/anxiety -Continue Cymbalta  #.  History of hypothyroidism -Continue Synthroid   Diet/Nutrition: CLD DVT Px: SCDs and early ambulation. Chemoprophylaxis would be contraindicated at this point secondary to active GI bleeding. Code Status: Full.  Advanced directives were discussed at length with the patient who expresses understanding and agrees with the plan  Family communication : spoke with father Consults : G.I. consultation   Status is: Inpatient  Remains inpatient appropriate because:IV treatments appropriate due to intensity of illness or inability to take PO and Inpatient level of care appropriate due to severity of illness   Dispo: The patient is from: Home              Anticipated d/c is to: Home              Anticipated d/c date is: > 3 days               Patient currently is not medically stable to d/c.  Severely ill with G.I. bleed, chronic alcoholism, bilateral pyelonephritis.  Await G.I. evaluation for further workup.     TOTAL TIME TAKING CARE OF THIS PATIENT: *35* minutes.  >50% time spent on counselling and coordination of care  Note: This dictation was prepared with Dragon dictation along with smaller phrase technology. Any transcriptional  errors that result from this process are unintentional.  Fritzi Mandes  M.D    [facility]   CC: Primary care physician; Shirley ID: Kaelei Wheeler, female   DOB: [birth year], 44 y.o.   MRN: [id number]

## 2019-08-11 NOTE — Consult Note (Addendum)
PHARMACY CONSULT NOTE - FOLLOW UP  Pharmacy Consult for Electrolyte Monitoring and Replacement   Recent Labs: Potassium (mmol/L)  Date Value  08/11/2019 3.3 (L)   Magnesium (mg/dL)  Date Value  08/11/2019 1.3 (L)   Calcium (mg/dL)  Date Value  08/11/2019 7.6 (L)   Albumin (g/dL)  Date Value  08/11/2019 2.4 (L)   Phosphorus (mg/dL)  Date Value  08/09/2019 2.6   Sodium (mmol/L)  Date Value  08/11/2019 131 (L)   Corrected Ca: 8.9 mg/dL  Assessment: 44 y.o.femalewith a known history of alcohol use disorder with withdrawal syndrome, CKD, anxiety/depression, hypertension, lupus, seizures presents to the emergency department for evaluation of alcohol intoxication. MIVF: 0.9% NaCl at 50 mL/hr  Goal of Therapy:  Potassium 4.0 - 5.1 mmol/L Magnesium 2.0 - 2.4 mg/dL All Other Electrolytes WNL  Plan:   Add KCl 40 mEq/L to 0.9% NaCl and continue at 50 mL/hr  IV magnesium sulfate 4 grams x 1  F/u electrolytes in am and replace as needed  Dallie Piles ,PharmD Clinical Pharmacist 08/11/2019 10:05 AM

## 2019-08-12 DIAGNOSIS — N390 Urinary tract infection, site not specified: Secondary | ICD-10-CM

## 2019-08-12 DIAGNOSIS — K7200 Acute and subacute hepatic failure without coma: Secondary | ICD-10-CM

## 2019-08-12 LAB — AMMONIA: Ammonia: 45 umol/L — ABNORMAL HIGH (ref 9–35)

## 2019-08-12 LAB — HEPATIC FUNCTION PANEL
ALT: 68 U/L — ABNORMAL HIGH (ref 0–44)
AST: 203 U/L — ABNORMAL HIGH (ref 15–41)
Albumin: 2.5 g/dL — ABNORMAL LOW (ref 3.5–5.0)
Alkaline Phosphatase: 131 U/L — ABNORMAL HIGH (ref 38–126)
Bilirubin, Direct: 7.6 mg/dL — ABNORMAL HIGH (ref 0.0–0.2)
Indirect Bilirubin: 4 mg/dL — ABNORMAL HIGH (ref 0.3–0.9)
Total Bilirubin: 11.6 mg/dL — ABNORMAL HIGH (ref 0.3–1.2)
Total Protein: 6.1 g/dL — ABNORMAL LOW (ref 6.5–8.1)

## 2019-08-12 LAB — CBC
HCT: 27.9 % — ABNORMAL LOW (ref 36.0–46.0)
Hemoglobin: 9.9 g/dL — ABNORMAL LOW (ref 12.0–15.0)
MCH: 33.9 pg (ref 26.0–34.0)
MCHC: 35.5 g/dL (ref 30.0–36.0)
MCV: 95.5 fL (ref 80.0–100.0)
Platelets: 42 10*3/uL — ABNORMAL LOW (ref 150–400)
RBC: 2.92 MIL/uL — ABNORMAL LOW (ref 3.87–5.11)
RDW: 14.3 % (ref 11.5–15.5)
WBC: 6.9 10*3/uL (ref 4.0–10.5)
nRBC: 0 % (ref 0.0–0.2)

## 2019-08-12 LAB — HEPATITIS B SURFACE ANTIBODY, QUANTITATIVE: Hep B S AB Quant (Post): 256.1 m[IU]/mL (ref >9.9)

## 2019-08-12 LAB — RENAL FUNCTION PANEL
Albumin: 2.4 g/dL — ABNORMAL LOW (ref 3.5–5.0)
Anion gap: 9 (ref 5–15)
BUN: 11 mg/dL (ref 6–20)
CO2: 21 mmol/L — ABNORMAL LOW (ref 22–32)
Calcium: 7.9 mg/dL — ABNORMAL LOW (ref 8.9–10.3)
Chloride: 104 mmol/L (ref 98–111)
Creatinine, Ser: 0.55 mg/dL (ref 0.44–1.00)
GFR calc Af Amer: >60 mL/min (ref >60)
GFR calc non Af Amer: >60 mL/min (ref >60)
Glucose, Bld: 183 mg/dL — ABNORMAL HIGH (ref 70–99)
Phosphorus: <1 mg/dL — CL (ref 2.5–4.6)
Potassium: 3.5 mmol/L (ref 3.5–5.1)
Sodium: 134 mmol/L — ABNORMAL LOW (ref 135–145)

## 2019-08-12 LAB — PROCALCITONIN: Procalcitonin: 1.98 ng/mL

## 2019-08-12 LAB — MAGNESIUM: Magnesium: 2.1 mg/dL (ref 1.7–2.4)

## 2019-08-12 LAB — PROTIME-INR
INR: 1.9 — ABNORMAL HIGH (ref 0.8–1.2)
Prothrombin Time: 20.7 seconds — ABNORMAL HIGH (ref 11.4–15.2)

## 2019-08-12 MED ORDER — DULOXETINE HCL 20 MG PO CPEP
20.0000 mg | ORAL_CAPSULE | Freq: Every day | ORAL | Status: DC
  Administered 2019-08-12: 20 mg via ORAL
  Filled 2019-08-12 (×2): qty 1

## 2019-08-12 MED ORDER — LEVOTHYROXINE SODIUM 50 MCG PO TABS
75.0000 ug | ORAL_TABLET | Freq: Every day | ORAL | Status: DC
  Administered 2019-08-12 – 2019-08-18 (×3): 75 ug via ORAL
  Filled 2019-08-12: qty 1
  Filled 2019-08-12: qty 2
  Filled 2019-08-12: qty 1
  Filled 2019-08-12 (×2): qty 2

## 2019-08-12 MED ORDER — PROMETHAZINE HCL 25 MG/ML IJ SOLN
12.5000 mg | Freq: Four times a day (QID) | INTRAMUSCULAR | Status: DC | PRN
  Administered 2019-08-12 – 2019-08-13 (×3): 12.5 mg via INTRAVENOUS
  Filled 2019-08-12 (×3): qty 1

## 2019-08-12 MED ORDER — POTASSIUM CHLORIDE CRYS ER 20 MEQ PO TBCR
20.0000 meq | EXTENDED_RELEASE_TABLET | Freq: Once | ORAL | Status: AC
  Administered 2019-08-12: 20 meq via ORAL
  Filled 2019-08-12: qty 1

## 2019-08-12 MED ORDER — LORAZEPAM 1 MG PO TABS: 1.0000 mg | ORAL_TABLET | ORAL | Status: DC | PRN

## 2019-08-12 MED ORDER — LORAZEPAM 2 MG/ML IJ SOLN
1.0000 mg | INTRAMUSCULAR | Status: DC | PRN
  Administered 2019-08-12: 2 mg via INTRAVENOUS
  Administered 2019-08-13: 1 mg via INTRAVENOUS
  Administered 2019-08-13 (×2): 2 mg via INTRAVENOUS
  Administered 2019-08-13: 1 mg via INTRAVENOUS
  Administered 2019-08-13: 2 mg via INTRAVENOUS
  Filled 2019-08-12 (×6): qty 1

## 2019-08-12 MED ORDER — LACTULOSE 10 GM/15ML PO SOLN
30.0000 g | Freq: Two times a day (BID) | ORAL | Status: DC
  Filled 2019-08-12: qty 60

## 2019-08-12 MED ORDER — SODIUM PHOSPHATES 45 MMOLE/15ML IV SOLN
30.0000 mmol | Freq: Once | INTRAVENOUS | Status: AC
  Administered 2019-08-12: 30 mmol via INTRAVENOUS
  Filled 2019-08-12: qty 10

## 2019-08-12 NOTE — Progress Notes (Signed)
PROGRESS NOTE    Chloe [name]  QJJ:[id number] DOB: 07/21/1975 DOA: 08/09/2019 PCP: [facility]   Brief Narrative:  Patient was brought in from home a by EMS after she was found fallen down on the floor by her father. Patient has history of chronic alcoholism with significant liver disorder. She reports drinking a glass of wine and a glass of vodka on a daily basis. She has been vomiting bright red blood mixed with coffee ground emesis few days ago. Hasn't vomited lately.   Developed high-grade fever with tachycardia.  Started on Rocephin for E. coli UTI.  EGD over the weekend.  Subjective: Patient has no new complaints today.  She was sleeping comfortably but easily arousable, alert and oriented.  Assessment & Plan:   Active Problems:   Upper GI bleed  Alcohol use disorder with intoxication, alcoholic liver disease MELDscore of 29 with electrolyte derangement -Maddery score 34 -GI consultation with Dr Estil Daft on hold due to high grade fever--will be done over the weekend -Correct electrolytes -Monitor LFTs and INR -Continue Lasix and spironolactone -CIWA protocol with banana bag -Seizure and fall precautions -Social work consultation for discussion of detox and rehabilitation options   UpperGI bleed-vitals improved with fluids, H&H stable -- patient likely has variceal bleed given history of alcoholic cirrhosis of liver. -IV Protonix and octreotide drip-we will continue for total of 72 hours. -Serial CBCs--stable - G.I. consultation appreciated. Pt will get EGD over the weekend -Holding anticoagulants  Acquired coagulopathy secondary to liver cirrhosis -low platelet count, elevated PT/INR  Sepsis due to UTI/acute pyelonephritis as noted on CT abdomen--POA -IV Rocephin -UC-pansensitive ecoli -Continue Rocephin to complete a 7-day course, if she becomes stable for discharge after EGD, she can complete the course p.o. with Keflex  History of  depression/anxiety -Continue Cymbalta  History of hypothyroidism -Continue Synthroid  Objective: Vitals:   08/12/19 0024 08/12/19 0431 08/12/19 0521 08/12/19 1155  BP: 92/64 102/67 113/77 123/66  Pulse: (!) 103 93 94 87  Resp: 18 (!) 24 (!) 24 16  Temp: 100.1 F (37.8 C) 99.7 F (37.6 C) 99 F (37.2 C) 99.3 F (37.4 C)  TempSrc: Oral Oral Oral Oral  SpO2: 98% 97% 97% 97%  Weight:      Height:        Intake/Output Summary (Last 24 hours) at 08/12/2019 1334 Last data filed at 08/12/2019 1000 Gross per 24 hour  Intake 3534.43 ml  Output 1300 ml  Net 2234.43 ml   Filed Weights   08/09/19 1618 08/10/19 2127  Weight: 57.6 kg 61.8 kg    Examination:  General exam: Appears calm and comfortable  Respiratory system: Clear to auscultation. Respiratory effort normal. Cardiovascular system: S1 & S2 heard, RRR. No JVD, murmurs, rubs, gallops or clicks. Gastrointestinal system:  nontender, distended, bowel sounds positive. Central nervous system: Alert and oriented. No focal neurological deficits. Extremities: No edema, no cyanosis, pulses intact and symmetrical. Psychiatry: Judgement and insight appear normal.   DVT prophylaxis: SCDs. Code Status: Full Family Communication: Discussed with patient. Disposition Plan:  Status is: Inpatient  Remains inpatient appropriate because:Inpatient level of care appropriate due to severity of illness   Dispo: The patient is from: Home              Anticipated d/c is to: Home              Anticipated d/c date is: 2 days  Patient currently is not medically stable to d/c.  Consultants:   GI  Procedures:  Antimicrobials:  Rocephin  Data Reviewed: I have personally reviewed following labs and imaging studies  CBC: Recent Labs  Lab 08/10/19 0016 08/10/19 0555 08/10/19 1223 08/10/19 2125 08/12/19 0538  WBC 7.7 5.5 5.6 5.0 6.9  HGB 10.1* 10.0* 9.6* 10.0* 9.9*  HCT 28.7* 28.3* 26.9* 27.9* 27.9*  MCV 95.7 96.6  95.4 95.9 95.5  PLT 25* 23* 26* 30* 42*   Basic Metabolic Panel: Recent Labs  Lab 08/09/19 1624 08/09/19 2057 08/10/19 0555 08/11/19 0408 08/12/19 0538  NA 124*  --  129* 131* 134*  K 3.1*  --  2.8* 3.3* 3.5  CL 80*  --  94* 101 104  CO2 26  --  22 22 21*  GLUCOSE 97  --  76 156* 183*  BUN 25*  --  24* 15 11  CREATININE 0.98  --  0.98 0.71 0.55  CALCIUM 8.0*  --  7.0* 7.6* 7.9*  MG  --  0.9* 1.8 1.3* 2.1  PHOS  --  2.6  --   --  <1.0*   GFR: Estimated Creatinine Clearance: 78.4 mL/min (by C-G formula based on SCr of 0.55 mg/dL). Liver Function Tests: Recent Labs  Lab 08/09/19 1624 08/11/19 0408 08/12/19 0538  AST 314* 189*  --   ALT 76* 56*  --   ALKPHOS 117 111  --   BILITOT 11.7* 7.9*  --   PROT 7.7 5.8*  --   ALBUMIN 3.8 2.4* 2.4*   No results for input(s): LIPASE, AMYLASE in the last 168 hours. Recent Labs  Lab 08/09/19 2057  AMMONIA 31   Coagulation Profile: Recent Labs  Lab 08/09/19 1624 08/10/19 0612  INR 1.8* 1.8*   Cardiac Enzymes: No results for input(s): CKTOTAL, CKMB, CKMBINDEX, TROPONINI in the last 168 hours. BNP (last 3 results) No results for input(s): PROBNP in the last 8760 hours. HbA1C: No results for input(s): HGBA1C in the last 72 hours. CBG: No results for input(s): GLUCAP in the last 168 hours. Lipid Profile: No results for input(s): CHOL, HDL, LDLCALC, TRIG, CHOLHDL, LDLDIRECT in the last 72 hours. Thyroid Function Tests: No results for input(s): TSH, T4TOTAL, FREET4, T3FREE, THYROIDAB in the last 72 hours. Anemia Panel: Recent Labs    08/09/19 1705 08/10/19 0016  VITAMINB12 1,011*  --   FOLATE  --  24.0   Sepsis Labs: Recent Labs  Lab 08/10/19 0555 08/10/19 1114 08/11/19 0408 08/12/19 0538  PROCALCITON 4.18  --  2.78 1.98  LATICACIDVEN 1.2 1.1  --   --     Recent Results (from the past 240 hour(s))  Urine Culture     Status: Abnormal   Collection Time: 08/09/19  5:27 PM   Specimen: Urine, Random  Result  Value Ref Range Status   Specimen Description   Final    URINE, RANDOM Performed at [facility], [address] [place], [place] [address]    Special Requests   Final    NONE Performed at [facility], [address] [place], [place] [address]    Culture >=100,000 COLONIES/mL ESCHERICHIA COLI (A)  Final   Report Status 08/11/2019 FINAL  Final   Organism ID, Bacteria ESCHERICHIA COLI (A)  Final      Susceptibility   Escherichia coli - MIC*    AMPICILLIN <=2 SENSITIVE Sensitive     CEFAZOLIN <=4 SENSITIVE Sensitive     CEFTRIAXONE <=1 SENSITIVE Sensitive  CIPROFLOXACIN <=0.25 SENSITIVE Sensitive     GENTAMICIN <=1 SENSITIVE Sensitive     IMIPENEM <=0.25 SENSITIVE Sensitive     NITROFURANTOIN <=16 SENSITIVE Sensitive     TRIMETH/SULFA <=20 SENSITIVE Sensitive     AMPICILLIN/SULBACTAM <=2 SENSITIVE Sensitive     PIP/TAZO <=4 SENSITIVE Sensitive     * >=100,000 COLONIES/mL ESCHERICHIA COLI  SARS Coronavirus 2 by RT PCR (hospital order, performed in [facility] hospital lab) Nasopharyngeal Nasopharyngeal Swab     Status: None   Collection Time: 08/09/19  7:30 PM   Specimen: Nasopharyngeal Swab  Result Value Ref Range Status   SARS Coronavirus 2 NEGATIVE NEGATIVE Final    Comment: (NOTE) SARS-CoV-2 target nucleic acids are NOT DETECTED. The SARS-CoV-2 RNA is generally detectable in upper and lower respiratory specimens during the acute phase of infection. The lowest concentration of SARS-CoV-2 viral copies this assay can detect is 250 copies / mL. A negative result does not preclude SARS-CoV-2 infection and should not be used as the sole basis for treatment or other patient management decisions.  A negative result may occur with improper specimen collection / handling, submission of specimen other than nasopharyngeal swab, presence of viral mutation(s) within the areas targeted by this assay, and inadequate number of viral copies (<250 copies / mL). A  negative result must be combined with clinical observations, patient history, and epidemiological information. Fact Sheet for Patients:   StrictlyIdeas.no Fact Sheet for Healthcare Providers: BankingDealers.co.za This test is not yet approved or cleared  by the [place] FDA and has been authorized for detection and/or diagnosis of SARS-CoV-2 by FDA under an Emergency Use Authorization (EUA).  This EUA will remain in effect (meaning this test can be used) for the duration of the COVID-19 declaration under Section 564(b)(1) of the Act, 21 U.S.C. section 360bbb-3(b)(1), unless the authorization is terminated or revoked sooner. Performed at [facility], [address] [place], [place] [address]      Radiology Studies: No results found.  Scheduled Meds: . DULoxetine  20 mg Oral Daily  . folic acid  1 mg Oral Daily  . levothyroxine  75 mcg Oral Q0600  . multivitamin with minerals  1 tablet Oral Daily  . pentoxifylline  400 mg Oral TID WC  . thiamine  100 mg Oral Daily   Or  . thiamine  100 mg Intravenous Daily   Continuous Infusions: . 0.9 % NaCl with KCl 40 mEq / L 125 mL/hr at 08/12/19 0354  . cefTRIAXone (ROCEPHIN)  IV Stopped (08/11/19 1749)  . octreotide  (SANDOSTATIN)    IV infusion 50 mcg/hr (08/12/19 1104)  . pantoprozole (PROTONIX) infusion 8 mg/hr (08/12/19 0533)  . sodium phosphate  Dextrose 5% IVPB 30 mmol (08/12/19 0919)     LOS: 3 days   Time spent: 40 minutes.  Lorella Nimrod, MD [facility]  If 7PM-7AM, please contact night-coverage [email or web address]  08/12/2019, 1:34 PM   This record has been created using Systems analyst. Errors have been sought and corrected,but may not always be located. Such creation errors do not reflect on the standard of care.

## 2019-08-12 NOTE — Progress Notes (Signed)
GI Inpatient Follow-up Note  Subjective:  Patient seen in follow-up for alcoholic hepatitis. No acute overnight events. She episodes of recurrent vomiting. Hemoglobin this morning 9.9. Liver enzymes or INR not checked today. Patient is alert to self today and oriented to place, but appears confused. She reports "I hurt all over." No family present at bedside.   Scheduled Inpatient Medications:  . DULoxetine  20 mg Oral Daily  . folic acid  1 mg Oral Daily  . levothyroxine  75 mcg Oral Q0600  . multivitamin with minerals  1 tablet Oral Daily  . pentoxifylline  400 mg Oral TID WC  . thiamine  100 mg Oral Daily   Or  . thiamine  100 mg Intravenous Daily    Continuous Inpatient Infusions:   . 0.9 % NaCl with KCl 40 mEq / L 125 mL/hr at 08/12/19 1500  . cefTRIAXone (ROCEPHIN)  IV 2 g (08/12/19 1609)  . octreotide  (SANDOSTATIN)    IV infusion 50 mcg/hr (08/12/19 1500)  . pantoprozole (PROTONIX) infusion 8 mg/hr (08/12/19 1600)    PRN Inpatient Medications:  albuterol, ipratropium, LORazepam **OR** LORazepam, ondansetron **OR** ondansetron (ZOFRAN) IV, promethazine  Review of Systems:  Unable to obtain due to patient's critical illness  Physical Examination: BP 128/81 (BP Location: Left Leg)   Pulse 92   Temp 100 F (37.8 C) (Oral)   Resp 16   Ht 5\' 2"  (1.575 m)   Wt 61.8 kg   SpO2 98%   BMI 24.91 kg/m  Gen: NAD, alert to self and oriented to place HEENT: PEERLA, EOMI, Neck: supple, no JVD or thyromegaly Chest: CTA bilaterally, no wheezes, crackles, or other adventitious sounds CV: RRR, no m/g/c/r Abd: soft, mild distention, hypoactive BS in all four quadrants; tenderness to palpation in RUQ and RLQ, no HSM, guarding, ridigity, or rebound tenderness, +fluid wave Ext: no edema, well perfused with 2+ pulses, Skin: no rash or lesions noted Lymph: no LAD  Data: Lab Results  Component Value Date   WBC 6.9 08/12/2019   HGB 9.9 (L) 08/12/2019   HCT 27.9 (L) 08/12/2019    MCV 95.5 08/12/2019   PLT 42 (L) 08/12/2019   Recent Labs  Lab 08/10/19 1223 08/10/19 2125 08/12/19 0538  HGB 9.6* 10.0* 9.9*   Lab Results  Component Value Date   NA 134 (L) 08/12/2019   K 3.5 08/12/2019   CL 104 08/12/2019   CO2 21 (L) 08/12/2019   BUN 11 08/12/2019   CREATININE 0.55 08/12/2019   Lab Results  Component Value Date   ALT 56 (H) 08/11/2019   AST 189 (H) 08/11/2019   ALKPHOS 111 08/11/2019   BILITOT 7.9 (H) 08/11/2019   Recent Labs  Lab 08/10/19 0612  INR 1.8*   Assessment:  44 y/o Caucasian female with a PMH of alcoholic cirrhosis of the liver with known history of esophageal and gastric varices, SLE, Hx of CVA, HTN, Hx of seizures, anxiety and depression, and CKD who presented to the [facility] ED via EMS after being found lying on the floor intoxicated  1. Alcoholic cirrhosis with ascites - MELD 22, Child's Class C (10 pts) -evidence of hepatic encephalopathy today with confusion, somnolence   2. Alcoholic hepatitis - Maddrey DF 34, Pentoxifylline started yesterday  3. Coagulopathic - INR 1.8, platelets 26K  4. Hematemesis - concern for UGI bleed given known hx of esophageal varices, she is appropriately on IV Protonix and octreotide, hemodynamically stable with no evidence of active GI bleeding. D/C octreotide  tomorrow. No evidence of recurrent vomiting. Hemodynamically stable.   5. Pyelonephritis - on IV antibiotics, primary team following. Still having fever.  6. Transaminitis - yesterday AST 189, ALT 56  COVID-19 Test - NEGATIVE  Recommendations:  1. Patient appears clinically worse today with signs of hepatic encephalopathy and further hepatic decompensation 2/2 alcoholic hepatitis versus worsening pyelonephritis 2. Ordered HFP, INR, and ammonia 3. Start lactulose 30 cc BID - goal of 2-3 soft BMs daily 3. EGD was cancelled yesterday given fever of 103. She is still having low-grade fever to 100 likely 2/2 ongoing pyelonephritis 4.  Continue Pentoxifylline 400 mg TID for treatment of alcoholic hepatitis  5. Continue serial examinations 6. Dr. Bonna Gains will take over patient's care tomorrow and Sunday 7. Would anticipate EGD over the weekend if fever resolves, but no urgent endoscopy needed given no overt signs of bleeding and no hemodynamic changes  Prognosis is guarded. Patient is at high-risk for further hepatic decompensation.   Please call with questions or concerns.   Octavia Bruckner, PA-C [facility] [phone] (Cell)

## 2019-08-12 NOTE — Consult Note (Signed)
[place] for Electrolyte Monitoring and Replacement   Recent Labs: Potassium (mmol/L)  Date Value  08/12/2019 3.5   Magnesium (mg/dL)  Date Value  08/12/2019 2.1   Calcium (mg/dL)  Date Value  08/12/2019 7.9 (L)   Albumin (g/dL)  Date Value  08/12/2019 2.4 (L)   Phosphorus (mg/dL)  Date Value  08/12/2019 <1.0 (LL)   Sodium (mmol/L)  Date Value  08/12/2019 134 (L)   Corrected Ca: 9.2 mg/dL  Assessment: 44 y.o.femalewith a known history of alcohol use disorder with withdrawal syndrome, CKD, anxiety/depression, hypertension, lupus, seizures presents to the emergency department for evaluation of alcohol intoxication. MIVF: 0.9% NaCl w/ 40 mEq/L at 125 mL/hr  Goal of Therapy:  Potassium 4.0 - 5.1 mmol/L Magnesium 2.0 - 2.4 mg/dL All Other Electrolytes WNL  Plan:   Continue 0.9% NaCl w/ 40 mEq/L at 125 mL/hr  Oral KCl 20 mEq x 1  IV sodium phosphate 30 mmol x 1  F/u electrolytes in am and replace as needed  Dallie Piles ,PharmD Clinical Pharmacist 08/12/2019 7:02 AM

## 2019-08-12 NOTE — Care Management Important Message (Signed)
Important Message  Patient Details  Name: Chloe [name] MRN: [id number] Date of Birth: 06/10/1975   Medicare Important Message Given:  Yes     Dannette Barbara 08/12/2019, 12:12 PM

## 2019-08-13 ENCOUNTER — Inpatient Hospital Stay: Payer: Medicare PPO

## 2019-08-13 DIAGNOSIS — N390 Urinary tract infection, site not specified: Secondary | ICD-10-CM

## 2019-08-13 DIAGNOSIS — E872 Acidosis, unspecified: Secondary | ICD-10-CM

## 2019-08-13 DIAGNOSIS — J9601 Acute respiratory failure with hypoxia: Secondary | ICD-10-CM

## 2019-08-13 DIAGNOSIS — K7200 Acute and subacute hepatic failure without coma: Secondary | ICD-10-CM

## 2019-08-13 DIAGNOSIS — K652 Spontaneous bacterial peritonitis: Secondary | ICD-10-CM

## 2019-08-13 DIAGNOSIS — K922 Gastrointestinal hemorrhage, unspecified: Secondary | ICD-10-CM

## 2019-08-13 LAB — RENAL FUNCTION PANEL
Albumin: 2.5 g/dL — ABNORMAL LOW (ref 3.5–5.0)
Anion gap: 9 (ref 5–15)
BUN: 9 mg/dL (ref 6–20)
CO2: 19 mmol/L — ABNORMAL LOW (ref 22–32)
Calcium: 7.7 mg/dL — ABNORMAL LOW (ref 8.9–10.3)
Chloride: 111 mmol/L (ref 98–111)
Creatinine, Ser: 0.44 mg/dL (ref 0.44–1.00)
GFR calc Af Amer: >60 mL/min (ref >60)
GFR calc non Af Amer: >60 mL/min (ref >60)
Glucose, Bld: 123 mg/dL — ABNORMAL HIGH (ref 70–99)
Phosphorus: UNDETERMINED mg/dL (ref 2.5–4.6)
Potassium: 4.3 mmol/L (ref 3.5–5.1)
Sodium: 139 mmol/L (ref 135–145)

## 2019-08-13 LAB — CBC
HCT: 29.1 % — ABNORMAL LOW (ref 36.0–46.0)
Hemoglobin: 10.4 g/dL — ABNORMAL LOW (ref 12.0–15.0)
MCH: 33.3 pg (ref 26.0–34.0)
MCHC: 35.7 g/dL (ref 30.0–36.0)
MCV: 93.3 fL (ref 80.0–100.0)
Platelets: 85 10*3/uL — ABNORMAL LOW (ref 150–400)
RBC: 3.12 MIL/uL — ABNORMAL LOW (ref 3.87–5.11)
RDW: 15.5 % (ref 11.5–15.5)
WBC: 11.8 10*3/uL — ABNORMAL HIGH (ref 4.0–10.5)
nRBC: 0 % (ref 0.0–0.2)

## 2019-08-13 LAB — BASIC METABOLIC PANEL WITH GFR
Anion gap: 12 (ref 5–15)
BUN: 12 mg/dL (ref 6–20)
CO2: 19 mmol/L — ABNORMAL LOW (ref 22–32)
Calcium: 7.5 mg/dL — ABNORMAL LOW (ref 8.9–10.3)
Chloride: 110 mmol/L (ref 98–111)
Creatinine, Ser: 0.62 mg/dL (ref 0.44–1.00)
GFR calc Af Amer: >60 mL/min
GFR calc non Af Amer: >60 mL/min
Glucose, Bld: 147 mg/dL — ABNORMAL HIGH (ref 70–99)
Potassium: 4.5 mmol/L (ref 3.5–5.1)
Sodium: 141 mmol/L (ref 135–145)

## 2019-08-13 LAB — HEPATIC FUNCTION PANEL
ALT: 84 U/L — ABNORMAL HIGH (ref 0–44)
AST: 305 U/L — ABNORMAL HIGH (ref 15–41)
Albumin: 2.5 g/dL — ABNORMAL LOW (ref 3.5–5.0)
Alkaline Phosphatase: 222 U/L — ABNORMAL HIGH (ref 38–126)
Bilirubin, Direct: 8.5 mg/dL — ABNORMAL HIGH (ref 0.0–0.2)
Indirect Bilirubin: 4.4 mg/dL — ABNORMAL HIGH (ref 0.3–0.9)
Total Bilirubin: 12.9 mg/dL — ABNORMAL HIGH (ref 0.3–1.2)
Total Protein: 6 g/dL — ABNORMAL LOW (ref 6.5–8.1)

## 2019-08-13 LAB — GLUCOSE, CAPILLARY
Glucose-Capillary: 117 mg/dL — ABNORMAL HIGH (ref 70–99)
Glucose-Capillary: 130 mg/dL — ABNORMAL HIGH (ref 70–99)
Glucose-Capillary: 137 mg/dL — ABNORMAL HIGH (ref 70–99)

## 2019-08-13 LAB — BLOOD GAS, ARTERIAL
Acid-base deficit: 7.1 mmol/L — ABNORMAL HIGH (ref 0.0–2.0)
Allens test (pass/fail): POSITIVE — AB
Bicarbonate: 15.8 mmol/L — ABNORMAL LOW (ref 20.0–28.0)
FIO2: 0.28
O2 Saturation: 99.1 %
Patient temperature: 37
pCO2 arterial: 25 mmHg — ABNORMAL LOW (ref 32.0–48.0)
pH, Arterial: 7.41 (ref 7.350–7.450)
pO2, Arterial: 136 mmHg — ABNORMAL HIGH (ref 83.0–108.0)

## 2019-08-13 LAB — TYPE AND SCREEN
ABO/RH(D): O POS
Antibody Screen: NEGATIVE

## 2019-08-13 LAB — MAGNESIUM
Magnesium: 1.5 mg/dL — ABNORMAL LOW (ref 1.7–2.4)
Magnesium: 1.5 mg/dL — ABNORMAL LOW (ref 1.7–2.4)

## 2019-08-13 LAB — PHOSPHORUS: Phosphorus: UNDETERMINED mg/dL (ref 2.5–4.6)

## 2019-08-13 LAB — MRSA PCR SCREENING: MRSA by PCR: NEGATIVE

## 2019-08-13 LAB — AMMONIA: Ammonia: 54 umol/L — ABNORMAL HIGH (ref 9–35)

## 2019-08-13 MED ORDER — SODIUM CHLORIDE 0.9% IV SOLUTION: Freq: Once | INTRAVENOUS | Status: AC

## 2019-08-13 MED ORDER — LACTULOSE ENEMA
300.0000 mL | Freq: Once | ORAL | Status: AC
  Administered 2019-08-13: 300 mL via RECTAL
  Filled 2019-08-13: qty 300

## 2019-08-13 MED ORDER — POTASSIUM CHLORIDE IN NACL 20-0.9 MEQ/L-% IV SOLN
INTRAVENOUS | Status: DC
  Filled 2019-08-13: qty 1000

## 2019-08-13 MED ORDER — CHLORHEXIDINE GLUCONATE CLOTH 2 % EX PADS
6.0000 | MEDICATED_PAD | Freq: Every day | CUTANEOUS | Status: DC
  Administered 2019-08-13 – 2019-08-19 (×5): 6 via TOPICAL

## 2019-08-13 MED ORDER — MAGNESIUM SULFATE 2 GM/50ML IV SOLN
2.0000 g | Freq: Once | INTRAVENOUS | Status: AC
  Administered 2019-08-13: 2 g via INTRAVENOUS
  Filled 2019-08-13: qty 50

## 2019-08-13 MED ORDER — SODIUM BICARBONATE 8.4 % IV SOLN
50.0000 meq | INTRAVENOUS | Status: AC
  Administered 2019-08-13 (×2): 50 meq via INTRAVENOUS
  Filled 2019-08-13 (×2): qty 50

## 2019-08-13 MED ORDER — DIAZEPAM 5 MG PO TABS: 5.0000 mg | ORAL_TABLET | Freq: Three times a day (TID) | ORAL | Status: DC | PRN

## 2019-08-13 MED ORDER — DEXTROSE 50 % IV SOLN: 12.5000 g | INTRAVENOUS | Status: DC

## 2019-08-13 MED ORDER — SODIUM BICARBONATE 8.4 % IV SOLN
100.0000 meq | Freq: Once | INTRAVENOUS | Status: AC
  Administered 2019-08-13: 100 meq via INTRAVENOUS
  Filled 2019-08-13: qty 50

## 2019-08-13 MED ORDER — LACTULOSE 10 GM/15ML PO SOLN
30.0000 g | Freq: Three times a day (TID) | ORAL | Status: DC
  Administered 2019-08-13 – 2019-08-18 (×8): 30 g via ORAL
  Filled 2019-08-13 (×8): qty 60

## 2019-08-13 MED ORDER — SODIUM CHLORIDE 0.9 % IV SOLN
2.0000 g | Freq: Three times a day (TID) | INTRAVENOUS | Status: DC
  Administered 2019-08-13 – 2019-08-16 (×9): 2 g via INTRAVENOUS
  Filled 2019-08-13 (×11): qty 2

## 2019-08-13 MED ORDER — SODIUM CHLORIDE 0.9 % IV SOLN: INTRAVENOUS | Status: DC

## 2019-08-13 MED ORDER — METRONIDAZOLE IN NACL 5-0.79 MG/ML-% IV SOLN
500.0000 mg | Freq: Three times a day (TID) | INTRAVENOUS | Status: DC
  Administered 2019-08-13 – 2019-08-17 (×12): 500 mg via INTRAVENOUS
  Filled 2019-08-13 (×14): qty 100

## 2019-08-13 MED ORDER — VANCOMYCIN HCL 750 MG/150ML IV SOLN
750.0000 mg | Freq: Two times a day (BID) | INTRAVENOUS | Status: DC
  Administered 2019-08-14 – 2019-08-15 (×3): 750 mg via INTRAVENOUS
  Filled 2019-08-13 (×6): qty 150

## 2019-08-13 MED ORDER — VANCOMYCIN HCL 1500 MG/300ML IV SOLN
1500.0000 mg | Freq: Once | INTRAVENOUS | Status: AC
  Administered 2019-08-13: 1500 mg via INTRAVENOUS
  Filled 2019-08-13: qty 300

## 2019-08-13 NOTE — Consult Note (Signed)
[place] for Electrolyte Monitoring and Replacement   Recent Labs: Potassium (mmol/L)  Date Value  08/13/2019 4.3   Magnesium (mg/dL)  Date Value  08/13/2019 1.5 (L)   Calcium (mg/dL)  Date Value  08/13/2019 7.7 (L)   Albumin (g/dL)  Date Value  08/13/2019 2.5 (L)   Phosphorus (mg/dL)  Date Value  08/13/2019 UNABLE TO REPORT DUE TO ICTERUS   Sodium (mmol/L)  Date Value  08/13/2019 139   Corrected Ca: 9.2 mg/dL  Assessment: 44 y.o.femalewith a known history of alcohol use disorder with withdrawal syndrome, CKD, anxiety/depression, hypertension, lupus, seizures presents to the emergency department for evaluation of alcohol intoxication. MIVF: 0.9% NaCl w/ 40 mEq/L at 125 mL/hr  Goal of Therapy:  Potassium 4.0 - 5.1 mmol/L Magnesium 2.0 - 2.4 mg/dL All Other Electrolytes WNL  Plan:   Change 0.9% NaCl to contain KCl 20 mEq/L at 125 mL/hr  2 grams IV magnesium sulfate x 1  Dr Dwyane Dee has ordered 2 amps of sodium bicarb  F/u electrolytes at 12pm and replace as needed  Dallie Piles ,PharmD Clinical Pharmacist 08/13/2019 7:20 AM

## 2019-08-13 NOTE — Progress Notes (Addendum)
Initial Nutrition Assessment  DOCUMENTATION CODES:   Not applicable  INTERVENTION:   If tube feeding desired:  -Osmolite 1.5 @ 20 ml/hr via NG -Increase by 10 ml Q6 hours to goal rate of 50 ml/hr (1200 ml) -30 ml Prostat BID  At goal rate TF provides: 2000 kcals, 105 grams protein, 914 ml free water.   Monitor magnesium, potassium, and phosphorus daily for at least 3 days, MD to replete as needed, as pt is at risk for refeeding syndrome.   NUTRITION DIAGNOSIS:   Inadequate oral intake related to lethargy/confusion as evidenced by meal completion < 25%.  GOAL:   Patient will meet greater than or equal to 90% of their needs  MONITOR:   PO intake, Supplement acceptance, Labs, Weight trends, I & O's  REASON FOR ASSESSMENT:   Consult Enteral/tube feeding initiation and management  ASSESSMENT:   Patient with PMH significant for ETOH abuse, alcoholic cirrhosis, esophogeal/gastric varices, SLE, CVA, HTN, seizures, and CKD. Presents this admission after a mechanical fall. Found to alcoholic liver disease with intoxication and upper UGI.   RD working remotely.  Spoke with RN via phone. No plans to start TF as pt was just made DNR/DNI with no escalation of care. Is currently on a diet but not taking anything PO due to mental status. Unable to obtain nutrition/eight history PTA. Will leave recommendations if TF desired in the future.   Xray confirmed NGT located in stomach.   Records are limited in weight history. Utilize EDW of 57.6 kg for now.   Drips: NS @ 125 ml/hr, Mg sulfate  Medications: folic acid, lactulose, MVI with minerals, sodium bicarb, thiamine Labs: Mg 1.5 (L) LFTs elevated   Diet Order:   Diet Order    None      EDUCATION NEEDS:   Not appropriate for education at this time  Skin:  Skin Assessment: Reviewed RN Assessment  Last BM:  6/12  Height:   Ht Readings from Last 1 Encounters:  08/10/19 5\' 2"  (1.575 m)    Weight:   Wt Readings from  Last 1 Encounters:  08/10/19 61.8 kg    BMI:  Body mass index is 24.91 kg/m.  Estimated Nutritional Needs:   Kcal:  1800-2000 kcal  Protein:  90-105 grams  Fluid:  >/= 1.8 L/day   Mariana Single RD, LDN Clinical Nutrition Pager listed in [place]

## 2019-08-13 NOTE — Consult Note (Signed)
Pharmacy Antibiotic Note  Chloe [name] is a 44 y.o. female with history of alcohol use disorder, liver disease admitted on 08/09/2019 with altered mental status. Concern for upper GIB with recent hematemesis with plan for EGD. However, patient became febrile and procedure was cancelled. Source of infection was thought to be urine which is growing pan-sensitive E coli. Patient became acutely worse this afternoon with tachypnea, increased abdominal girth and imaging concerning for volvulus. Patient transferred from floor to ICU. Patient received CTX 6/8-6/11 which has now been broadened to vancomycin, cefepime, and metronidazole. Pharmacy has been consulted for vancomycin dosing.  Plan: Vancomycin 1500 mg LD x 1 followed by  Vancomycin 750 mg IV Q 12 hrs. Goal AUC 400-550. Expected AUC: 527.4, trough 15.6 SCr used: 0.8  Daily Scr per protocol.   Height: 5\' 2"  (157.5 cm) Weight: 61.8 kg (136 lb 3.2 oz) IBW/kg (Calculated) : 50.1  Temp (24hrs), Avg:98.6 F (37 C), Min:97.8 F (36.6 C), Max:100 F (37.8 C)  Recent Labs  Lab 08/10/19 0555 08/10/19 1114 08/10/19 1223 08/10/19 2125 08/11/19 0408 08/12/19 0538 08/13/19 0607 08/13/19 1202  WBC 5.5  --  5.6 5.0  --  6.9 11.8*  --   CREATININE 0.98  --   --   --  0.71 0.55 0.44 0.62  LATICACIDVEN 1.2 1.1  --   --   --   --   --   --     Estimated Creatinine Clearance: 78.4 mL/min (by C-G formula based on SCr of 0.62 mg/dL).    Allergies  Allergen Reactions  . Bee Venom     Shortness of breath  . Sumatriptan Anaphylaxis    Antimicrobials this admission: Ceftriaxone 6/8 >> 6/11 Cefepime 6/12 >>  Metronidazole 6/12 >>  Vancomycin 6/12 >>   Dose adjustments this admission: n/a  Microbiology results: 6/8 UCx: pan-sensitive E coli 6/12 MRSA PCR: negative  Thank you for allowing pharmacy to be a part of this patient's care.  [place] Resident 08/13/2019 3:31 PM

## 2019-08-13 NOTE — Progress Notes (Signed)
PROGRESS NOTE    Chloe [name]  JFH:[id number] DOB: [birth year] DOA: 08/09/2019 PCP: [facility]   Brief Narrative:  Patient was brought in from home a by EMS after she was found fallen down on the floor by her father. Patient has history of chronic alcoholism with significant liver disorder. She reports drinking a glass of wine and a glass of vodka on a daily basis. She has been vomiting bright red blood mixed with coffee ground emesis few days ago. Hasn't vomited lately.   Developed high-grade fever with tachycardia.  Started on Rocephin for E. coli UTI.  EGD over the weekend.  Subjective: Since morning patient was very lethargic and was tachypneic and tachycardic.  Patient was not arousable and unable to offer any complaints. Patient was in a critical condition, rapid response nurse was called and pt was transferred to ICU   Assessment & Plan:   Active Problems:   Upper GI bleed   Lower urinary tract infectious disease   Acute liver failure without hepatic coma  Alcohol use disorder with intoxication, alcoholic liver disease MELDscore of 29 with electrolyte derangement -Maddery score 34 -GI consultation with Dr Estil Daft on hold due to high grade fever--plan was to get it done over the weekend -Correct electrolytes -Monitor LFTs and INR -Continue Lasix and spironolactone -CIWA protocol with banana bag -Seizure and fall precautions -Social work consultation for discussion of detox and rehabilitation options   UpperGI bleed- H&H stable -- patient likely has variceal bleed given history of alcoholic cirrhosis of liver. -s/p IV Protonix and octreotide drip- for total of 72 hours. -Serial CBCs--stable - G.I. consultation appreciated. Pt will get EGD over the weekend -Holding anticoagulants  Acquired coagulopathy secondary to liver cirrhosis -low platelet count, elevated PT/INR  Sepsis due to UTI/acute pyelonephritis as noted on CT abdomen--POA -IV  Rocephin -UC-pansensitive ecoli -Continue Rocephin to complete a 7-day course, if she becomes stable for discharge after EGD, she can complete the course p.o. with Keflex  History of depression/anxiety -Continue Cymbalta  History of hypothyroidism -Continue Synthroid   6/11 patient was in metabolic acidosis, tachypneic and tachycardic with abdominal distention ABG was consistent with metabolic acidosis Patient was given 2 Amps of bicarb IV push, NG tube was inserted for feeding and medications as patient was not able to take anything p.o. About treatment continued Abdominal x-ray was ordered by critical care which showed abdominal distention and possibility of cecal volvulus and recommended CT scan of abdomen which I informed to critical care attending Rapid response RN was called and patient was transferred to ICU/critical care for further management. CODE STATUS was changed in the ICU to DNR due to poor prognosis  Hospitalist team will sign off, patient transferred under ICU attending     Objective: Vitals:   08/13/19 0830 08/13/19 0947 08/13/19 1051 08/13/19 1300  BP:  (!) 101/50 (!) 94/41 106/68  Pulse:  (!) 101 (!) 103 (!) 43  Resp: (!) 38 (!) 36 (!) 46 (!) 33  Temp:  98.8 F (37.1 C) 98.6 F (37 C)   TempSrc:  Oral Oral   SpO2:  97% 97% 96%  Weight:      Height:        Intake/Output Summary (Last 24 hours) at 08/13/2019 1446 Last data filed at 08/13/2019 1012 Gross per 24 hour  Intake 3812.7 ml  Output 700 ml  Net 3112.7 ml   Filed Weights   08/09/19 1618 08/10/19 2127  Weight: 57.6 kg 61.8 kg  Examination:  General exam: Very lethargic, unarousable Respiratory system: Acidotic breathing, tachypneic  Cardiovascular system: S1 & S2 heard, RRR. No JVD, murmurs, rubs, gallops or clicks. Gastrointestinal system:  Firm and distended due to ascites, bowel sounds positive. Central nervous system: Lethargic, unarousable and unable to follow any commands   Extremities: No edema, no cyanosis, pulses intact and symmetrical, 2+ edema Psychiatry: Unable to assess as patient is very lethargic.    DVT prophylaxis: SCDs. Code Status: DNR Family Communication: None available at bedside  Disposition Plan:  Status is: Inpatient  Remains inpatient appropriate because:Inpatient level of care appropriate due to severity of illness   Dispo: The patient is from: Home              Anticipated d/c is to: Home              Anticipated d/c date is: > 3 days              Patient currently is not medically stable to d/c.  Consultants:   GI  Critical care  Procedures:  Antimicrobials:  Rocephin  Data Reviewed: I have personally reviewed following labs and imaging studies  CBC: Recent Labs  Lab 08/10/19 0555 08/10/19 1223 08/10/19 2125 08/12/19 0538 08/13/19 0607  WBC 5.5 5.6 5.0 6.9 11.8*  HGB 10.0* 9.6* 10.0* 9.9* 10.4*  HCT 28.3* 26.9* 27.9* 27.9* 29.1*  MCV 96.6 95.4 95.9 95.5 93.3  PLT 23* 26* 30* 42* 85*   Basic Metabolic Panel: Recent Labs  Lab 08/09/19 1624 08/09/19 2057 08/10/19 0555 08/11/19 0408 08/12/19 0538 08/13/19 0607 08/13/19 1202  NA   < >  --  129* 131* 134* 139 141  K   < >  --  2.8* 3.3* 3.5 4.3 4.5  CL   < >  --  94* 101 104 111 110  CO2   < >  --  22 22 21* 19* 19*  GLUCOSE   < >  --  76 156* 183* 123* 147*  BUN   < >  --  24* 15 11 9 12   CREATININE   < >  --  0.98 0.71 0.55 0.44 0.62  CALCIUM   < >  --  7.0* 7.6* 7.9* 7.7* 7.5*  MG   < > 0.9* 1.8 1.3* 2.1 1.5* 1.5*  PHOS  --  2.6  --   --  <1.0* UNABLE TO REPORT DUE TO ICTERUS UNABLE TO REPORT DUE TO ICTERUS   < > = values in this interval not displayed.   GFR: Estimated Creatinine Clearance: 78.4 mL/min (by C-G formula based on SCr of 0.62 mg/dL). Liver Function Tests: Recent Labs  Lab 08/09/19 1624 08/09/19 1624 08/11/19 0408 08/12/19 0538 08/12/19 1853 08/13/19 0607 08/13/19 1202  AST 314*  --  189*  --  203*  --  305*  ALT 76*  --   56*  --  68*  --  84*  ALKPHOS 117  --  111  --  131*  --  222*  BILITOT 11.7*  --  7.9*  --  11.6*  --  12.9*  PROT 7.7  --  5.8*  --  6.1*  --  6.0*  ALBUMIN 3.8   < > 2.4* 2.4* 2.5* 2.5* 2.5*   < > = values in this interval not displayed.   No results for input(s): LIPASE, AMYLASE in the last 168 hours. Recent Labs  Lab 08/09/19 2057 08/12/19 1853 08/13/19 0813  AMMONIA 31 45* 54*  Coagulation Profile: Recent Labs  Lab 08/09/19 1624 08/10/19 0612 08/12/19 1853  INR 1.8* 1.8* 1.9*   Cardiac Enzymes: No results for input(s): CKTOTAL, CKMB, CKMBINDEX, TROPONINI in the last 168 hours. BNP (last 3 results) No results for input(s): PROBNP in the last 8760 hours. HbA1C: No results for input(s): HGBA1C in the last 72 hours. CBG: Recent Labs  Lab 08/13/19 1152 08/13/19 1218  GLUCAP 130* 137*   Lipid Profile: No results for input(s): CHOL, HDL, LDLCALC, TRIG, CHOLHDL, LDLDIRECT in the last 72 hours. Thyroid Function Tests: No results for input(s): TSH, T4TOTAL, FREET4, T3FREE, THYROIDAB in the last 72 hours. Anemia Panel: No results for input(s): VITAMINB12, FOLATE, FERRITIN, TIBC, IRON, RETICCTPCT in the last 72 hours. Sepsis Labs: Recent Labs  Lab 08/10/19 0555 08/10/19 1114 08/11/19 0408 08/12/19 0538  PROCALCITON 4.18  --  2.78 1.98  LATICACIDVEN 1.2 1.1  --   --     Recent Results (from the past 240 hour(s))  Urine Culture     Status: Abnormal   Collection Time: 08/09/19  5:27 PM   Specimen: Urine, Random  Result Value Ref Range Status   Specimen Description   Final    URINE, RANDOM Performed at [facility], [address] [place], [place] [address]    Special Requests   Final    NONE Performed at [facility], [address] [place], [place] [address]    Culture >=100,000 COLONIES/mL ESCHERICHIA COLI (A)  Final   Report Status 08/11/2019 FINAL  Final   Organism ID, Bacteria ESCHERICHIA COLI (A)  Final      Susceptibility    Escherichia coli - MIC*    AMPICILLIN <=2 SENSITIVE Sensitive     CEFAZOLIN <=4 SENSITIVE Sensitive     CEFTRIAXONE <=1 SENSITIVE Sensitive     CIPROFLOXACIN <=0.25 SENSITIVE Sensitive     GENTAMICIN <=1 SENSITIVE Sensitive     IMIPENEM <=0.25 SENSITIVE Sensitive     NITROFURANTOIN <=16 SENSITIVE Sensitive     TRIMETH/SULFA <=20 SENSITIVE Sensitive     AMPICILLIN/SULBACTAM <=2 SENSITIVE Sensitive     PIP/TAZO <=4 SENSITIVE Sensitive     * >=100,000 COLONIES/mL ESCHERICHIA COLI  SARS Coronavirus 2 by RT PCR (hospital order, performed in [place] hospital lab) Nasopharyngeal Nasopharyngeal Swab     Status: None   Collection Time: 08/09/19  7:30 PM   Specimen: Nasopharyngeal Swab  Result Value Ref Range Status   SARS Coronavirus 2 NEGATIVE NEGATIVE Final    Comment: (NOTE) SARS-CoV-2 target nucleic acids are NOT DETECTED. The SARS-CoV-2 RNA is generally detectable in upper and lower respiratory specimens during the acute phase of infection. The lowest concentration of SARS-CoV-2 viral copies this assay can detect is 250 copies / mL. A negative result does not preclude SARS-CoV-2 infection and should not be used as the sole basis for treatment or other patient management decisions.  A negative result may occur with improper specimen collection / handling, submission of specimen other than nasopharyngeal swab, presence of viral mutation(s) within the areas targeted by this assay, and inadequate number of viral copies (<250 copies / mL). A negative result must be combined with clinical observations, patient history, and epidemiological information. Fact Sheet for Patients:   StrictlyIdeas.no Fact Sheet for Healthcare Providers: BankingDealers.co.za This test is not yet approved or cleared  by the [place] FDA and has been authorized for detection and/or diagnosis of SARS-CoV-2 by FDA under an Emergency Use Authorization (EUA).  This  EUA will remain in  effect (meaning this test can be used) for the duration of the COVID-19 declaration under Section 564(b)(1) of the Act, 21 U.S.C. section 360bbb-3(b)(1), unless the authorization is terminated or revoked sooner. Performed at [facility], [address] [place], [place] [address]      Radiology Studies: DG Abd 1 View  Result Date: 08/13/2019 CLINICAL DATA:  Nasogastric tube placement EXAM: ABDOMEN - 1 VIEW COMPARISON:  CT abdomen and pelvis August 10, 2019 FINDINGS: Nasogastric tube tip and side port are in the stomach. Loops of bowel dilatation remain. Note that the cecum is now distended and oriented perpendicular to the lumbar spine, a change from recent CT. No free air. IMPRESSION: Nasogastric tube tip and side port in stomach. Persistent bowel dilatation. Note that there is now dilatation of the cecum with cecum now perpendicular to the lumbar spine. Question early cecal volvulus. This finding may warrant correlation with abdominal CT to further evaluate. No free air. These results will be called to the ordering clinician or representative by the Radiologist Assistant, and communication documented in the PACS or [employer]. Electronically Signed   By: Lowella Grip III M.D.   On: 08/13/2019 13:41    Scheduled Meds:  Chlorhexidine Gluconate Cloth  6 each Topical Daily   DULoxetine  20 mg Oral Daily   folic acid  1 mg Oral Daily   lactulose  30 g Oral TID   levothyroxine  75 mcg Oral Q0600   multivitamin with minerals  1 tablet Oral Daily   pentoxifylline  400 mg Oral TID WC   sodium bicarbonate  100 mEq Intravenous Once   thiamine  100 mg Oral Daily   Or   thiamine  100 mg Intravenous Daily   Continuous Infusions:  sodium chloride     cefTRIAXone (ROCEPHIN)  IV Stopped (08/12/19 1639)   magnesium sulfate bolus IVPB       LOS: 4 days   Time spent: 40 minutes.  Val Riles, MD [facility]  If 7PM-7AM, please contact  night-coverage [email or web address]  08/13/2019, 2:46 PM   This record has been created using Systems analyst. Errors have been sought and corrected,but may not always be located. Such creation errors do not reflect on the standard of care.

## 2019-08-13 NOTE — H&P (Signed)
Name: Chloe [name] MRN: [id number] DOB: 23-Feb-1976     CONSULTATION DATE: 08/09/2019  REFERRING MD :  Dr. Reesa Chew  CHIEF COMPLAINT:  Acute Hypoxic Respiratory Failure  HISTORY OF PRESENT ILLNESS:  44yo  WF hx of severe ethanol abuse presented via EMS to the ED after she perhaps fell while intoxicated. The ED physician suspected that the patient's father activated EMS for help due to her severe alcoholism and frequent inebriation. Unfortunately, despite a sobering admission to [facility] in February with news of cirrhosis and kidney disease, the patient has again started drinking heavily, and admitted to such in the ED. The patient has been jaundiced and icteric for several weeks to months. Her abdominal girth has been expanding, and she has been intermittently been having hematemesis. Once an inpatient, GI had planned for EGD, however the patient developed a fever to 103 and the procedure was cancelled. The source of the fever was surmised urine, and she was placed on Rocephin for e. coli.   This afternoon, the patient developed increasing tachypnea, along with increased abdominal girth, and was moved to the ICU pending imminent intubation. The patient was stuporous to obtunded, and did vomit prior to coming to the unit and perhaps aspirated. An AXR was done to verify the NGT, and this revealed and increased and misplaced cecum suspicious for volvulus. A CT 08/10/19 did mention the cecum was mobile and placed atypically.   PAST MEDICAL HISTORY :   has a past medical history of Alcohol use disorder, Alcohol withdrawal delirium ([place]), Anxiety, Cancer ([place]) (2015), Chronic kidney disease, Depression, Hypertension, Lupus ([place]), Raynaud's phenomenon, Seizures ([place]), and Steatosis of liver.  has a past surgical history that includes Breast enhancement surgery and ORIF tibia fracture (Right, 12/28/2015). Prior to Admission medications   Medication Sig Start Date End Date Taking? Authorizing Provider  DULoxetine  (CYMBALTA) 20 MG capsule Take 20 mg by mouth daily. 05/30/19  Yes [provider]  folic acid (FOLVITE) 1 MG tablet Take 1 tablet (1 mg total) by mouth daily. 12/03/15  Yes Hower, Aaron Mose, MD  furosemide (LASIX) 40 MG tablet Take 40 mg by mouth daily.    Yes [provider]  gabapentin (NEURONTIN) 300 MG capsule Take 300 mg by mouth 3 (three) times daily.   Yes [provider]  levothyroxine (SYNTHROID) 75 MCG tablet Take 75 mcg by mouth daily. 05/25/19  Yes [provider]  ondansetron (ZOFRAN) 4 MG tablet Take 4 mg by mouth every 8 (eight) hours as needed for nausea/vomiting. 08/05/19  Yes [provider]  pantoprazole (PROTONIX) 40 MG tablet Take 40 mg by mouth daily. 08/08/19  Yes [provider]  prenatal vitamin w/FE, FA (PRENATAL 1 + 1) 27-1 MG TABS tablet Take 1 tablet by mouth daily at 12 noon.   Yes [provider]  spironolactone (ALDACTONE) 25 MG tablet Take 25 mg by mouth 2 (two) times daily. 06/03/19  Yes [provider]   Allergies  Allergen Reactions  . Bee Venom     Shortness of breath  . Sumatriptan Anaphylaxis    FAMILY HISTORY:  family history includes Alzheimer's disease in her maternal grandfather; Diabetes in her paternal grandmother. SOCIAL HISTORY:  reports that she has never smoked. She has never used smokeless tobacco. She reports current alcohol use. She reports that she does not use drugs.  REVIEW OF SYSTEMS:   Unable to obtain due to critical illness      Estimated body mass index is 24.91 kg/m  as calculated from the following:   Height as of this encounter: 5\' 2"  (1.575 m).   Weight as of this encounter: 61.8 kg.    VITAL SIGNS: Temp:  [97.8 F (36.6 C)-100 F (37.8 C)] 98.6 F (37 C) (06/12 1051) Pulse Rate:  [43-105] 43 (06/12 1300) Resp:  [16-58] 33 (06/12 1300) BP: (94-131)/(41-89) 106/68 (06/12 1300) SpO2:  [95 %-100 %] 96 % (06/12 1300)   I/O last 3 completed shifts: In:  5597.1 [P.O.:720; I.V.:4407.6; IV Piggyback:469.5] Out: 2000 [Urine:2000] No intake/output data recorded.   SpO2: 96 % O2 Flow Rate (L/min): 2 L/min   Physical Examination:  GENERAL:critically ill appearing, +resp distress HEAD: Normocephalic, atraumatic.  EYES: Pupils equal, round, reactive to light.  icteric.  MOUTH: dry mucosal membrane. NECK: Supple. No JVD.  PULMONARY: few rhonchi CARDIOVASCULAR: S1 and S2. Regular rate and rhythm. No murmurs, rubs, or gallops.  GASTROINTESTINAL: distended, mild fluid wave, does not seem to be guarding, diminished bowel sounds.  MUSCULOSKELETAL: No swelling, clubbing, or edema.  NEUROLOGIC: obtunded SKIN:intact,cool,dry, obviously jaundice   I personally reviewed lab work that was obtained in last 24 hrs. CXR Independently reviewed  MEDICATIONS: I have reviewed all medications and confirmed regimen as documented   CULTURE RESULTS   Recent Results (from the past 240 hour(s))  Urine Culture     Status: Abnormal   Collection Time: 08/09/19  5:27 PM   Specimen: Urine, Random  Result Value Ref Range Status   Specimen Description   Final    URINE, RANDOM Performed at [facility], [address] [place], [place] [address]    Special Requests   Final    NONE Performed at [facility], [address] [place],  [address]    Culture >=100,000 COLONIES/mL ESCHERICHIA COLI (A)  Final   Report Status 08/11/2019 FINAL  Final   Organism ID, Bacteria ESCHERICHIA COLI (A)  Final      Susceptibility   Escherichia coli - MIC*    AMPICILLIN <=2 SENSITIVE Sensitive     CEFAZOLIN <=4 SENSITIVE Sensitive     CEFTRIAXONE <=1 SENSITIVE Sensitive     CIPROFLOXACIN <=0.25 SENSITIVE Sensitive     GENTAMICIN <=1 SENSITIVE Sensitive     IMIPENEM <=0.25 SENSITIVE Sensitive     NITROFURANTOIN <=16 SENSITIVE Sensitive     TRIMETH/SULFA <=20 SENSITIVE Sensitive     AMPICILLIN/SULBACTAM <=2 SENSITIVE Sensitive     PIP/TAZO <=4  SENSITIVE Sensitive     * >=100,000 COLONIES/mL ESCHERICHIA COLI  SARS Coronavirus 2 by RT PCR (hospital order, performed in [place] hospital lab) Nasopharyngeal Nasopharyngeal Swab     Status: None   Collection Time: 08/09/19  7:30 PM   Specimen: Nasopharyngeal Swab  Result Value Ref Range Status   SARS Coronavirus 2 NEGATIVE NEGATIVE Final    Comment: (NOTE) SARS-CoV-2 target nucleic acids are NOT DETECTED. The SARS-CoV-2 RNA is generally detectable in upper and lower respiratory specimens during the acute phase of infection. The lowest concentration of SARS-CoV-2 viral copies this assay can detect is 250 copies / mL. A negative result does not preclude SARS-CoV-2 infection and should not be used as the sole basis for treatment or other patient management decisions.  A negative result may occur with improper specimen collection / handling, submission of specimen other than nasopharyngeal swab, presence of viral mutation(s) within the areas targeted by this assay, and inadequate number of viral copies (<250 copies / mL). A negative result must be combined with clinical observations, patient  history, and epidemiological information. Fact Sheet for Patients:   StrictlyIdeas.no Fact Sheet for Healthcare Providers: BankingDealers.co.za This test is not yet approved or cleared  by the [place] FDA and has been authorized for detection and/or diagnosis of SARS-CoV-2 by FDA under an Emergency Use Authorization (EUA).  This EUA will remain in effect (meaning this test can be used) for the duration of the COVID-19 declaration under Section 564(b)(1) of the Act, 21 U.S.C. section 360bbb-3(b)(1), unless the authorization is terminated or revoked sooner. Performed at [facility], [address] [place], [place] [address]           IMAGING    DG Abd 1 View  Result Date: 08/13/2019 CLINICAL DATA:  Nasogastric tube  placement EXAM: ABDOMEN - 1 VIEW COMPARISON:  CT abdomen and pelvis August 10, 2019 FINDINGS: Nasogastric tube tip and side port are in the stomach. Loops of bowel dilatation remain. Note that the cecum is now distended and oriented perpendicular to the lumbar spine, a change from recent CT. No free air. IMPRESSION: Nasogastric tube tip and side port in stomach. Persistent bowel dilatation. Note that there is now dilatation of the cecum with cecum now perpendicular to the lumbar spine. Question early cecal volvulus. This finding may warrant correlation with abdominal CT to further evaluate. No free air. These results will be called to the ordering clinician or representative by the Radiologist Assistant, and communication documented in the PACS or [employer]. Electronically Signed   By: Lowella Grip III M.D.   On: 08/13/2019 13:41     Nutrition Status: Nutrition Problem: Inadequate oral intake Etiology: lethargy/confusion Signs/Symptoms: meal completion < 25% Interventions: Refer to RD note for recommendations     Indwelling Urinary Catheter continued, requirement due to   Reason to continue Indwelling Urinary Catheter strict Intake/Output monitoring for hemodynamic instability    ASSESSMENT AND PLAN SYNOPSIS  Severe ACUTE Hypoxic and Hypercapnic Respiratory Failure -the patient likely aspirated -NGT removal of a liter of dark fluid -removal of this volume did improve her respirations -she would be significant risk for intubation, however her family wishes she be DNR/DNI -altered MS from ammonia and likely Ativan administration  -bicarb given to mitigate tachypnea related to metabolic acidosis on ABG   NEUROLOGY Acute toxic metabolic encephalopathy  -altered -mildly elevated ammonia -benzo administration   SHOCK-SEPSIS/HYPOVOLUMIC/CARDIOGENIC -use vasopressors to keep MAP>65 -follow ABG and LA -follow up cultures -emperic ABX -consider stress dose  steroids -aggressive IV fluid resuscitation  CARDIAC ICU monitoring  ID -Rocephin started -Broaden for now to cefepime, vanc, flagyl -Paracentesis ordered, send fluid for studies and culture  GI Hepatic failure UGIB Cecal Volvulus?  MELD 30, 50% 75m mortality for those with similar pattern of lab findings Synthetic function decline with elevated INR and depressed albumin. Bilirubin 12.9 today Nodular liver associated with significant cirrhosis Supportive care, fend off hepatic encephalopathy and continue lactulose  Replete/Replace electrolytes and FFP if there is bleeding with INR sustained >1.7  GI has deferred EGD for now Maintain protonix and octreotide at this time May require FFP Transfusion trigger HgB 7, presently stable around 10  The patient is DNR, and family does not wish surgical intervention even if this is a true volvulus Will not pursue further workup at this time as she is at risk for CT without an airway    NUTRITIONAL STATUS NPO at the moment   ENDO - will use ICU hypoglycemic\Hyperglycemia protocol if needed   ELECTROLYTES -follow labs as needed -replace as  needed -pharmacy consultation and following    DVT/GI PRX ordered and assessed TRANSFUSIONS AS NEEDED MONITOR FSBS I Assessed the need for Labs I Assessed the need for Foley I Assessed the need for Central Venous Line Family Discussion when available I Assessed the need for Mobilization I made an Assessment of medications to be adjusted accordingly Safety Risk assessment Completed   Overall, patient is critically ill, prognosis is guarded.  Patient with Multiorgan failure and at high risk for cardiac arrest and death.

## 2019-08-13 NOTE — Consult Note (Addendum)
[facility] for Electrolyte Monitoring and Replacement   Recent Labs: Potassium (mmol/L)  Date Value  08/13/2019 4.5   Magnesium (mg/dL)  Date Value  08/13/2019 1.5 (L)   Calcium (mg/dL)  Date Value  08/13/2019 7.5 (L)   Albumin (g/dL)  Date Value  08/13/2019 2.5 (L)   Phosphorus (mg/dL)  Date Value  08/13/2019 UNABLE TO REPORT DUE TO ICTERUS   Sodium (mmol/L)  Date Value  08/13/2019 141   Corrected Ca: 9.2 mg/dL  Assessment: 44 y.o.femalewith a known history of alcohol use disorder with withdrawal syndrome, CKD, anxiety/depression, hypertension, lupus, seizures presents to the emergency department for evaluation of alcohol intoxication. MIVF: 0.9% NaCl w/ 20 mEq/L at 125 mL/hr  Goal of Therapy:  Potassium 4.0 - 5.1 mmol/L Magnesium 2.0 - 2.4 mg/dL All Other Electrolytes WNL  Plan:   Remove KCl from IV fluids   Repeat 2 grams IV magnesium sulfate x 1  Dr Dwyane Dee has ordered an additional 2 amps of sodium bicarb  F/u electrolytes at 6/13 am and replace as needed  Dallie Piles ,PharmD Clinical Pharmacist 08/13/2019 1:20 PM

## 2019-08-13 NOTE — Progress Notes (Addendum)
Chloe Antigua, MD [address], [place], [place], [place], [address] [address], [place], [place], [place], [address] Phone: [phone]  Fax: [phone]   Subjective: Pt lethargic in bed this morning. Paracentesis ordered by Dr. Ricky Stabs team yesterday still pending   Objective: Exam: Vital signs in last 24 hours: Vitals:   08/13/19 0830 08/13/19 0947 08/13/19 1051 08/13/19 1300  BP:  (!) 101/50 (!) 94/41 106/68  Pulse:  (!) 101 (!) 103 (!) 43  Resp: (!) 38 (!) 36 (!) 46 (!) 33  Temp:  98.8 F (37.1 C) 98.6 F (37 C)   TempSrc:  Oral Oral   SpO2:  97% 97% 96%  Weight:      Height:       Weight change:   Intake/Output Summary (Last 24 hours) at 08/13/2019 1623 Last data filed at 08/13/2019 1012 Gross per 24 hour  Intake 1814.32 ml  Output 700 ml  Net 1114.32 ml    General: lethargic Abd: Soft, distended abdomen, No HSM Skin: Warm, no rashes Neck: Supple, Trachea midline   Lab Results: Lab Results  Component Value Date   WBC 11.8 (H) 08/13/2019   HGB 10.4 (L) 08/13/2019   HCT 29.1 (L) 08/13/2019   MCV 93.3 08/13/2019   PLT 85 (L) 08/13/2019   Micro Results: Recent Results (from the past 240 hour(s))  Urine Culture     Status: Abnormal   Collection Time: 08/09/19  5:27 PM   Specimen: Urine, Random  Result Value Ref Range Status   Specimen Description   Final    URINE, RANDOM Performed at [facility], [address] [place], [place] [address]    Special Requests   Final    NONE Performed at [facility], [address] [place], [place] [address]    Culture >=100,000 COLONIES/mL ESCHERICHIA COLI (A)  Final   Report Status 08/11/2019 FINAL  Final   Organism ID, Bacteria ESCHERICHIA COLI (A)  Final      Susceptibility   Escherichia coli - MIC*    AMPICILLIN <=2 SENSITIVE Sensitive     CEFAZOLIN <=4 SENSITIVE Sensitive     CEFTRIAXONE <=1 SENSITIVE Sensitive     CIPROFLOXACIN <=0.25 SENSITIVE Sensitive      GENTAMICIN <=1 SENSITIVE Sensitive     IMIPENEM <=0.25 SENSITIVE Sensitive     NITROFURANTOIN <=16 SENSITIVE Sensitive     TRIMETH/SULFA <=20 SENSITIVE Sensitive     AMPICILLIN/SULBACTAM <=2 SENSITIVE Sensitive     PIP/TAZO <=4 SENSITIVE Sensitive     * >=100,000 COLONIES/mL ESCHERICHIA COLI  SARS Coronavirus 2 by RT PCR (hospital order, performed in [place] hospital lab) Nasopharyngeal Nasopharyngeal Swab     Status: None   Collection Time: 08/09/19  7:30 PM   Specimen: Nasopharyngeal Swab  Result Value Ref Range Status   SARS Coronavirus 2 NEGATIVE NEGATIVE Final    Comment: (NOTE) SARS-CoV-2 target nucleic acids are NOT DETECTED. The SARS-CoV-2 RNA is generally detectable in upper and lower respiratory specimens during the acute phase of infection. The lowest concentration of SARS-CoV-2 viral copies this assay can detect is 250 copies / mL. A negative result does not preclude SARS-CoV-2 infection and should not be used as the sole basis for treatment or other patient management decisions.  A negative result may occur with improper specimen collection / handling, submission of specimen other than nasopharyngeal swab, presence of viral mutation(s) within the areas targeted by this assay, and inadequate number of viral copies (<250 copies / mL).  A negative result must be combined with clinical observations, patient history, and epidemiological information. Fact Sheet for Patients:   StrictlyIdeas.no Fact Sheet for Healthcare Providers: BankingDealers.co.za This test is not yet approved or cleared  by the [place] FDA and has been authorized for detection and/or diagnosis of SARS-CoV-2 by FDA under an Emergency Use Authorization (EUA).  This EUA will remain in effect (meaning this test can be used) for the duration of the COVID-19 declaration under Section 564(b)(1) of the Act, 21 U.S.C. section 360bbb-3(b)(1), unless the  authorization is terminated or revoked sooner. Performed at [facility], [address] [place], [place] [address]   MRSA PCR Screening     Status: None   Collection Time: 08/13/19  1:09 PM   Specimen: Nasopharyngeal  Result Value Ref Range Status   MRSA by PCR NEGATIVE NEGATIVE Final    Comment:        The GeneXpert MRSA Assay (FDA approved for NASAL specimens only), is one component of a comprehensive MRSA colonization surveillance program. It is not intended to diagnose MRSA infection nor to guide or monitor treatment for MRSA infections. Performed at [facility], [address] [place], [place] [address]    Studies/Results: [place] Abd 1 View  Result Date: 08/13/2019 CLINICAL DATA:  Nasogastric tube placement EXAM: ABDOMEN - 1 VIEW COMPARISON:  CT abdomen and pelvis August 10, 2019 FINDINGS: Nasogastric tube tip and side port are in the stomach. Loops of bowel dilatation remain. Note that the cecum is now distended and oriented perpendicular to the lumbar spine, a change from recent CT. No free air. IMPRESSION: Nasogastric tube tip and side port in stomach. Persistent bowel dilatation. Note that there is now dilatation of the cecum with cecum now perpendicular to the lumbar spine. Question early cecal volvulus. This finding may warrant correlation with abdominal CT to further evaluate. No free air. These results will be called to the ordering clinician or representative by the Radiologist Assistant, and communication documented in the PACS or [employer]. Electronically Signed   By: Lowella Grip III M.D.   On: 08/13/2019 13:41   Medications:  Scheduled Meds: . sodium chloride   Intravenous Once  . Chlorhexidine Gluconate Cloth  6 each Topical Daily  . folic acid  1 mg Oral Daily  . lactulose  30 g Oral TID  . levothyroxine  75 mcg Oral Q0600  . multivitamin with minerals  1 tablet Oral Daily  . pentoxifylline  400 mg Oral TID WC  . sodium bicarbonate   100 mEq Intravenous Once  . thiamine  100 mg Oral Daily   Or  . thiamine  100 mg Intravenous Daily   Continuous Infusions: . sodium chloride    . ceFEPime (MAXIPIME) IV    . magnesium sulfate bolus IVPB    . metronidazole    . vancomycin    . [START ON 08/14/2019] vancomycin     PRN Meds:.albuterol, diazepam, ipratropium, ondansetron **OR** ondansetron (ZOFRAN) IV   Assessment: Active Problems:   Upper GI bleed   Lower urinary tract infectious disease   Acute liver failure without hepatic coma   Acute respiratory failure with hypoxia (HCC)   Metabolic acidemia    Plan: Pt was signed out to me for the weekend by Dr. Ricky Stabs team who was following the patient over the week.   As per their sign out, suspicion for variceal bleed has been low  No active GI bleeding at this time  Pt has a  distended abdomen and paracentesis is pending.   Would recommend stat CT to evaluate for cecal volvulus. If present, pt would need surgical management. As per [employer]: "The management for patients with a cecal volvulus is primarily surgical. Nonoperative reduction of cecal volvulus (eg, by colonoscopy or barium enema) is rarely successful (<5 percent) and could cause perforation; it therefore should not be attempted"  Recommend surgery consult at this time as well  Agree with IV protonix and Octreotide  In the setting of ongoing acute issues (active infection, hepatitis etc) and no active GI bleeding, stable hemoglobin, endoscopic procedures would be higher risks than benefits at this time  Liver enzymes remain elevated on pentoxyfylline  Continue daily CMP. Avoid hepatotoxic drugs  Above discussed with ICU attending Dr. Merrilee Jansky who states family is leaning toward comfort care and declining procedures including paracentesis   LOS: 4 days   Chloe Antigua, MD 08/13/2019, 4:23 PM

## 2019-08-14 DIAGNOSIS — K7011 Alcoholic hepatitis with ascites: Secondary | ICD-10-CM

## 2019-08-14 LAB — BASIC METABOLIC PANEL
Anion gap: 9 (ref 5–15)
BUN: 14 mg/dL (ref 6–20)
CO2: 22 mmol/L (ref 22–32)
Calcium: 7.4 mg/dL — ABNORMAL LOW (ref 8.9–10.3)
Chloride: 115 mmol/L — ABNORMAL HIGH (ref 98–111)
Creatinine, Ser: 0.49 mg/dL (ref 0.44–1.00)
GFR calc Af Amer: >60 mL/min (ref >60)
GFR calc non Af Amer: >60 mL/min (ref >60)
Glucose, Bld: 116 mg/dL — ABNORMAL HIGH (ref 70–99)
Potassium: 3.6 mmol/L (ref 3.5–5.1)
Sodium: 146 mmol/L — ABNORMAL HIGH (ref 135–145)

## 2019-08-14 LAB — HEPATIC FUNCTION PANEL
ALT: 88 U/L — ABNORMAL HIGH (ref 0–44)
AST: 313 U/L — ABNORMAL HIGH (ref 15–41)
Albumin: 2.2 g/dL — ABNORMAL LOW (ref 3.5–5.0)
Alkaline Phosphatase: 251 U/L — ABNORMAL HIGH (ref 38–126)
Bilirubin, Direct: 7.8 mg/dL — ABNORMAL HIGH (ref 0.0–0.2)
Indirect Bilirubin: 3.9 mg/dL — ABNORMAL HIGH (ref 0.3–0.9)
Total Bilirubin: 11.7 mg/dL — ABNORMAL HIGH (ref 0.3–1.2)
Total Protein: 5.5 g/dL — ABNORMAL LOW (ref 6.5–8.1)

## 2019-08-14 LAB — CBC
HCT: 25 % — ABNORMAL LOW (ref 36.0–46.0)
Hemoglobin: 8.7 g/dL — ABNORMAL LOW (ref 12.0–15.0)
MCH: 33.5 pg (ref 26.0–34.0)
MCHC: 34.8 g/dL (ref 30.0–36.0)
MCV: 96.2 fL (ref 80.0–100.0)
Platelets: 120 10*3/uL — ABNORMAL LOW (ref 150–400)
RBC: 2.6 MIL/uL — ABNORMAL LOW (ref 3.87–5.11)
RDW: 16.2 % — ABNORMAL HIGH (ref 11.5–15.5)
WBC: 14.5 10*3/uL — ABNORMAL HIGH (ref 4.0–10.5)
nRBC: 0.3 % — ABNORMAL HIGH (ref 0.0–0.2)

## 2019-08-14 LAB — PHOSPHORUS: Phosphorus: 1.6 mg/dL — ABNORMAL LOW (ref 2.5–4.6)

## 2019-08-14 LAB — MAGNESIUM: Magnesium: 1.8 mg/dL (ref 1.7–2.4)

## 2019-08-14 LAB — AMMONIA: Ammonia: 49 umol/L — ABNORMAL HIGH (ref 9–35)

## 2019-08-14 LAB — GLUCOSE, CAPILLARY
Glucose-Capillary: 116 mg/dL — ABNORMAL HIGH (ref 70–99)
Glucose-Capillary: 97 mg/dL (ref 70–99)

## 2019-08-14 MED ORDER — POTASSIUM PHOSPHATES 15 MMOLE/5ML IV SOLN
30.0000 mmol | Freq: Once | INTRAVENOUS | Status: AC
  Administered 2019-08-14: 30 mmol via INTRAVENOUS
  Filled 2019-08-14: qty 10

## 2019-08-14 MED ORDER — MAGNESIUM SULFATE 2 GM/50ML IV SOLN
2.0000 g | Freq: Once | INTRAVENOUS | Status: AC
  Administered 2019-08-14: 2 g via INTRAVENOUS
  Filled 2019-08-14: qty 50

## 2019-08-14 NOTE — Progress Notes (Addendum)
Chloe [name] received referral from ICU staff: family present, pt. may be made comfort care.  Pt.'s Fr. Chloe [name]' @ bedside; pt. lying in bed, drowsy but reaching arms up occasionally.  Per RN pt. is in liver failure related to ETOH abuse.  Fr. shared that he has tried to convince pt. to address ETOH abuse for a long time; has spent time w/her in hospital for issues related to ETOH abuse for last five years; pt. has been unwilling to go to rehab or seek treatment, he says.  Pt. was treated for Lupus w/chemotherapy some time ago, and Fr. said this may have contributed to her increased drinking --> limited her activities and mobility.  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 is former Press photographer at [employer].  Pt.'s sister Chloe [name] arrived shortly after; [place] left family to visit w/pt.  Confirmed family contact info w/ICU Network engineer as both fr. and sis. had some difficulty being allowed up to rm. per visitors' desk staff.  CH remains available as needed.    08/14/19 1130  Clinical Encounter Type  Visited With Patient and family together  Visit Type Initial;Psychological support;Social support;Critical Care  Referral From Nurse  Spiritual Encounters  Spiritual Needs Emotional;Grief support  Stress Factors  Family Stress Factors Loss of control;Loss;Health changes;Major life changes

## 2019-08-14 NOTE — Progress Notes (Signed)
Name: Chloe [name] MRN: [id number] DOB: 11-Aug-1975     CONSULTATION DATE: 08/09/2019  REFERRING MD :  Dr. Reesa Chew  CHIEF COMPLAINT:  Acute Hypoxic Respiratory Failure  EVENTS OVER NIGHT:  The patient did begin to arouse a bit more than she had been yesterday   VITAL SIGNS:  Estimated body mass index is 24.91 kg/m as calculated from the following:   Height as of this encounter: 5\' 2"  (1.575 m).   Weight as of this encounter: 61.8 kg.  Temp:  [97.4 F (36.3 C)-98.8 F (37.1 C)] 98.6 F (37 C) (06/13 0700) Pulse Rate:  [43-103] 82 (06/13 0700) Resp:  [26-51] 42 (06/13 0700) BP: (89-109)/(41-73) 103/62 (06/13 0700) SpO2:  [89 %-97 %] 93 % (06/13 0700)   I/O last 3 completed shifts: In: 2972.7 [I.V.:1802.1; Blood:801; NG/GT:120; IV Piggyback:249.6] Out: 2300 [Urine:700; Emesis/NG output:1600] No intake/output data recorded.   SpO2: 93 % O2 Flow Rate (L/min): 2 L/min   Physical Examination:  GENERAL:critically ill appearing, +resp distress HEAD: Normocephalic, atraumatic.  EYES: Pupils equal, round, reactive to light.  icteric.  MOUTH: dry mucosal membrane. NECK: Supple. No JVD.  PULMONARY: few rhonchi CARDIOVASCULAR: S1 and S2. Regular rate and rhythm. No murmurs, rubs, or gallops.  GASTROINTESTINAL: distended, mild fluid wave, does not seem to be guarding, diminished bowel sounds.  MUSCULOSKELETAL: No swelling, clubbing, or edema.  NEUROLOGIC: obtunded SKIN:intact,cool,dry, obviously jaundice   I personally reviewed lab work that was obtained in last 24 hrs. CXR Independently reviewed  MEDICATIONS: I have reviewed all medications and confirmed regimen as documented   CULTURE RESULTS   Recent Results (from the past 240 hour(s))  Urine Culture     Status: Abnormal   Collection Time: 08/09/19  5:27 PM   Specimen: Urine, Random  Result Value Ref Range Status   Specimen Description   Final    URINE, RANDOM Performed at [facility], [address] [place], [place] [address]    Special Requests   Final    NONE Performed at [facility], [address] [place], [place] [address]    Culture >=100,000 COLONIES/mL ESCHERICHIA COLI (A)  Final   Report Status 08/11/2019 FINAL  Final   Organism ID, Bacteria ESCHERICHIA COLI (A)  Final      Susceptibility   Escherichia coli - MIC*    AMPICILLIN <=2 SENSITIVE Sensitive     CEFAZOLIN <=4 SENSITIVE Sensitive     CEFTRIAXONE <=1 SENSITIVE Sensitive     CIPROFLOXACIN <=0.25 SENSITIVE Sensitive     GENTAMICIN <=1 SENSITIVE Sensitive     IMIPENEM <=0.25 SENSITIVE Sensitive     NITROFURANTOIN <=16 SENSITIVE Sensitive     TRIMETH/SULFA <=20 SENSITIVE Sensitive     AMPICILLIN/SULBACTAM <=2 SENSITIVE Sensitive     PIP/TAZO <=4 SENSITIVE Sensitive     * >=100,000 COLONIES/mL ESCHERICHIA COLI  SARS Coronavirus 2 by RT PCR (hospital order, performed in [place] hospital lab) Nasopharyngeal Nasopharyngeal Swab     Status: None   Collection Time: 08/09/19  7:30 PM   Specimen: Nasopharyngeal Swab  Result Value Ref Range Status   SARS Coronavirus 2 NEGATIVE NEGATIVE Final    Comment: (NOTE) SARS-CoV-2 target nucleic acids are NOT DETECTED. The SARS-CoV-2 RNA is generally detectable in upper and lower respiratory specimens during the acute phase of infection. The lowest concentration of SARS-CoV-2 viral copies this assay can detect is 250 copies / mL. A negative result does not preclude SARS-CoV-2 infection and should not be used as the  sole basis for treatment or other patient management decisions.  A negative result may occur with improper specimen collection / handling, submission of specimen other than nasopharyngeal swab, presence of viral mutation(s) within the areas targeted by this assay, and inadequate number of viral copies (<250 copies / mL). A negative result must be combined with clinical observations, patient history, and epidemiological information. Fact Sheet for  Patients:   StrictlyIdeas.no Fact Sheet for Healthcare Providers: BankingDealers.co.za This test is not yet approved or cleared  by the [place] FDA and has been authorized for detection and/or diagnosis of SARS-CoV-2 by FDA under an Emergency Use Authorization (EUA).  This EUA will remain in effect (meaning this test can be used) for the duration of the COVID-19 declaration under Section 564(b)(1) of the Act, 21 U.S.C. section 360bbb-3(b)(1), unless the authorization is terminated or revoked sooner. Performed at [facility], [address] [place], [place] [address]   MRSA PCR Screening     Status: None   Collection Time: 08/13/19  1:09 PM   Specimen: Nasopharyngeal  Result Value Ref Range Status   MRSA by PCR NEGATIVE NEGATIVE Final    Comment:        The GeneXpert MRSA Assay (FDA approved for NASAL specimens only), is one component of a comprehensive MRSA colonization surveillance program. It is not intended to diagnose MRSA infection nor to guide or monitor treatment for MRSA infections. Performed at [facility], [address] [place], [place] [address]           IMAGING    DG Abd 1 View  Result Date: 08/13/2019 CLINICAL DATA:  Nasogastric tube placement EXAM: ABDOMEN - 1 VIEW COMPARISON:  CT abdomen and pelvis August 10, 2019 FINDINGS: Nasogastric tube tip and side port are in the stomach. Loops of bowel dilatation remain. Note that the cecum is now distended and oriented perpendicular to the lumbar spine, a change from recent CT. No free air. IMPRESSION: Nasogastric tube tip and side port in stomach. Persistent bowel dilatation. Note that there is now dilatation of the cecum with cecum now perpendicular to the lumbar spine. Question early cecal volvulus. This finding may warrant correlation with abdominal CT to further evaluate. No free air. These results will be called to the ordering clinician or  representative by the Radiologist Assistant, and communication documented in the PACS or [employer]. Electronically Signed   By: Lowella Grip III M.D.   On: 08/13/2019 13:41     Nutrition Status: Nutrition Problem: Inadequate oral intake Etiology: lethargy/confusion Signs/Symptoms: meal completion < 25% Interventions: Refer to RD note for recommendations     Indwelling Urinary Catheter continued, requirement due to   Reason to continue Indwelling Urinary Catheter strict Intake/Output monitoring for hemodynamic instability    ASSESSMENT AND PLAN SYNOPSIS  GOALS OF CARE/ACP: Below are enumerated medical issues, family leaning toward comfort measures, but at this time wish minimal supportive medical care. The patient will not undergo further diagnostics or procedures, except for perhaps a paracentesis for comfort with respirations.   6/12 Severe ACUTE Hypoxic and Hypercapnic Respiratory Failure -the patient likely aspirated -NGT removal of a liter of dark fluid -removal of this volume did improve her respirations -she would be significant risk for intubation, however her family wishes she be DNR/DNI -altered MS from ammonia and likely Ativan administration  -bicarb given to mitigate tachypnea related to metabolic acidosis on ABG  6/13 -ammonia this morning was normal, maintenance lactulose dosing -bicarbonate restored to 22 -still  with some tachypnea, but improved -not to be intubated -aspiration antibiosis   NEUROLOGY Acute toxic metabolic encephalopathy  -altered -mildly elevated ammonia, now normal  -benzo administration?? -benzo (valium per tube) now prn only for agitation and held for sedation   SHOCK-SEPSIS/HYPOVOLUMIC/CARDIOGENIC -improved BP with treatment of acidosis and volume  CARDIAC ICU monitoring  ID -Rocephin started -Broaden for now to cefepime, vanc, flagyl -Paracentesis ordered, send fluid for studies and culture  GI Hepatic  failure UGIB Cecal Volvulus?  Yesterday MELD 30 50% 28mo  mortality for those with similar pattern of lab findings Synthetic function decline with elevated INR and depressed albumin. Bilirubin 11.7 today from peak 12.9 Nodular liver associated with significant cirrhosis Supportive care, fend off hepatic encephalopathy and continue lactulose  Replete/Replace electrolytes and FFP if there is bleeding with INR sustained >1.7  GI has deferred EGD for now Maintain protonix and octreotide at this time May require FFP Transfusion trigger HgB 7, presently stable around 10  The patient is DNR, and family does not wish surgical intervention even if this is a true volvulus Will not pursue further workup at this time as she is at risk for CT without an airway    NUTRITIONAL STATUS NPO at the moment   ENDO - will use ICU hypoglycemic\Hyperglycemia protocol if needed   ELECTROLYTES -follow labs as needed -replace as needed -pharmacy consultation and following    DVT/GI PRX ordered and assessed TRANSFUSIONS AS NEEDED MONITOR FSBS I Assessed the need for Labs I Assessed the need for Foley I Assessed the need for Central Venous Line Family Discussion when available I Assessed the need for Mobilization I made an Assessment of medications to be adjusted accordingly Safety Risk assessment Completed   Overall, patient is critically ill, prognosis is guarded.  Patient with Multiorgan failure and at high risk for cardiac arrest and death.   EM2  //Ariella Voit

## 2019-08-14 NOTE — Progress Notes (Signed)
Vonda Antigua, MD [address], [place], [place], [place], [address] [address], [place], [place], [place], [address] Phone: [phone]  Fax: [phone]   Subjective:  Patient remains somnolent.  Lying in bed.  Abdomen distended. Objective: Exam: Vital signs in last 24 hours: Vitals:   08/14/19 0900 08/14/19 1000 08/14/19 1100 08/14/19 1200  BP: 101/63 103/63 (!) 96/56 103/60  Pulse: 81 81 77 82  Resp: (!) 42 (!) 41 (!) 32 (!) 34  Temp:      TempSrc:      SpO2: 94% 96% 93% 92%  Weight:      Height:       Weight change:   Intake/Output Summary (Last 24 hours) at 08/14/2019 1212 Last data filed at 08/14/2019 0900 Gross per 24 hour  Intake 1218.35 ml  Output 1600 ml  Net -381.65 ml    General: No acute distress, AAO x3 Abd: Soft, NT/ND, No HSM Skin: Warm, no rashes Neck: Supple, Trachea midline   Lab Results: Lab Results  Component Value Date   WBC 14.5 (H) 08/14/2019   HGB 8.7 (L) 08/14/2019   HCT 25.0 (L) 08/14/2019   MCV 96.2 08/14/2019   PLT 120 (L) 08/14/2019   Micro Results: Recent Results (from the past 240 hour(s))  Urine Culture     Status: Abnormal   Collection Time: 08/09/19  5:27 PM   Specimen: Urine, Random  Result Value Ref Range Status   Specimen Description   Final    URINE, RANDOM Performed at [facility], [address] [place], [place] [address]    Special Requests   Final    NONE Performed at [facility], [address] [place], [place] [address]    Culture >=100,000 COLONIES/mL ESCHERICHIA COLI (A)  Final   Report Status 08/11/2019 FINAL  Final   Organism ID, Bacteria ESCHERICHIA COLI (A)  Final      Susceptibility   Escherichia coli - MIC*    AMPICILLIN <=2 SENSITIVE Sensitive     CEFAZOLIN <=4 SENSITIVE Sensitive     CEFTRIAXONE <=1 SENSITIVE Sensitive     CIPROFLOXACIN <=0.25 SENSITIVE Sensitive     GENTAMICIN <=1 SENSITIVE Sensitive     IMIPENEM <=0.25 SENSITIVE Sensitive      NITROFURANTOIN <=16 SENSITIVE Sensitive     TRIMETH/SULFA <=20 SENSITIVE Sensitive     AMPICILLIN/SULBACTAM <=2 SENSITIVE Sensitive     PIP/TAZO <=4 SENSITIVE Sensitive     * >=100,000 COLONIES/mL ESCHERICHIA COLI  SARS Coronavirus 2 by RT PCR (hospital order, performed in [place] hospital lab) Nasopharyngeal Nasopharyngeal Swab     Status: None   Collection Time: 08/09/19  7:30 PM   Specimen: Nasopharyngeal Swab  Result Value Ref Range Status   SARS Coronavirus 2 NEGATIVE NEGATIVE Final    Comment: (NOTE) SARS-CoV-2 target nucleic acids are NOT DETECTED. The SARS-CoV-2 RNA is generally detectable in upper and lower respiratory specimens during the acute phase of infection. The lowest concentration of SARS-CoV-2 viral copies this assay can detect is 250 copies / mL. A negative result does not preclude SARS-CoV-2 infection and should not be used as the sole basis for treatment or other patient management decisions.  A negative result may occur with improper specimen collection / handling, submission of specimen other than nasopharyngeal swab, presence of viral mutation(s) within the areas targeted by this assay, and inadequate number of viral copies (<250 copies / mL). A negative result must be combined with clinical observations, patient history, and epidemiological  information. Fact Sheet for Patients:   StrictlyIdeas.no Fact Sheet for Healthcare Providers: BankingDealers.co.za This test is not yet approved or cleared  by the [place] FDA and has been authorized for detection and/or diagnosis of SARS-CoV-2 by FDA under an Emergency Use Authorization (EUA).  This EUA will remain in effect (meaning this test can be used) for the duration of the COVID-19 declaration under Section 564(b)(1) of the Act, 21 U.S.C. section 360bbb-3(b)(1), unless the authorization is terminated or revoked sooner. Performed at [facility],  [address] [place], [place] [address]   MRSA PCR Screening     Status: None   Collection Time: 08/13/19  1:09 PM   Specimen: Nasopharyngeal  Result Value Ref Range Status   MRSA by PCR NEGATIVE NEGATIVE Final    Comment:        The GeneXpert MRSA Assay (FDA approved for NASAL specimens only), is one component of a comprehensive MRSA colonization surveillance program. It is not intended to diagnose MRSA infection nor to guide or monitor treatment for MRSA infections. Performed at [facility], [address] [place], [place] [address]    Studies/Results: [place] Abd 1 View  Result Date: 08/13/2019 CLINICAL DATA:  Nasogastric tube placement EXAM: ABDOMEN - 1 VIEW COMPARISON:  CT abdomen and pelvis August 10, 2019 FINDINGS: Nasogastric tube tip and side port are in the stomach. Loops of bowel dilatation remain. Note that the cecum is now distended and oriented perpendicular to the lumbar spine, a change from recent CT. No free air. IMPRESSION: Nasogastric tube tip and side port in stomach. Persistent bowel dilatation. Note that there is now dilatation of the cecum with cecum now perpendicular to the lumbar spine. Question early cecal volvulus. This finding may warrant correlation with abdominal CT to further evaluate. No free air. These results will be called to the ordering clinician or representative by the Radiologist Assistant, and communication documented in the PACS or [employer]. Electronically Signed   By: Lowella Grip III M.D.   On: 08/13/2019 13:41   Medications:  Scheduled Meds: . sodium chloride   Intravenous Once  . Chlorhexidine Gluconate Cloth  6 each Topical Daily  . folic acid  1 mg Oral Daily  . lactulose  30 g Oral TID  . levothyroxine  75 mcg Oral Q0600  . thiamine  100 mg Oral Daily   Or  . thiamine  100 mg Intravenous Daily   Continuous Infusions: . sodium chloride 125 mL/hr at 08/14/19 0456  . ceFEPime (MAXIPIME) IV 2 g (08/14/19 4403)    . metronidazole 500 mg (08/14/19 4742)  . potassium PHOSPHATE IVPB (in mmol)    . vancomycin 750 mg (08/14/19 0723)   PRN Meds:.albuterol, diazepam, ipratropium, ondansetron **OR** ondansetron (ZOFRAN) IV   Assessment: Active Problems:   Upper GI bleed   Lower urinary tract infectious disease   Acute liver failure without hepatic coma   Acute respiratory failure with hypoxia (HCC)   Metabolic acidemia    Plan: Family leaning toward comfort care  With distended abdomen, would recommend CT scan if family agreeable to evaluate for volvulus and consult surgery for the same  Paracentesis recommended as well, after CT scan  No active bleeding at this time.  Continue PPI.  Bilirubin improved from yesterday.  Endoscopic procedures without active GI bleeding, remain higher risks than benefits before bowel distention/volvulus is addressed  Dr. Alice Reichert to follow from tomorrow   LOS: 5 days   Vonda Antigua, MD 08/14/2019, 12:12  PM

## 2019-08-14 NOTE — Progress Notes (Signed)
PROGRESS NOTE    Chloe [name]  VCB:[id number] DOB: 03-16-1975 DOA: 08/09/2019 PCP: [facility]   Brief Narrative:  Patient was brought in from home a by EMS after she was found fallen down on the floor by her father. Patient has history of chronic alcoholism with significant liver disorder. She reports drinking a glass of wine and a glass of vodka on a daily basis. She has been vomiting bright red blood mixed with coffee ground emesis few days ago. Hasn't vomited lately.   Developed high-grade fever with tachycardia.  Started on Rocephin for E. coli UTI.  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  So GI will not proceed with scope at this time due to high risk than benefit.  Subjective: Patient was seen in the ICU.  Patient is a little more awake as compared to yesterday, initially I open her eyes and then she open her eyes by calling her name.  Patient was able to reply what is her name and dosing of unable to stay awake.  Patient is not in a condition to offer any complaints, patient is still tachycardic and tachypneic.  Critically ill.   Assessment & Plan:   Active Problems:   Upper GI bleed   Lower urinary tract infectious disease   Acute liver failure without hepatic coma   Acute respiratory failure with hypoxia (HCC)   Metabolic acidemia   Acute toxic/metabolic encephalopathy, due to alcohol abuse/metabolic acidosis and high ammonia level Continue to monitor mental status   Alcohol use disorder with intoxication, alcoholic liver disease MELDscore of 29 with electrolyte derangement -Maddery score 34 -GI consultation with Dr Estil Daft on hold due to high grade fever--plan was to get it done over the weekend, patient became critically ill.  so EGD will be done at this time -Correct electrolytes -Monitor LFTs and INR -held Lasix and spironolactone due to low BP -CIWA protocol with  banana bag -Seizure and fall precautions -Social work consultation for discussion of detox and rehabilitation options --6/12 patient's condition became more critical, transferred to ICU, CODE STATUS was changed to DNR as per discussion of ICU attending with the family. -Palliative care will follow from tomorrow and discussed with the family regarding goals of care and management -6/12 NG tube inserted for feeding and medications -Continue lactulose and monitor ammonia level daily    UpperGI bleed- H&H stable -- patient likely has variceal bleed given history of alcoholic cirrhosis of liver. -s/p IV Protonix and octreotide drip- for total of 72 hours. -Serial CBCs--stable - G.I. consulted, EGD was planned for now as patient may have cecal volvulus and abdominal distention, so risk of scope is higher than benefit.  H&H is stable -Holding anticoagulants   Acquired coagulopathy secondary to liver cirrhosis -low platelet count, elevated PT/INR --Transfuse FFP if any signs of bleeding.  Sepsis due to UTI/acute pyelonephritis as noted on CT abdomen--POA -s/p IV Rocephin -6/12 started vancomycin, cefepime and Flagyl, started in the ICU because patient's condition deteriorated -UC-pansensitive ecoli  Hypophosphatemia Phosphorus repleted.  Continue to monitor.  Hypomagnesemia, magnesium repleted.  History of depression/anxiety -was on Cymbalta, held for now  History of hypothyroidism -Continue Synthroid 75 mcg    6/12 patient was in metabolic acidosis, tachypneic and tachycardic with abdominal distention ABG was consistent with metabolic acidosis Patient was given 2 Amps of bicarb IV push, NG tube was inserted for feeding and medications as patient was not able  to take anything p.o. About treatment continued Abdominal x-ray was ordered by critical care which showed abdominal distention and possibility of cecal volvulus and recommended CT scan of abdomen which I informed to  critical care attending Rapid response RN was called and patient was transferred to ICU/critical care for further management. CODE STATUS was changed in the ICU to DNR due to poor prognosis  6/13 during hospital stay patient did not require intubation, cortisol was changed to DNR and family is leaning towards comfort cares.  Palliative care will discuss with family tomorrow for further management and goals of care. Patient is being transferred out from the ICU to the floor for further management.    Objective: Vitals:   08/14/19 0900 08/14/19 1000 08/14/19 1100 08/14/19 1200  BP: 101/63 103/63 (!) 96/56 103/60  Pulse: 81 81 77 82  Resp: (!) 42 (!) 41 (!) 32 (!) 34  Temp:      TempSrc:      SpO2: 94% 96% 93% 92%  Weight:      Height:        Intake/Output Summary (Last 24 hours) at 08/14/2019 1348 Last data filed at 08/14/2019 0900 Gross per 24 hour  Intake 1218.35 ml  Output 1600 ml  Net -381.65 ml   Filed Weights   08/09/19 1618 08/10/19 2127  Weight: 57.6 kg 61.8 kg    Examination:  General exam: Very lethargic, tachypneic, labored more awake than yesterday  espiratory system: Acidotic breathing, tachypneic  Cardiovascular system: S1 & S2 heard, RRR. No JVD, murmurs, rubs, gallops or clicks. Gastrointestinal system:  Firm and distended due to ascites, bowel sounds positive. Central nervous system: Obtunded, arouses today and open her eyes but could not stay awake.   Extremities: No edema, no cyanosis, pulses intact and symmetrical, 2+ edema Psychiatry: Unable to assess as patient is very lethargic.    DVT prophylaxis: SCDs. Code Status: DNR Family Communication: None available at bedside  Disposition Plan:  Status is: Inpatient  Remains inpatient appropriate because:Inpatient level of care appropriate due to severity of illness   Dispo: The patient is from: Home              Anticipated d/c is to: Home              Anticipated d/c date is: > 3 days               Patient currently is not medically stable to d/c.  Consultants:   GI  Critical care  Procedures:  Antimicrobials:  D/c'd on 6/12 Rocephin 6/12  vancomycin, cefepime, metronidazole   Data Reviewed: I have personally reviewed following labs and imaging studies  CBC: Recent Labs  Lab 08/10/19 1223 08/10/19 2125 08/12/19 0538 08/13/19 0607 08/14/19 0432  WBC 5.6 5.0 6.9 11.8* 14.5*  HGB 9.6* 10.0* 9.9* 10.4* 8.7*  HCT 26.9* 27.9* 27.9* 29.1* 25.0*  MCV 95.4 95.9 95.5 93.3 96.2  PLT 26* 30* 42* 85* 026*   Basic Metabolic Panel: Recent Labs  Lab 08/09/19 2057 08/10/19 0555 08/11/19 0408 08/12/19 0538 08/13/19 0607 08/13/19 1202 08/14/19 0432  NA  --    < > 131* 134* 139 141 146*  K  --    < > 3.3* 3.5 4.3 4.5 3.6  CL  --    < > 101 104 111 110 115*  CO2  --    < > 22 21* 19* 19* 22  GLUCOSE  --    < > 156* 183* 123* 147*  116*  BUN  --    < > 15 11 9 12 14   CREATININE  --    < > 0.71 0.55 0.44 0.62 0.49  CALCIUM  --    < > 7.6* 7.9* 7.7* 7.5* 7.4*  MG 0.9*   < > 1.3* 2.1 1.5* 1.5* 1.8  PHOS 2.6  --   --  <1.0* UNABLE TO REPORT DUE TO ICTERUS UNABLE TO REPORT DUE TO ICTERUS 1.6*   < > = values in this interval not displayed.   GFR: Estimated Creatinine Clearance: 78.4 mL/min (by C-G formula based on SCr of 0.49 mg/dL). Liver Function Tests: Recent Labs  Lab 08/09/19 1624 08/09/19 1624 08/11/19 0408 08/11/19 0408 08/12/19 0538 08/12/19 1853 08/13/19 0607 08/13/19 1202 08/14/19 0432  AST 314*  --  189*  --   --  203*  --  305* 313*  ALT 76*  --  56*  --   --  68*  --  84* 88*  ALKPHOS 117  --  111  --   --  131*  --  222* 251*  BILITOT 11.7*  --  7.9*  --   --  11.6*  --  12.9* 11.7*  PROT 7.7  --  5.8*  --   --  6.1*  --  6.0* 5.5*  ALBUMIN 3.8   < > 2.4*   < > 2.4* 2.5* 2.5* 2.5* 2.2*   < > = values in this interval not displayed.   No results for input(s): LIPASE, AMYLASE in the last 168 hours. Recent Labs  Lab 08/09/19 2057 08/12/19 1853  08/13/19 0813 08/14/19 0432  AMMONIA 31 45* 54* 49*   Coagulation Profile: Recent Labs  Lab 08/09/19 1624 08/10/19 0612 08/12/19 1853  INR 1.8* 1.8* 1.9*   Cardiac Enzymes: No results for input(s): CKTOTAL, CKMB, CKMBINDEX, TROPONINI in the last 168 hours. BNP (last 3 results) No results for input(s): PROBNP in the last 8760 hours. HbA1C: No results for input(s): HGBA1C in the last 72 hours. CBG: Recent Labs  Lab 08/13/19 1152 08/13/19 1218 08/13/19 2357 08/14/19 0404 08/14/19 1150  GLUCAP 130* 137* 117* 116* 97   Lipid Profile: No results for input(s): CHOL, HDL, LDLCALC, TRIG, CHOLHDL, LDLDIRECT in the last 72 hours. Thyroid Function Tests: No results for input(s): TSH, T4TOTAL, FREET4, T3FREE, THYROIDAB in the last 72 hours. Anemia Panel: No results for input(s): VITAMINB12, FOLATE, FERRITIN, TIBC, IRON, RETICCTPCT in the last 72 hours. Sepsis Labs: Recent Labs  Lab 08/10/19 0555 08/10/19 1114 08/11/19 0408 08/12/19 0538  PROCALCITON 4.18  --  2.78 1.98  LATICACIDVEN 1.2 1.1  --   --     Recent Results (from the past 240 hour(s))  Urine Culture     Status: Abnormal   Collection Time: 08/09/19  5:27 PM   Specimen: Urine, Random  Result Value Ref Range Status   Specimen Description   Final    URINE, RANDOM Performed at [facility], [address] [place], [place] [address]    Special Requests   Final    NONE Performed at [facility], [address] [place], [place] [address]    Culture >=100,000 COLONIES/mL ESCHERICHIA COLI (A)  Final   Report Status 08/11/2019 FINAL  Final   Organism ID, Bacteria ESCHERICHIA COLI (A)  Final      Susceptibility   Escherichia coli - MIC*    AMPICILLIN <=2 SENSITIVE Sensitive     CEFAZOLIN <=4 SENSITIVE Sensitive  CEFTRIAXONE <=1 SENSITIVE Sensitive     CIPROFLOXACIN <=0.25 SENSITIVE Sensitive     GENTAMICIN <=1 SENSITIVE Sensitive     IMIPENEM <=0.25 SENSITIVE Sensitive      NITROFURANTOIN <=16 SENSITIVE Sensitive     TRIMETH/SULFA <=20 SENSITIVE Sensitive     AMPICILLIN/SULBACTAM <=2 SENSITIVE Sensitive     PIP/TAZO <=4 SENSITIVE Sensitive     * >=100,000 COLONIES/mL ESCHERICHIA COLI  SARS Coronavirus 2 by RT PCR (hospital order, performed in [facility] hospital lab) Nasopharyngeal Nasopharyngeal Swab     Status: None   Collection Time: 08/09/19  7:30 PM   Specimen: Nasopharyngeal Swab  Result Value Ref Range Status   SARS Coronavirus 2 NEGATIVE NEGATIVE Final    Comment: (NOTE) SARS-CoV-2 target nucleic acids are NOT DETECTED. The SARS-CoV-2 RNA is generally detectable in upper and lower respiratory specimens during the acute phase of infection. The lowest concentration of SARS-CoV-2 viral copies this assay can detect is 250 copies / mL. A negative result does not preclude SARS-CoV-2 infection and should not be used as the sole basis for treatment or other patient management decisions.  A negative result may occur with improper specimen collection / handling, submission of specimen other than nasopharyngeal swab, presence of viral mutation(s) within the areas targeted by this assay, and inadequate number of viral copies (<250 copies / mL). A negative result must be combined with clinical observations, patient history, and epidemiological information. Fact Sheet for Patients:   StrictlyIdeas.no Fact Sheet for Healthcare Providers: BankingDealers.co.za This test is not yet approved or cleared  by the [place] FDA and has been authorized for detection and/or diagnosis of SARS-CoV-2 by FDA under an Emergency Use Authorization (EUA).  This EUA will remain in effect (meaning this test can be used) for the duration of the COVID-19 declaration under Section 564(b)(1) of the Act, 21 U.S.C. section 360bbb-3(b)(1), unless the authorization is terminated or revoked sooner. Performed at [facility],  [address] [place], [place] [address]   MRSA PCR Screening     Status: None   Collection Time: 08/13/19  1:09 PM   Specimen: Nasopharyngeal  Result Value Ref Range Status   MRSA by PCR NEGATIVE NEGATIVE Final    Comment:        The GeneXpert MRSA Assay (FDA approved for NASAL specimens only), is one component of a comprehensive MRSA colonization surveillance program. It is not intended to diagnose MRSA infection nor to guide or monitor treatment for MRSA infections. Performed at [facility], [address], [place], [place] [address]      Radiology Studies: DG Abd 1 View  Result Date: 08/13/2019 CLINICAL DATA:  Nasogastric tube placement EXAM: ABDOMEN - 1 VIEW COMPARISON:  CT abdomen and pelvis August 10, 2019 FINDINGS: Nasogastric tube tip and side port are in the stomach. Loops of bowel dilatation remain. Note that the cecum is now distended and oriented perpendicular to the lumbar spine, a change from recent CT. No free air. IMPRESSION: Nasogastric tube tip and side port in stomach. Persistent bowel dilatation. Note that there is now dilatation of the cecum with cecum now perpendicular to the lumbar spine. Question early cecal volvulus. This finding may warrant correlation with abdominal CT to further evaluate. No free air. These results will be called to the ordering clinician or representative by the Radiologist Assistant, and communication documented in the PACS or [employer]. Electronically Signed   By: Lowella Grip III M.D.   On: 08/13/2019 13:41  Scheduled Meds: . sodium chloride   Intravenous Once  . Chlorhexidine Gluconate Cloth  6 each Topical Daily  . folic acid  1 mg Oral Daily  . lactulose  30 g Oral TID  . levothyroxine  75 mcg Oral Q0600  . thiamine  100 mg Oral Daily   Or  . thiamine  100 mg Intravenous Daily   Continuous Infusions: . sodium chloride 125 mL/hr at 08/14/19 0456  . ceFEPime (MAXIPIME) IV 2 g (08/14/19 7858)  .  metronidazole 500 mg (08/14/19 8502)  . potassium PHOSPHATE IVPB (in mmol) 30 mmol (08/14/19 1250)  . vancomycin 750 mg (08/14/19 0723)     LOS: 5 days   Time spent: 40 minutes.  Val Riles, MD [facility]  If 7PM-7AM, please contact night-coverage [email or web address]  08/14/2019, 1:48 PM   This record has been created using Systems analyst. Errors have been sought and corrected,but may not always be located. Such creation errors do not reflect on the standard of care.

## 2019-08-14 NOTE — Consult Note (Signed)
[facility] for Electrolyte Monitoring and Replacement   Recent Labs: Potassium (mmol/L)  Date Value  08/14/2019 3.6   Magnesium (mg/dL)  Date Value  08/14/2019 1.8   Calcium (mg/dL)  Date Value  08/14/2019 7.4 (L)   Albumin (g/dL)  Date Value  08/14/2019 2.2 (L)   Phosphorus (mg/dL)  Date Value  08/14/2019 1.6 (L)   Sodium (mmol/L)  Date Value  08/14/2019 146 (H)   Corrected Ca: 9.2 mg/dL  Assessment: 44 y.o.femalewith a known history of alcohol use disorder with withdrawal syndrome, CKD, anxiety/depression, hypertension, lupus, seizures presents to the emergency department for evaluation of alcohol intoxication. MIVF: 0.9% NaCl w/ 20 mEq/L at 125 mL/hr  Goal of Therapy:  Potassium 4.0 - 5.1 mmol/L Magnesium 2.0 - 2.4 mg/dL All Other Electrolytes WNL  Plan:   IV potassium phosphate 30 mmol x 1 (this provides approximately 44 mEq IV potassium)  Repeat 2 grams IV magnesium sulfate x 1  F/u electrolytes at 6/14 am and replace as needed  Dallie Piles ,PharmD Clinical Pharmacist 08/14/2019 7:03 AM

## 2019-08-14 NOTE — Progress Notes (Signed)
1405 Report called to Melissa on 1C. Transferred to 106 via ICU bed.

## 2019-08-15 ENCOUNTER — Inpatient Hospital Stay: Payer: Medicare PPO

## 2019-08-15 ENCOUNTER — Encounter: Payer: Self-pay | Admitting: Family Medicine

## 2019-08-15 DIAGNOSIS — Z66 Do not resuscitate: Secondary | ICD-10-CM

## 2019-08-15 DIAGNOSIS — G9340 Encephalopathy, unspecified: Secondary | ICD-10-CM

## 2019-08-15 DIAGNOSIS — Z515 Encounter for palliative care: Secondary | ICD-10-CM

## 2019-08-15 LAB — HEPATIC FUNCTION PANEL
ALT: 79 U/L — ABNORMAL HIGH (ref 0–44)
AST: 237 U/L — ABNORMAL HIGH (ref 15–41)
Albumin: 2.2 g/dL — ABNORMAL LOW (ref 3.5–5.0)
Alkaline Phosphatase: 292 U/L — ABNORMAL HIGH (ref 38–126)
Bilirubin, Direct: 8 mg/dL — ABNORMAL HIGH (ref 0.0–0.2)
Indirect Bilirubin: 4.8 mg/dL — ABNORMAL HIGH (ref 0.3–0.9)
Total Bilirubin: 12.8 mg/dL — ABNORMAL HIGH (ref 0.3–1.2)
Total Protein: 5.7 g/dL — ABNORMAL LOW (ref 6.5–8.1)

## 2019-08-15 LAB — PREPARE FRESH FROZEN PLASMA
Unit division: 0
Unit division: 0

## 2019-08-15 LAB — CBC
HCT: 24.4 % — ABNORMAL LOW (ref 36.0–46.0)
Hemoglobin: 8.8 g/dL — ABNORMAL LOW (ref 12.0–15.0)
MCH: 34.2 pg — ABNORMAL HIGH (ref 26.0–34.0)
MCHC: 36.1 g/dL — ABNORMAL HIGH (ref 30.0–36.0)
MCV: 94.9 fL (ref 80.0–100.0)
Platelets: 138 10*3/uL — ABNORMAL LOW (ref 150–400)
RBC: 2.57 MIL/uL — ABNORMAL LOW (ref 3.87–5.11)
RDW: 16.7 % — ABNORMAL HIGH (ref 11.5–15.5)
WBC: 13.5 10*3/uL — ABNORMAL HIGH (ref 4.0–10.5)
nRBC: 0.7 % — ABNORMAL HIGH (ref 0.0–0.2)

## 2019-08-15 LAB — BASIC METABOLIC PANEL
Anion gap: 6 (ref 5–15)
BUN: 17 mg/dL (ref 6–20)
CO2: 23 mmol/L (ref 22–32)
Calcium: 7.4 mg/dL — ABNORMAL LOW (ref 8.9–10.3)
Chloride: 121 mmol/L — ABNORMAL HIGH (ref 98–111)
Creatinine, Ser: 0.62 mg/dL (ref 0.44–1.00)
GFR calc Af Amer: >60 mL/min (ref >60)
GFR calc non Af Amer: >60 mL/min (ref >60)
Glucose, Bld: 95 mg/dL (ref 70–99)
Potassium: 3.5 mmol/L (ref 3.5–5.1)
Sodium: 150 mmol/L — ABNORMAL HIGH (ref 135–145)

## 2019-08-15 LAB — BPAM FFP
Blood Product Expiration Date: 202106162359
Blood Product Expiration Date: 202106182359
ISSUE DATE / TIME: 202106122040
ISSUE DATE / TIME: 202106130226
Unit Type and Rh: 5100
Unit Type and Rh: 6200

## 2019-08-15 LAB — MAGNESIUM: Magnesium: 2.3 mg/dL (ref 1.7–2.4)

## 2019-08-15 LAB — GLUCOSE, CAPILLARY
Glucose-Capillary: 90 mg/dL (ref 70–99)
Glucose-Capillary: 76 mg/dL (ref 70–99)

## 2019-08-15 LAB — AMMONIA: Ammonia: 33 umol/L (ref 9–35)

## 2019-08-15 LAB — PHOSPHORUS: Phosphorus: UNDETERMINED mg/dL (ref 2.5–4.6)

## 2019-08-15 MED ORDER — DEXTROSE 5 % IV SOLN: INTRAVENOUS | Status: DC

## 2019-08-15 MED ORDER — MIDODRINE HCL 5 MG PO TABS
5.0000 mg | ORAL_TABLET | Freq: Three times a day (TID) | ORAL | Status: DC
  Administered 2019-08-16 – 2019-08-18 (×5): 5 mg via ORAL
  Filled 2019-08-15 (×5): qty 1

## 2019-08-15 MED ORDER — POTASSIUM CHLORIDE 10 MEQ/100ML IV SOLN
10.0000 meq | INTRAVENOUS | Status: AC
  Administered 2019-08-15 (×2): 10 meq via INTRAVENOUS
  Filled 2019-08-15: qty 100

## 2019-08-15 MED ORDER — LORAZEPAM 2 MG/ML IJ SOLN: 1.0000 mg | INTRAMUSCULAR | Status: AC | PRN

## 2019-08-15 MED ORDER — IOHEXOL 9 MG/ML PO SOLN
500.0000 mL | ORAL | Status: AC
  Administered 2019-08-15 (×2): 500 mL via ORAL

## 2019-08-15 MED ORDER — POTASSIUM PHOSPHATES 15 MMOLE/5ML IV SOLN: 30.0000 mmol | Freq: Once | INTRAVENOUS | Status: DC

## 2019-08-15 MED ORDER — IOHEXOL 300 MG/ML  SOLN
75.0000 mL | Freq: Once | INTRAMUSCULAR | Status: AC | PRN
  Administered 2019-08-15: 15:00:00 75 mL via INTRAVENOUS

## 2019-08-15 MED ORDER — LORAZEPAM 1 MG PO TABS: 1.0000 mg | ORAL_TABLET | ORAL | Status: AC | PRN

## 2019-08-15 NOTE — Progress Notes (Signed)
[facility] Gastroenterology Inpatient Progress Note  Subjective: Patient seen for f/u of encephalopathy, GI bleed, ascites. Patient lying in bed unresponsive to voice, but moving extremities randomly, mildly agitated. Family, including father, at the bedside.   Objective: Vital signs in last 24 hours: Temp:  [97.7 F (36.5 C)-98.1 F (36.7 C)] 97.9 F (36.6 C) (06/14 1154) Pulse Rate:  [79-92] 79 (06/14 1154) Resp:  [16-20] 16 (06/14 1154) BP: (97-108)/(56-69) 101/67 (06/14 1154) SpO2:  [91 %-100 %] 95 % (06/14 1154) Blood pressure 101/67, pulse 79, temperature 97.9 F (36.6 C), resp. rate 16, height 5\' 2"  (1.575 m), weight 61.8 kg, SpO2 95 %.    Intake/Output from previous day: 06/13 0701 - 06/14 0700 In: 3195.1 [I.V.:875; NG/GT:60; IV Piggyback:2260.1] Out: 600 [Emesis/NG output:600]  Intake/Output this shift: Total I/O In: 150 [IV Piggyback:150] Out: 100 [Emesis/NG output:100]   General appearance:  Confused, has random non-volitional movements. Resp:  Few rhonchi. Cardio:  Tachycardic, no gallop GI:  Markedly distended, firm. No rebound. BS hypoactive. Extremities: 1+ edema   Lab Results: Results for orders placed or performed during the hospital encounter of 08/09/19 (from the past 24 hour(s))  Basic metabolic panel     Status: Abnormal   Collection Time: 08/15/19  6:32 AM  Result Value Ref Range   Sodium 150 (H) 135 - 145 mmol/L   Potassium 3.5 3.5 - 5.1 mmol/L   Chloride 121 (H) 98 - 111 mmol/L   CO2 23 22 - 32 mmol/L   Glucose, Bld 95 70 - 99 mg/dL   BUN 17 6 - 20 mg/dL   Creatinine, Ser 0.62 0.44 - 1.00 mg/dL   Calcium 7.4 (L) 8.9 - 10.3 mg/dL   GFR calc non Af Amer >60 >60 mL/min   GFR calc Af Amer >60 >60 mL/min   Anion gap 6 5 - 15  CBC     Status: Abnormal   Collection Time: 08/15/19  6:32 AM  Result Value Ref Range   WBC 13.5 (H) 4.0 - 10.5 K/uL   RBC 2.57 (L) 3.87 - 5.11 MIL/uL   Hemoglobin 8.8 (L) 12.0 - 15.0 g/dL   HCT 24.4 (L) 36 - 46 %    MCV 94.9 80.0 - 100.0 fL   MCH 34.2 (H) 26.0 - 34.0 pg   MCHC 36.1 (H) 30.0 - 36.0 g/dL   RDW 16.7 (H) 11.5 - 15.5 %   Platelets 138 (L) 150 - 400 K/uL   nRBC 0.7 (H) 0.0 - 0.2 %  Hepatic function panel     Status: Abnormal   Collection Time: 08/15/19  6:32 AM  Result Value Ref Range   Total Protein 5.7 (L) 6.5 - 8.1 g/dL   Albumin 2.2 (L) 3.5 - 5.0 g/dL   AST 237 (H) 15 - 41 U/L   ALT 79 (H) 0 - 44 U/L   Alkaline Phosphatase 292 (H) 38 - 126 U/L   Total Bilirubin 12.8 (H) 0.3 - 1.2 mg/dL   Bilirubin, Direct 8.0 (H) 0.0 - 0.2 mg/dL   Indirect Bilirubin 4.8 (H) 0.3 - 0.9 mg/dL  Magnesium     Status: None   Collection Time: 08/15/19  6:32 AM  Result Value Ref Range   Magnesium 2.3 1.7 - 2.4 mg/dL  Phosphorus     Status: None   Collection Time: 08/15/19  6:32 AM  Result Value Ref Range   Phosphorus UNABLE TO REPORT DUE TO ICTERUS 2.5 - 4.6 mg/dL  Ammonia     Status: None  Collection Time: 08/15/19  6:32 AM  Result Value Ref Range   Ammonia 33 9 - 35 umol/L     Recent Labs    08/13/19 0607 08/14/19 0432 08/15/19 0632  WBC 11.8* 14.5* 13.5*  HGB 10.4* 8.7* 8.8*  HCT 29.1* 25.0* 24.4*  PLT 85* 120* 138*   BMET Recent Labs    08/13/19 1202 08/14/19 0432 08/15/19 0632  NA 141 146* 150*  K 4.5 3.6 3.5  CL 110 115* 121*  CO2 19* 22 23  GLUCOSE 147* 116* 95  BUN 12 14 17   CREATININE 0.62 0.49 0.62  CALCIUM 7.5* 7.4* 7.4*   LFT Recent Labs    08/15/19 0632  PROT 5.7*  ALBUMIN 2.2*  AST 237*  ALT 79*  ALKPHOS 292*  BILITOT 12.8*  BILIDIR 8.0*  IBILI 4.8*   PT/INR Recent Labs    08/12/19 1853  LABPROT 20.7*  INR 1.9*   Hepatitis Panel No results for input(s): HEPBSAG, HCVAB, HEPAIGM, HEPBIGM in the last 72 hours. C-Diff No results for input(s): CDIFFTOX in the last 72 hours. No results for input(s): CDIFFPCR in the last 72 hours.   Studies/Results: DG Abd 1 View  Result Date: 08/15/2019 CLINICAL DATA:  Small bowel obstruction. EXAM: ABDOMEN  - 1 VIEW COMPARISON:  08/13/2019 FINDINGS: An enteric tube remains in place with tip projecting over the gastric body. Overall, the degree of bowel distension has decreased from the prior study. There is persistent moderate gaseous distension of the transverse colon with milder gaseous distension of bowel loops elsewhere which may reflect a combination of small and large bowel. No acute osseous abnormality is seen. IMPRESSION: Decreased bowel distension. Electronically Signed   By: Logan Bores M.D.   On: 08/15/2019 12:37   CT ABDOMEN PELVIS W CONTRAST  Result Date: 08/15/2019 CLINICAL DATA:  Abdominal distension EXAM: CT ABDOMEN AND PELVIS WITH CONTRAST TECHNIQUE: Multidetector CT imaging of the abdomen and pelvis was performed using the standard protocol following bolus administration of intravenous contrast. CONTRAST:  21mL OMNIPAQUE IOHEXOL 300 MG/ML  SOLN COMPARISON:  08/10/2018 FINDINGS: Lower chest: Bibasilar atelectasis, right greater than left, which has slightly increased from prior. Trace pericardial effusion. Hepatobiliary: Marked hepatic steatosis. Nodular hepatic surface contour. Heterogeneous appearance of the liver parenchyma, similar to prior. Moderate distention of the gallbladder, which appears mildly thick-walled. No hyperdense gallstone evident. Pancreas: Unremarkable. No pancreatic ductal dilatation or surrounding inflammatory changes. Spleen: Spleen is mildly enlarged, unchanged from prior. Adrenals/Urinary Tract: Unremarkable adrenal glands. Subcentimeter cortically based low-density lesion in the lower pole the right kidney, too small to characterize. Right greater than left striated nephrograms, most evident on delayed phase imaging (for example series 7, image 21). Urinary bladder is mildly thick walled although under distended. Stomach/Bowel: Interval development of diffuse pancolonic wall thickening, most pronounced within the right colon and cecum. Numerous fluid-filled loops of  small bowel throughout the abdomen. The stomach and proximal small bowel is distended with oral contrast. No evidence of bowel obstruction. An enteric tube terminates within the stomach. Vascular/Lymphatic: Extensive upper abdominal and paraesophageal varices. Normal caliber aorta without aneurysm. No abdominopelvic lymphadenopathy identified. Reproductive: Uterus and bilateral adnexa are unremarkable. Other: Interval development of small volume ascites throughout the abdomen and pelvis. No well-defined or rim enhancing fluid collections. No pneumoperitoneum. Mild anasarca. Musculoskeletal: No acute or significant osseous findings. IMPRESSION: 1. Interval development of diffuse pancolonic wall thickening, most pronounced within the right colon and cecum. There also mildly thickened fluid-filled loops of small bowel throughout the  abdomen. Findings are suggestive of an enterocolitis, which may be infectious or inflammatory in etiology. No evidence of bowel obstruction. 2. Interval development of small volume ascites throughout the abdomen and pelvis. 3. Again noted are bilateral striated nephrograms, right greater than left, most evident on delayed phase imaging, suggesting pyelonephritis. Correlate with urinalysis. 4. Moderately distended gallbladder with suggestion of mild diffuse gallbladder wall thickening. Findings are nonspecific in the setting of concurrent hepatocellular disease. If there is clinical concern for cholecystitis, a right upper quadrant ultrasound could be performed. 5. Marked hepatic steatosis with findings of cirrhosis and portal hypertension. Heterogeneous appearance of the liver parenchyma, similar to prior study. 6. Bibasilar atelectasis, right greater than left, which has slightly increased from prior. 7. Trace pericardial effusion. Electronically Signed   By: Davina Poke D.O.   On: 08/15/2019 15:55   US Abdomen Limited  Result Date: 08/15/2019 CLINICAL DATA:  History of cirrhosis.  Please perform ascites search ultrasound ultrasound-guided paracentesis as indicated. EXAM: LIMITED ABDOMEN ULTRASOUND FOR ASCITES TECHNIQUE: Limited ultrasound survey for ascites was performed in all four abdominal quadrants. COMPARISON:  CT abdomen and pelvis-08/10/2019 FINDINGS: Sonographic evaluation of the abdomen demonstrates a trace amount of perihepatic (image 2) and free fluid within the left lower abdominal quadrant (image 6), too small to allow for safe ultrasound-guided paracentesis. As such, no paracentesis attempted. IMPRESSION: Trace amount of intra-abdominal ascites, too small to allow for safe ultrasound-guided paracentesis. Electronically Signed   By: Sandi Mariscal M.D.   On: 08/15/2019 11:49    Scheduled Inpatient Medications:   . sodium chloride   Intravenous Once  . Chlorhexidine Gluconate Cloth  6 each Topical Daily  . folic acid  1 mg Oral Daily  . lactulose  30 g Oral TID  . levothyroxine  75 mcg Oral Q0600  . midodrine  5 mg Oral Q8H  . thiamine  100 mg Oral Daily   Or  . thiamine  100 mg Intravenous Daily    Continuous Inpatient Infusions:   . ceFEPime (MAXIPIME) IV 2 g (08/15/19 1627)  . dextrose 50 mL/hr at 08/15/19 1308  . metronidazole 500 mg (08/15/19 1626)    PRN Inpatient Medications:  albuterol, ipratropium, LORazepam **OR** LORazepam, ondansetron **OR** ondansetron (ZOFRAN) IV    Assessment:  44 y/o Caucasian female with a PMH of alcoholic cirrhosis of the liver with known history of esophageal and gastric varices, SLE, Hx of CVA, HTN, Hx of seizures, anxiety and depression, and CKD who presented to the [facility] ED via EMS after being found lying on the floor intoxicated  1. Alcoholic cirrhosis with ascites - MELD 22, Child's Class C (10 pts). Repeat abdominal ultrasound today revealed only small amount of perihepatic ascites considered to small a volume to attempt diagnostic paracentesis.  2. Alcoholic hepatitis - Maddrey DF 34  3. Coagulopathic -  INR 1.8, platelets 26K  4. Hematemesis - concern for UGI bleed given known hx of esophageal varices, she is appropriately on IV Protonix and octreotide, hemodynamically stable with no evidence of active GI bleeding  5. Pyelonephritis - on IV antibiotics, primary team following. Low grade fever this morning.  6. Transaminitis - From alcohol toxicity.   7. DNR status per family.  8. Cecal volvulus - Repeat CT performed today did not seem to reveal a cecal volvulus, mainly right colonic inflammation/enteritis.   9. Severe encephalopathy - Multifactorial, mainly due to liver disease, possible sepsis.  Plan:  1. Continue current measures. 2. Poor prognosis. 3. I discussed the case  with patient's father, female friend, other family. They are aware of the poor prognosis but advised patient is stable, though very sick at this time. They still advocate for comfort care only. They had some other questions about disposition and prognosis and so I referred them to Dr. Dwyane Dee and Palliative care team. 4. I will continue to follow.   Jerusalen Mateja K. Alice Reichert, M.D. 08/15/2019, 4:32 PM

## 2019-08-15 NOTE — Progress Notes (Signed)
PROGRESS NOTE    Chloe [name]  UJW:[id number] DOB: Jul 08, 1975 DOA: 08/09/2019 PCP: [facility]   Brief Narrative:  Patient was brought in from home a by EMS after she was found fallen down on the floor by her father. Patient has history of chronic alcoholism with significant liver disorder. She reports drinking a glass of wine and a glass of vodka on a daily basis. She has been vomiting bright red blood mixed with coffee ground emesis few days ago. Hasn't vomited lately.   Developed high-grade fever with tachycardia.  Started on Rocephin for E. coli UTI.  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  So GI will not proceed with scope at this time due to high risk than benefit.  Subjective: No significant overnight events, patient still remains very lethargic and obtunded.  As per RN patient was able to wake up and tell her name but on my exam she was sleepy and was not arousable.  Still has mild acidotic breathing and distended abdomen with NG tube intake. As per palliative care discussion with the family we will not do aggressive management but we will treat medically only to some extent/treatable conditions. Family does not want comfort measures/hospice care at this time.  Assessment & Plan:   Active Problems:   Upper GI bleed   Lower urinary tract infectious disease   Acute liver failure without hepatic coma   Acute respiratory failure with hypoxia (HCC)   Metabolic acidemia   Acute toxic/metabolic encephalopathy, due to alcohol abuse/metabolic acidosis and high ammonia level Continue to monitor mental status   Alcohol use disorder with intoxication, alcoholic liver disease MELDscore of 29 with electrolyte derangement -Maddery score 34 -GI consultation with Dr Estil Daft on hold due to high grade fever--plan was to get it done over the weekend, patient became critically ill.  so EGD will  not be done at this time -Correct electrolytes -Monitor LFTs and INR -held Lasix and spironolactone due to low BP -CIWA protocol with banana bag -Seizure and fall precautions -Social work consultation for discussion of detox and rehabilitation options --6/12 patient's condition became more critical, transferred to ICU, CODE STATUS was changed to DNR as per discussion of ICU attending with the family. -Palliative care consulted and discussed with the family regarding goals of care and management, and family decided to treat treatable conditions, no aggressive management and no comfort measures/hospice care at this time.  Palliative care will follow with the family again in 1 to 2 days  -6/12 NG tube inserted for feeding and medications, nutrition is consulted -Continue lactulose and monitor ammonia level daily Plan is to start NG tube feeding and free water after r/o obstruction, cecal volvulus [place]: Trace amount of intra-abdominal ascites, too small to allow for safe ultrasound-guided paracentesis.      UpperGI bleed- H&H stable -- patient likely has variceal bleed given history of alcoholic cirrhosis of liver. -s/p IV Protonix and octreotide drip- for total of 72 hours. -Serial CBCs--stable - G.I. consulted, EGD was planned but could not be done for now as patient may have cecal volvulus and abdominal distention, so risk of scope is higher than benefit.  H&H is stable -Holding anticoagulants   Acquired coagulopathy secondary to liver cirrhosis -low platelet count, elevated PT/INR --Transfuse FFP if any signs of bleeding.  Sepsis due to UTI/acute pyelonephritis as noted on CT abdomen--POA -s/p IV Rocephin -6/12 started vancomycin, cefepime and Flagyl,  started in the ICU because patient's condition deteriorated -UC-pansensitive ecoli  Hypophosphatemia Phosphorus repleted.  Continue to monitor.  Hypomagnesemia, magnesium repleted.  Hypernatremia secondary to dehydration We  will start free water with NG tube after ruling out obstruction Continue D5 in the meantime  History of depression/anxiety -was on Cymbalta, held for now  History of hypothyroidism -Continue Synthroid 75 mcg    6/12 patient was in metabolic acidosis, tachypneic and tachycardic with abdominal distention ABG was consistent with metabolic acidosis Patient was given 2 Amps of bicarb IV push, NG tube was inserted for feeding and medications as patient was not able to take anything p.o. About treatment continued Abdominal x-ray was ordered by critical care which showed abdominal distention and possibility of cecal volvulus and recommended CT scan of abdomen which I informed to critical care attending Rapid response RN was called and patient was transferred to ICU/critical care for further management. CODE STATUS was changed in the ICU to DNR due to poor prognosis, and patient was transferred back to medical floor for further management. Very poor prognosis, patient's condition granddaughter rated any time.  Palliative care is following and discussed with the family.  Continue current treatment and no comfort measures/hospice at this time.     Objective: Vitals:   08/15/19 0408 08/15/19 0758 08/15/19 1011 08/15/19 1154  BP: 108/66 98/65 102/64 101/67  Pulse: 85 79 82 79  Resp: 19 16 16 16   Temp: 97.8 F (36.6 C) 97.8 F (36.6 C) 98.1 F (36.7 C) 97.9 F (36.6 C)  TempSrc:  Oral    SpO2: 99% 95% 94% 95%  Weight:      Height:        Intake/Output Summary (Last 24 hours) at 08/15/2019 1155 Last data filed at 08/15/2019 1100 Gross per 24 hour  Intake 3285.11 ml  Output 700 ml  Net 2585.11 ml   Filed Weights   08/09/19 1618 08/10/19 2127  Weight: 57.6 kg 61.8 kg    Examination:  General exam: Very lethargic, obtunded, unable to arouse.  Respiratory system: tachypneic, conducting sounds bilateral Cardiovascular system: S1 & S2 heard, RRR. No JVD, murmurs, rubs, gallops or  clicks. Gastrointestinal system:  Firm and distended, bowel sounds positive. Central nervous system: Obtunded, unarousable  Extremities: 1-2+ edema, no cyanosis, pulses intact and symmetrical, Psychiatry: Unable to assess as patient is very lethargic.    DVT prophylaxis: SCDs. Code Status: DNR Family Communication: None available at bedside  Disposition Plan:  Status is: Inpatient  Remains inpatient appropriate because:Inpatient level of care appropriate due to severity of illness   Dispo: The patient is from: Home              Anticipated d/c is to: Home              Anticipated d/c date is: > 3 days              Patient currently is not medically stable to d/c.  Consultants:   GI  Critical care  Procedures:  Antimicrobials:  D/c'd on 6/12 Rocephin 6/12  vancomycin, cefepime, metronidazole   Data Reviewed: I have personally reviewed following labs and imaging studies  CBC: Recent Labs  Lab 08/10/19 2125 08/12/19 0538 08/13/19 0607 08/14/19 0432 08/15/19 0632  WBC 5.0 6.9 11.8* 14.5* 13.5*  HGB 10.0* 9.9* 10.4* 8.7* 8.8*  HCT 27.9* 27.9* 29.1* 25.0* 24.4*  MCV 95.9 95.5 93.3 96.2 94.9  PLT 30* 42* 85* 120* 387*   Basic Metabolic Panel: Recent Labs  Lab 08/12/19 0538 08/13/19 0607 08/13/19 1202 08/14/19 0432 08/15/19 0632  NA 134* 139 141 146* 150*  K 3.5 4.3 4.5 3.6 3.5  CL 104 111 110 115* 121*  CO2 21* 19* 19* 22 23  GLUCOSE 183* 123* 147* 116* 95  BUN 11 9 12 14 17   CREATININE 0.55 0.44 0.62 0.49 0.62  CALCIUM 7.9* 7.7* 7.5* 7.4* 7.4*  MG 2.1 1.5* 1.5* 1.8 2.3  PHOS <1.0* UNABLE TO REPORT DUE TO ICTERUS UNABLE TO REPORT DUE TO ICTERUS 1.6* UNABLE TO REPORT DUE TO ICTERUS   GFR: Estimated Creatinine Clearance: 78.4 mL/min (by C-G formula based on SCr of 0.62 mg/dL). Liver Function Tests: Recent Labs  Lab 08/11/19 0408 08/12/19 0538 08/12/19 1853 08/13/19 5366 08/13/19 1202 08/14/19 0432 08/15/19 0632  AST 189*  --  203*  --  305* 313*  237*  ALT 56*  --  68*  --  84* 88* 79*  ALKPHOS 111  --  131*  --  222* 251* 292*  BILITOT 7.9*  --  11.6*  --  12.9* 11.7* 12.8*  PROT 5.8*  --  6.1*  --  6.0* 5.5* 5.7*  ALBUMIN 2.4*   < > 2.5* 2.5* 2.5* 2.2* 2.2*   < > = values in this interval not displayed.   No results for input(s): LIPASE, AMYLASE in the last 168 hours. Recent Labs  Lab 08/09/19 2057 08/12/19 1853 08/13/19 0813 08/14/19 0432 08/15/19 0632  AMMONIA 31 45* 54* 49* 33   Coagulation Profile: Recent Labs  Lab 08/09/19 1624 08/10/19 0612 08/12/19 1853  INR 1.8* 1.8* 1.9*   Cardiac Enzymes: No results for input(s): CKTOTAL, CKMB, CKMBINDEX, TROPONINI in the last 168 hours. BNP (last 3 results) No results for input(s): PROBNP in the last 8760 hours. HbA1C: No results for input(s): HGBA1C in the last 72 hours. CBG: Recent Labs  Lab 08/13/19 1152 08/13/19 1218 08/13/19 2357 08/14/19 0404 08/14/19 1150  GLUCAP 130* 137* 117* 116* 97   Lipid Profile: No results for input(s): CHOL, HDL, LDLCALC, TRIG, CHOLHDL, LDLDIRECT in the last 72 hours. Thyroid Function Tests: No results for input(s): TSH, T4TOTAL, FREET4, T3FREE, THYROIDAB in the last 72 hours. Anemia Panel: No results for input(s): VITAMINB12, FOLATE, FERRITIN, TIBC, IRON, RETICCTPCT in the last 72 hours. Sepsis Labs: Recent Labs  Lab 08/10/19 0555 08/10/19 1114 08/11/19 0408 08/12/19 0538  PROCALCITON 4.18  --  2.78 1.98  LATICACIDVEN 1.2 1.1  --   --     Recent Results (from the past 240 hour(s))  Urine Culture     Status: Abnormal   Collection Time: 08/09/19  5:27 PM   Specimen: Urine, Random  Result Value Ref Range Status   Specimen Description   Final    URINE, RANDOM Performed at [facility], [address] [place], [place] [address]    Special Requests   Final    NONE Performed at [facility], [address] [place], [place] [address]    Culture >=100,000 COLONIES/mL ESCHERICHIA COLI (A)  Final     Report Status 08/11/2019 FINAL  Final   Organism ID, Bacteria ESCHERICHIA COLI (A)  Final      Susceptibility   Escherichia coli - MIC*    AMPICILLIN <=2 SENSITIVE Sensitive     CEFAZOLIN <=4 SENSITIVE Sensitive     CEFTRIAXONE <=1 SENSITIVE Sensitive     CIPROFLOXACIN <=0.25 SENSITIVE Sensitive     GENTAMICIN <=1 SENSITIVE Sensitive     IMIPENEM <=0.25 SENSITIVE  Sensitive     NITROFURANTOIN <=16 SENSITIVE Sensitive     TRIMETH/SULFA <=20 SENSITIVE Sensitive     AMPICILLIN/SULBACTAM <=2 SENSITIVE Sensitive     PIP/TAZO <=4 SENSITIVE Sensitive     * >=100,000 COLONIES/mL ESCHERICHIA COLI  SARS Coronavirus 2 by RT PCR (hospital order, performed in [facility] hospital lab) Nasopharyngeal Nasopharyngeal Swab     Status: None   Collection Time: 08/09/19  7:30 PM   Specimen: Nasopharyngeal Swab  Result Value Ref Range Status   SARS Coronavirus 2 NEGATIVE NEGATIVE Final    Comment: (NOTE) SARS-CoV-2 target nucleic acids are NOT DETECTED. The SARS-CoV-2 RNA is generally detectable in upper and lower respiratory specimens during the acute phase of infection. The lowest concentration of SARS-CoV-2 viral copies this assay can detect is 250 copies / mL. A negative result does not preclude SARS-CoV-2 infection and should not be used as the sole basis for treatment or other patient management decisions.  A negative result may occur with improper specimen collection / handling, submission of specimen other than nasopharyngeal swab, presence of viral mutation(s) within the areas targeted by this assay, and inadequate number of viral copies (<250 copies / mL). A negative result must be combined with clinical observations, patient history, and epidemiological information. Fact Sheet for Patients:   StrictlyIdeas.no Fact Sheet for Healthcare Providers: BankingDealers.co.za This test is not yet approved or cleared  by the [place] FDA and has  been authorized for detection and/or diagnosis of SARS-CoV-2 by FDA under an Emergency Use Authorization (EUA).  This EUA will remain in effect (meaning this test can be used) for the duration of the COVID-19 declaration under Section 564(b)(1) of the Act, 21 U.S.C. section 360bbb-3(b)(1), unless the authorization is terminated or revoked sooner. Performed at [facility], [address] [place], [place] [address]   MRSA PCR Screening     Status: None   Collection Time: 08/13/19  1:09 PM   Specimen: Nasopharyngeal  Result Value Ref Range Status   MRSA by PCR NEGATIVE NEGATIVE Final    Comment:        The GeneXpert MRSA Assay (FDA approved for NASAL specimens only), is one component of a comprehensive MRSA colonization surveillance program. It is not intended to diagnose MRSA infection nor to guide or monitor treatment for MRSA infections. Performed at [facility], [address] [place], [place] [address]      Radiology Studies: DG Abd 1 View  Result Date: 08/13/2019 CLINICAL DATA:  Nasogastric tube placement EXAM: ABDOMEN - 1 VIEW COMPARISON:  CT abdomen and pelvis August 10, 2019 FINDINGS: Nasogastric tube tip and side port are in the stomach. Loops of bowel dilatation remain. Note that the cecum is now distended and oriented perpendicular to the lumbar spine, a change from recent CT. No free air. IMPRESSION: Nasogastric tube tip and side port in stomach. Persistent bowel dilatation. Note that there is now dilatation of the cecum with cecum now perpendicular to the lumbar spine. Question early cecal volvulus. This finding may warrant correlation with abdominal CT to further evaluate. No free air. These results will be called to the ordering clinician or representative by the Radiologist Assistant, and communication documented in the PACS or [employer]. Electronically Signed   By: Lowella Grip III M.D.   On: 08/13/2019 13:41   US Abdomen  Limited  Result Date: 08/15/2019 CLINICAL DATA:  History of cirrhosis. Please perform ascites search ultrasound ultrasound-guided paracentesis as indicated. EXAM: LIMITED ABDOMEN ULTRASOUND FOR ASCITES  TECHNIQUE: Limited ultrasound survey for ascites was performed in all four abdominal quadrants. COMPARISON:  CT abdomen and pelvis-08/10/2019 FINDINGS: Sonographic evaluation of the abdomen demonstrates a trace amount of perihepatic (image 2) and free fluid within the left lower abdominal quadrant (image 6), too small to allow for safe ultrasound-guided paracentesis. As such, no paracentesis attempted. IMPRESSION: Trace amount of intra-abdominal ascites, too small to allow for safe ultrasound-guided paracentesis. Electronically Signed   By: Sandi Mariscal M.D.   On: 08/15/2019 11:49    Scheduled Meds:  sodium chloride   Intravenous Once   Chlorhexidine Gluconate Cloth  6 each Topical Daily   folic acid  1 mg Oral Daily   lactulose  30 g Oral TID   levothyroxine  75 mcg Oral Q0600   thiamine  100 mg Oral Daily   Or   thiamine  100 mg Intravenous Daily   Continuous Infusions:  sodium chloride 125 mL/hr at 08/14/19 0456   ceFEPime (MAXIPIME) IV 2 g (08/15/19 0657)   metronidazole 500 mg (08/15/19 0656)   vancomycin 750 mg (08/15/19 0959)     LOS: 6 days   Time spent: 40 minutes.  Val Riles, MD [facility]  If 7PM-7AM, please contact night-coverage [email or web address]  08/15/2019, 11:55 AM   This record has been created using Systems analyst. Errors have been sought and corrected,but may not always be located. Such creation errors do not reflect on the standard of care.

## 2019-08-15 NOTE — Progress Notes (Signed)
   08/15/19 1330  Clinical Encounter Type  Visited With Patient  Visit Type Follow-up;Spiritual support;Social support  Referral From Chaplain  Consult/Referral To Chaplain  Ch followed-up with Pt. Pt was doing better. Pt could not talk, Ch talk to a family member. Ch will follow-up with Pt.

## 2019-08-15 NOTE — Consult Note (Signed)
Consultation Note Date: 08/15/2019   Patient Name: Chloe [name]  DOB: 01-18-1976  MRN: [id number]  Age / Sex: 44 y.o., female  PCP: [facility], [place] Referring Physician: Val Riles, MD  Reason for Consultation: Establishing goals of care and Psychosocial/spiritual support  HPI/Patient Profile: 44 y.o. female admitted on 08/09/2019 with known history of alcohol use disorder with withdrawal syndrome, CKD, anxiety/depression, hypertension, lupus, seizures presents to the emergency department for evaluation of alcohol intoxication.  Last drink was this morning.  She was reportedly found on the floor intoxicated this morning by her father with bruising to multiple areas of her body.  Patient's father brought her to the emergency department today for help with her alcoholism.  Upon further questioning the patient reports that she did vomit blood 3 days ago but has not had any hematemesis since.  Of note February 2021 she was admitted to [facility] for evaluation of liver failure  EMS/ED COURSE: Patient received IV fluids, Ativan, DuoNeb, Rocephin, folic acid, thiamine, multivitamin, Protonix drip, Sandostatin.  Medical admission was requested for alcoholic liver failure, upper GI bleed, UTI  Today is day 6 of the hospitilization.  Patient remains encephalopathic with long term poor prognosis.  Family face treatment option decisions, advanced directive decisions and anticipatory ca re needs.    Clinical Assessment and Goals of Care:   This NP Wadie Lessen reviewed medical records, received report from team, assessed the patient and then meet at the patient's bedside with her father and sister to discuss diagnosis, prognosis, GOC, EOL wishes disposition and options.   Concept of Palliative Care was introduced as specialized medical care for people and their families living with serious illness.  If focuses on  providing relief from the symptoms and stress of a serious illness.  The goal is to improve quality of life for both the patient and the family.  Created space and opportunity for family to explore their thoughts and feelings regarding patient's current medical situation. Patient has struggled with addiction for many years.  According to her family she has had opportunity for rehab which she did not accepted.  Family feels that patient has kept of her life secret from them and is pushing them away.  They do not really know the people in her life currently. The verbalize the seriousness of her alcohol abuse    A  discussion was had today regarding advanced directives.  Concepts specific to code status, artifical feeding and hydration, continued IV antibiotics and rehospitalization was had.  The difference between a aggressive medical intervention path  and a palliative comfort care path for this patient at this time was had.  Values and goals of care important to patient and family were attempted to be elicited.   Father and sister "just want to be sure" they are making the right decisions for Chloe [name].  They see her as "better" today.  They are asking for GI to weight in again on her prognosis before making a decision to shift to a full comfort path.  Natural trajectory and expectations at EOL were discussed.  Questions and concerns addressed.  Family is  encouraged to call with questions or concerns.     PMT will continue to support holistically.  F/U meeting is planned for Wednesday at 11:00    NEXT OF KIN/father and sister/Kelly.  No documented HPOA or AD     SUMMARY OF RECOMMENDATIONS    Code Status/Advance Care Planning:  DNR    Palliative Prophylaxis:   Aspiration, Bowel Regimen, Delirium Protocol, Frequent Pain Assessment and Oral Care  Additional Recommendations (Limitations, Scope, Preferences):  Continue current treatments over the next 24-48 hours as family members process and  make decisions for a shift to full comfort,which would detail as no  no life prolong measures, allowing for a natural death.  This is a very difficult decision for the family     Psycho-social/Spiritual:   Desire for further Chaplaincy support:yes  Additional Recommendations: Education on Hospice   Created space and opportunity for family to explore the thoughts and feelings regarding patient's current medical situation.  This is been an ongoing difficult situation for the family secondary to the disease of addiction.  It is very hard for her father to be placed in the situation to make decisions for his daughter.  "I have been trying to help her as best I could for a long time"  Prognosis:   Poor prognosis, will be affected for desire for life prolonging measures  Discharge Planning: To Be Determined      Primary Diagnoses: Present on Admission: . Upper GI bleed   I have reviewed the medical record, interviewed the patient and family, and examined the patient. The following aspects are pertinent.  Past Medical History:  Diagnosis Date  . Alcohol use disorder   . Alcohol withdrawal delirium ([place])   . Anxiety   . 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 Chronic kidney disease    rheumatologist follows her, lasix for this  . Depression   . Hypertension   . Lupus ([place])   . Raynaud's phenomenon   . Seizures ([place])   . Steatosis of liver    Social History   Socioeconomic History  . Marital status: Single    Spouse name: Not on file  . Number of children: Not on file  . Years of education: Not on file  . Highest education level: Not on file  Occupational History  . Not on file  Tobacco Use  . Smoking status: Never Smoker  . Smokeless tobacco: Never Used  Substance and Sexual Activity  . Alcohol use: Yes    Comment: 2-3 times a week  . Drug use: No  . Sexual activity: Yes    Birth control/protection: Pill  Other Topics Concern  . Not on file  Social History  Narrative   Lives alone in an apartment. Boyfriend lives in the same apartment complex.   Social Determinants of Health   Financial Resource Strain:   . Difficulty of Paying Living Expenses:   Food Insecurity:   . Worried About Charity fundraiser in the Last Year:   . Arboriculturist in the Last Year:   Transportation Needs:   . Film/video editor (Medical):   Marland Kitchen Lack of Transportation (Non-Medical):   Physical Activity:   . Days of Exercise per Week:   . Minutes of Exercise per Session:   Stress:   . Feeling of Stress :   Social Connections:   .  Frequency of Communication with Friends and Family:   . Frequency of Social Gatherings with Friends and Family:   . Attends Religious Services:   . Active Member of Clubs or Organizations:   . Attends Archivist Meetings:   Marland Kitchen Marital Status:    Family History  Problem Relation Age of Onset  . Alzheimer's disease Maternal Grandfather   . Diabetes Paternal Grandmother    Scheduled Meds: . sodium chloride   Intravenous Once  . Chlorhexidine Gluconate Cloth  6 each Topical Daily  . folic acid  1 mg Oral Daily  . lactulose  30 g Oral TID  . levothyroxine  75 mcg Oral Q0600  . thiamine  100 mg Oral Daily   Or  . thiamine  100 mg Intravenous Daily   Continuous Infusions: . sodium chloride 125 mL/hr at 08/14/19 0456  . ceFEPime (MAXIPIME) IV 2 g (08/15/19 0657)  . metronidazole 500 mg (08/15/19 0656)  . vancomycin Stopped (08/14/19 2120)   PRN Meds:.albuterol, ipratropium, LORazepam **OR** LORazepam, ondansetron **OR** ondansetron (ZOFRAN) IV Medications Prior to Admission:  Prior to Admission medications   Medication Sig Start Date End Date Taking? Authorizing Provider  DULoxetine (CYMBALTA) 20 MG capsule Take 20 mg by mouth daily. 05/30/19  Yes [provider]  folic acid (FOLVITE) 1 MG tablet Take 1 tablet (1 mg total) by mouth daily. 12/03/15  Yes Hower, Aaron Mose, MD  furosemide (LASIX) 40 MG tablet Take  40 mg by mouth daily.    Yes [provider]  gabapentin (NEURONTIN) 300 MG capsule Take 300 mg by mouth 3 (three) times daily.   Yes [provider]  levothyroxine (SYNTHROID) 75 MCG tablet Take 75 mcg by mouth daily. 05/25/19  Yes [provider]  ondansetron (ZOFRAN) 4 MG tablet Take 4 mg by mouth every 8 (eight) hours as needed for nausea/vomiting. 08/05/19  Yes [provider]  pantoprazole (PROTONIX) 40 MG tablet Take 40 mg by mouth daily. 08/08/19  Yes [provider]  prenatal vitamin w/FE, FA (PRENATAL 1 + 1) 27-1 MG TABS tablet Take 1 tablet by mouth daily at 12 noon.   Yes [provider]  spironolactone (ALDACTONE) 25 MG tablet Take 25 mg by mouth 2 (two) times daily. 06/03/19  Yes [provider]   Allergies  Allergen Reactions  . Bee Venom     Shortness of breath  . Sumatriptan Anaphylaxis   Review of Systems  Unable to perform ROS: Acuity of condition    Physical Exam Constitutional:      Appearance: She is ill-appearing.  Cardiovascular:     Rate and Rhythm: Normal rate.  Skin:    General: Skin is cool.  Neurological:     Mental Status: She is lethargic.     Vital Signs: BP 98/65 (BP Location: Left Arm)   Pulse 79   Temp 97.8 F (36.6 C) (Oral)   Resp 16   Ht 5\' 2"  (1.575 m)   Wt 61.8 kg   SpO2 95%   BMI 24.91 kg/m  Pain Scale: 0-10 POSS *See Group Information*: 1-Acceptable,Awake and alert Pain Score: 0-No pain   SpO2: SpO2: 95 % O2 Device:SpO2: 95 % O2 Flow Rate: .O2 Flow Rate (L/min): 2 L/min  IO: Intake/output summary:   Intake/Output Summary (Last 24 hours) at 08/15/2019 0842 Last data filed at 08/15/2019 0500 Gross per 24 hour  Intake 3195.11 ml  Output 600 ml  Net 2595.11 ml    LBM: Last BM Date:  08/14/19 Baseline Weight: Weight: 57.6 kg Most recent weight: Weight: 61.8 kg     Palliative Assessment/Data: 30 %   Discussed with Dr. Dwyane Dee via secure chat  Time In: 0900 Time  Out: 1010 Time Total: 70 minutes Greater than 50%  of this time was spent counseling and coordinating care related to the above assessment and plan.  Signed by: Wadie Lessen, NP   Please contact Palliative Medicine Team phone at [phone] for questions and concerns.  For individual provider: See Shea Evans

## 2019-08-15 NOTE — Consult Note (Addendum)
[place] for Electrolyte Monitoring and Replacement   Recent Labs: Potassium (mmol/L)  Date Value  08/15/2019 3.5   Magnesium (mg/dL)  Date Value  08/15/2019 2.3   Calcium (mg/dL)  Date Value  08/15/2019 7.4 (L)   Albumin (g/dL)  Date Value  08/15/2019 2.2 (L)   Phosphorus (mg/dL)  Date Value  08/15/2019 UNABLE TO REPORT DUE TO ICTERUS   Sodium (mmol/L)  Date Value  08/15/2019 150 (H)   Corrected Ca: 9.2 mg/dL  Assessment: 44 y.o.femalewith a known history of alcohol use disorder with withdrawal syndrome, CKD, anxiety/depression, hypertension, lupus, seizures presents to the emergency department for evaluation of alcohol intoxication.   MIVF: 0.9% NaCl 125 mL/hr - will change to D5W @ 61ml/hr per discussion with Dr Dwyane Dee  Goal of Therapy:  Potassium 4.0 - 5.1 mmol/L Magnesium 2.0 - 2.4 mg/dL All Other Electrolytes WNL  Plan:   IV potassium chloride 66meq x 2  F/u electrolytes at 6/15 am and replace as needed  Phos indeterminate - will recheck  Lu Duffel ,PharmD Clinical Pharmacist 08/15/2019 12:01 PM

## 2019-08-16 LAB — HEPATIC FUNCTION PANEL
ALT: 72 U/L — ABNORMAL HIGH (ref 0–44)
AST: 204 U/L — ABNORMAL HIGH (ref 15–41)
Albumin: 2.1 g/dL — ABNORMAL LOW (ref 3.5–5.0)
Alkaline Phosphatase: 304 U/L — ABNORMAL HIGH (ref 38–126)
Bilirubin, Direct: 9 mg/dL — ABNORMAL HIGH (ref 0.0–0.2)
Indirect Bilirubin: 5.5 mg/dL — ABNORMAL HIGH (ref 0.3–0.9)
Total Bilirubin: 14.5 mg/dL — ABNORMAL HIGH (ref 0.3–1.2)
Total Protein: 5.7 g/dL — ABNORMAL LOW (ref 6.5–8.1)

## 2019-08-16 LAB — CBC
HCT: 26.4 % — ABNORMAL LOW (ref 36.0–46.0)
Hemoglobin: 9.1 g/dL — ABNORMAL LOW (ref 12.0–15.0)
MCH: 32.9 pg (ref 26.0–34.0)
MCHC: 34.5 g/dL (ref 30.0–36.0)
MCV: 95.3 fL (ref 80.0–100.0)
Platelets: 162 10*3/uL (ref 150–400)
RBC: 2.77 MIL/uL — ABNORMAL LOW (ref 3.87–5.11)
RDW: 17.2 % — ABNORMAL HIGH (ref 11.5–15.5)
WBC: 13 10*3/uL — ABNORMAL HIGH (ref 4.0–10.5)
nRBC: 1 % — ABNORMAL HIGH (ref 0.0–0.2)

## 2019-08-16 LAB — BASIC METABOLIC PANEL
Anion gap: 8 (ref 5–15)
BUN: 15 mg/dL (ref 6–20)
CO2: 19 mmol/L — ABNORMAL LOW (ref 22–32)
Calcium: 7.6 mg/dL — ABNORMAL LOW (ref 8.9–10.3)
Chloride: 119 mmol/L — ABNORMAL HIGH (ref 98–111)
Creatinine, Ser: 0.45 mg/dL (ref 0.44–1.00)
GFR calc Af Amer: >60 mL/min (ref >60)
GFR calc non Af Amer: >60 mL/min (ref >60)
Glucose, Bld: 97 mg/dL (ref 70–99)
Potassium: 3.4 mmol/L — ABNORMAL LOW (ref 3.5–5.1)
Sodium: 146 mmol/L — ABNORMAL HIGH (ref 135–145)

## 2019-08-16 LAB — PROTIME-INR
INR: 2.1 — ABNORMAL HIGH (ref 0.8–1.2)
Prothrombin Time: 22.6 seconds — ABNORMAL HIGH (ref 11.4–15.2)

## 2019-08-16 LAB — GLUCOSE, CAPILLARY
Glucose-Capillary: 82 mg/dL (ref 70–99)
Glucose-Capillary: 107 mg/dL — ABNORMAL HIGH (ref 70–99)

## 2019-08-16 LAB — PHOSPHORUS: Phosphorus: UNDETERMINED mg/dL (ref 2.5–4.6)

## 2019-08-16 LAB — AMMONIA: Ammonia: 44 umol/L — ABNORMAL HIGH (ref 9–35)

## 2019-08-16 LAB — MAGNESIUM: Magnesium: 1.9 mg/dL (ref 1.7–2.4)

## 2019-08-16 MED ORDER — POTASSIUM CHLORIDE 10 MEQ/100ML IV SOLN
10.0000 meq | INTRAVENOUS | Status: DC
  Administered 2019-08-16: 09:00:00 10 meq via INTRAVENOUS
  Filled 2019-08-16: qty 100

## 2019-08-16 MED ORDER — SODIUM CHLORIDE 0.9% FLUSH: 10.0000 mL | INTRAVENOUS | Status: DC | PRN

## 2019-08-16 MED ORDER — SODIUM CHLORIDE 0.9 % IV SOLN
2.0000 g | INTRAVENOUS | Status: DC
  Administered 2019-08-16: 17:00:00 2 g via INTRAVENOUS
  Filled 2019-08-16: qty 20
  Filled 2019-08-16: qty 2

## 2019-08-16 MED ORDER — LEVETIRACETAM 500 MG PO TABS
500.0000 mg | ORAL_TABLET | Freq: Two times a day (BID) | ORAL | Status: DC
  Administered 2019-08-16 – 2019-08-18 (×3): 500 mg via ORAL
  Filled 2019-08-16 (×6): qty 1

## 2019-08-16 MED ORDER — SODIUM CHLORIDE 0.9% FLUSH
10.0000 mL | Freq: Two times a day (BID) | INTRAVENOUS | Status: DC
  Administered 2019-08-16 – 2019-08-18 (×3): 10 mL

## 2019-08-16 MED ORDER — POTASSIUM PHOSPHATES 15 MMOLE/5ML IV SOLN
20.0000 mmol | Freq: Once | INTRAVENOUS | Status: AC
  Administered 2019-08-16: 09:00:00 20 mmol via INTRAVENOUS
  Filled 2019-08-16: qty 6.67

## 2019-08-16 NOTE — Progress Notes (Signed)
PROGRESS NOTE    Chloe [name]  PJK:[id number] DOB: [birth year] DOA: 08/09/2019 PCP: [place] [facility]   Brief Narrative:  Patient was brought in from home a by EMS after she was found fallen down on the floor by her father. Patient has history of chronic alcoholism with significant liver disorder. She reports drinking a glass of wine and a glass of vodka on a daily basis. She has been vomiting bright red blood mixed with coffee ground emesis few days ago. Hasn't vomited lately.   Developed high-grade fever with tachycardia.  Started on Rocephin for E. coli UTI.  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  So GI will not proceed with scope at this time due to high risk than benefit.  Subjective: 6/15 No overnight event, patient is more awake and alert today and able to answer questions and ask questions.  She is hungry and wants to eat.  Patient feels well but abdominal distention and diffuse tenderness.  Patient is passing gas.  Patient denies any chest pain or palpitations, no significant shortness of breath.   6/12 patient was in metabolic acidosis, tachypneic and tachycardic with abdominal distention ABG was consistent with metabolic acidosis Patient was given 2 Amps of bicarb IV push, NG tube was inserted for feeding and medications as patient was not able to take anything p.o. About treatment continued Abdominal x-ray was ordered by critical care which showed abdominal distention and possibility of cecal volvulus and recommended CT scan of abdomen which I informed to critical care attending Rapid response RN was called and patient was transferred to ICU/critical care for further management. CODE STATUS was changed in the ICU to DNR due to poor prognosis, and patient was transferred back to medical floor for further management.    Assessment & Plan:   Active Problems:   Upper GI bleed   Lower  urinary tract infectious disease   Acute liver failure without hepatic coma   Acute respiratory failure with hypoxia (HCC)   Metabolic acidemia   Palliative care by specialist   DNR (do not resuscitate)   Encephalopathy acute   Acute toxic/metabolic encephalopathy, due to alcohol abuse/metabolic acidosis and high ammonia level Continue to monitor mental status 6/15 significant improvement noticed in her mental status.  Alcohol use disorder with intoxication, alcoholic liver disease MELDscore of 29 with electrolyte derangement -Maddery score 34 -GI consultation with Dr Estil Daft on hold due to high grade fever--plan was to get it done over the weekend, patient became critically ill.  so EGD will not be done at this time -Correct electrolytes -Monitor LFTs and INR -held Lasix and spironolactone due to low BP -CIWA protocol with banana bag -Seizure and fall precautions -Social work consultation for discussion of detox and rehabilitation options --6/12 patient's condition became more critical, transferred to ICU, CODE STATUS was changed to DNR as per discussion of ICU attending with the family. -Palliative care consulted and discussed with the family regarding goals of care and management, and family decided to treat treatable conditions, no aggressive management and no comfort measures/hospice care at this time.  Palliative care will follow with the family again in 1 to 2 days  -6/12 NG tube inserted for feeding and medications, nutrition is consulted.  -Continue lactulose and monitor ammonia level daily [place]: Trace amount of intra-abdominal ascites, too small to allow for safe ultrasound-guided paracentesis. CT abdomen shows colitis, no volvulus or obstruction.  Remove NG  tube Start oral feeding, seen by SLP ruled out risk of aspiration      UpperGI bleed- H&H stable -- patient likely has variceal bleed given history of alcoholic cirrhosis of liver. -s/p IV Protonix and  octreotide drip- for total of 72 hours. -Serial CBCs--stable - G.I. consulted, EGD was planned but could not be done for now as patient may have cecal volvulus and abdominal distention, so risk of scope is higher than benefit.  H&H is stable -Holding anticoagulants   Acquired coagulopathy secondary to liver cirrhosis -low platelet count, elevated PT/INR --Transfuse FFP if any signs of bleeding.  Sepsis due to UTI/acute pyelonephritis as noted on CT abdomen--POA -s/p IV Rocephin, started Vanco cefepime Flagyl in the ICU because patient's condition deteriorated -UC-pansensitive ecoli -6/12 -614 vancomycin, 6/12-6/015 cefepime  -6/12 Flagyl, 6/15 ceftriaxone 1 g IV daily Plan is to de-escalate antibiotics and gradually  Hypophosphatemia Phosphorus repleted.  Continue to monitor.  Hypomagnesemia, magnesium repleted.  Hypernatremia secondary to dehydration Continue D5 in the meantime We will encourage for oral hydration as patient has started eating orally  History of depression/anxiety -was on Cymbalta, held for now  History of hypothyroidism -Continue Synthroid 75 mcg   Very poor prognosis, patient's condition can deteriorate at any time. Palliative care is following and discussed with the family.  Continue current treatment and no comfort measures/hospice at this time.     Objective: Vitals:   08/16/19 0358 08/16/19 0532 08/16/19 0812 08/16/19 1201  BP: 98/62 106/73 106/80 103/66  Pulse: 93 85 85 85  Resp: 20 (!) 22 16 16   Temp: 97.8 F (36.6 C) 97.8 F (36.6 C) 99.3 F (37.4 C) 97.8 F (36.6 C)  TempSrc: Oral Oral Oral Oral  SpO2: 90% 96% 96% 95%  Weight:      Height:        Intake/Output Summary (Last 24 hours) at 08/16/2019 1421 Last data filed at 08/16/2019 0945 Gross per 24 hour  Intake 178.23 ml  Output --  Net 178.23 ml   Filed Weights   08/09/19 1618 08/10/19 2127  Weight: 57.6 kg 61.8 kg    Examination:  General exam: Awake and alert  today, looks a little bit tired .  Respiratory system: Mild tachypnea, no wheezing or crackles  Cardiovascular system: S1 & S2 heard, RRR. No JVD, murmurs, rubs, gallops or clicks. Gastrointestinal system:  Firm and distended, diffusely tender, more on the left side, bowel sounds positive. Central nervous system: Alert and awake, AAO x4, following commands no focal deficit   Extremities: 1-2+ edema, no cyanosis, pulses intact and symmetrical, Psychiatry: Cooperative, no agitation..    DVT prophylaxis: SCDs. Code Status: DNR Family Communication: None available at bedside  Disposition Plan:  Status is: Inpatient  Remains inpatient appropriate because:Inpatient level of care appropriate due to severity of illness   Dispo: The patient is from: Home              Anticipated d/c is to: Home              Anticipated d/c date is: 2-3 days              Patient currently is not medically stable to d/c.  Consultants:    GI  Critical care  Procedures:  Antimicrobials:  D/c'd on 6/12 Rocephin 6/12  vancomycin, cefepime, metronidazole -6/12 -614 vancomycin,  -6/12-6/015 cefepime  -6/12 Flagyl,  -6/15 ceftriaxone 1 g IV daily   Data Reviewed: I have personally reviewed following labs and imaging  studies  CBC: Recent Labs  Lab 08/12/19 0538 08/13/19 0607 08/14/19 0432 08/15/19 0632 08/16/19 0448  WBC 6.9 11.8* 14.5* 13.5* 13.0*  HGB 9.9* 10.4* 8.7* 8.8* 9.1*  HCT 27.9* 29.1* 25.0* 24.4* 26.4*  MCV 95.5 93.3 96.2 94.9 95.3  PLT 42* 85* 120* 138* 846   Basic Metabolic Panel: Recent Labs  Lab 08/13/19 0607 08/13/19 1202 08/14/19 0432 08/15/19 0632 08/16/19 0448  NA 139 141 146* 150* 146*  K 4.3 4.5 3.6 3.5 3.4*  CL 111 110 115* 121* 119*  CO2 19* 19* 22 23 19*  GLUCOSE 123* 147* 116* 95 97  BUN 9 12 14 17 15   CREATININE 0.44 0.62 0.49 0.62 0.45  CALCIUM 7.7* 7.5* 7.4* 7.4* 7.6*  MG 1.5* 1.5* 1.8 2.3 1.9  PHOS UNABLE TO REPORT DUE TO ICTERUS UNABLE TO REPORT DUE  TO ICTERUS 1.6* UNABLE TO REPORT DUE TO ICTERUS NOT VALID   GFR: Estimated Creatinine Clearance: 78.4 mL/min (by C-G formula based on SCr of 0.45 mg/dL). Liver Function Tests: Recent Labs  Lab 08/12/19 1853 08/12/19 1853 08/13/19 9629 08/13/19 1202 08/14/19 0432 08/15/19 0632 08/16/19 0448  AST 203*  --   --  305* 313* 237* 204*  ALT 68*  --   --  84* 88* 79* 72*  ALKPHOS 131*  --   --  222* 251* 292* 304*  BILITOT 11.6*  --   --  12.9* 11.7* 12.8* 14.5*  PROT 6.1*  --   --  6.0* 5.5* 5.7* 5.7*  ALBUMIN 2.5*   < > 2.5* 2.5* 2.2* 2.2* 2.1*   < > = values in this interval not displayed.   No results for input(s): LIPASE, AMYLASE in the last 168 hours. Recent Labs  Lab 08/12/19 1853 08/13/19 0813 08/14/19 0432 08/15/19 0632 08/16/19 0448  AMMONIA 45* 54* 49* 33 44*   Coagulation Profile: Recent Labs  Lab 08/09/19 1624 08/10/19 0612 08/12/19 1853 08/16/19 0448  INR 1.8* 1.8* 1.9* 2.1*   Cardiac Enzymes: No results for input(s): CKTOTAL, CKMB, CKMBINDEX, TROPONINI in the last 168 hours. BNP (last 3 results) No results for input(s): PROBNP in the last 8760 hours. HbA1C: No results for input(s): HGBA1C in the last 72 hours. CBG: Recent Labs  Lab 08/14/19 0404 08/14/19 1150 08/15/19 1722 08/15/19 2329 08/16/19 0808  GLUCAP 116* 97 90 76 82   Lipid Profile: No results for input(s): CHOL, HDL, LDLCALC, TRIG, CHOLHDL, LDLDIRECT in the last 72 hours. Thyroid Function Tests: No results for input(s): TSH, T4TOTAL, FREET4, T3FREE, THYROIDAB in the last 72 hours. Anemia Panel: No results for input(s): VITAMINB12, FOLATE, FERRITIN, TIBC, IRON, RETICCTPCT in the last 72 hours. Sepsis Labs: Recent Labs  Lab 08/10/19 0555 08/10/19 1114 08/11/19 0408 08/12/19 0538  PROCALCITON 4.18  --  2.78 1.98  LATICACIDVEN 1.2 1.1  --   --     Recent Results (from the past 240 hour(s))  Urine Culture     Status: Abnormal   Collection Time: 08/09/19  5:27 PM   Specimen:  Urine, Random  Result Value Ref Range Status   Specimen Description   Final    URINE, RANDOM Performed at [facility], [address], [place], [place] [address]    Special Requests   Final    NONE Performed at [facility], [address] [place], [place] [address]    Culture >=100,000 COLONIES/mL ESCHERICHIA COLI (A)  Final   Report Status 08/11/2019 FINAL  Final   Organism ID, Bacteria  ESCHERICHIA COLI (A)  Final      Susceptibility   Escherichia coli - MIC*    AMPICILLIN <=2 SENSITIVE Sensitive     CEFAZOLIN <=4 SENSITIVE Sensitive     CEFTRIAXONE <=1 SENSITIVE Sensitive     CIPROFLOXACIN <=0.25 SENSITIVE Sensitive     GENTAMICIN <=1 SENSITIVE Sensitive     IMIPENEM <=0.25 SENSITIVE Sensitive     NITROFURANTOIN <=16 SENSITIVE Sensitive     TRIMETH/SULFA <=20 SENSITIVE Sensitive     AMPICILLIN/SULBACTAM <=2 SENSITIVE Sensitive     PIP/TAZO <=4 SENSITIVE Sensitive     * >=100,000 COLONIES/mL ESCHERICHIA COLI  SARS Coronavirus 2 by RT PCR (hospital order, performed in [place] hospital lab) Nasopharyngeal Nasopharyngeal Swab     Status: None   Collection Time: 08/09/19  7:30 PM   Specimen: Nasopharyngeal Swab  Result Value Ref Range Status   SARS Coronavirus 2 NEGATIVE NEGATIVE Final    Comment: (NOTE) SARS-CoV-2 target nucleic acids are NOT DETECTED. The SARS-CoV-2 RNA is generally detectable in upper and lower respiratory specimens during the acute phase of infection. The lowest concentration of SARS-CoV-2 viral copies this assay can detect is 250 copies / mL. A negative result does not preclude SARS-CoV-2 infection and should not be used as the sole basis for treatment or other patient management decisions.  A negative result may occur with improper specimen collection / handling, submission of specimen other than nasopharyngeal swab, presence of viral mutation(s) within the areas targeted by this assay, and inadequate number of viral  copies (<250 copies / mL). A negative result must be combined with clinical observations, patient history, and epidemiological information. Fact Sheet for Patients:   StrictlyIdeas.no Fact Sheet for Healthcare Providers: BankingDealers.co.za This test is not yet approved or cleared  by the [place] FDA and has been authorized for detection and/or diagnosis of SARS-CoV-2 by FDA under an Emergency Use Authorization (EUA).  This EUA will remain in effect (meaning this test can be used) for the duration of the COVID-19 declaration under Section 564(b)(1) of the Act, 21 U.S.C. section 360bbb-3(b)(1), unless the authorization is terminated or revoked sooner. Performed at [facility], [address] [place], [place] [address]   MRSA PCR Screening     Status: None   Collection Time: 08/13/19  1:09 PM   Specimen: Nasopharyngeal  Result Value Ref Range Status   MRSA by PCR NEGATIVE NEGATIVE Final    Comment:        The GeneXpert MRSA Assay (FDA approved for NASAL specimens only), is one component of a comprehensive MRSA colonization surveillance program. It is not intended to diagnose MRSA infection nor to guide or monitor treatment for MRSA infections. Performed at [facility], [address], [place], [place] [address]      Radiology Studies: DG Abd 1 View  Result Date: 08/15/2019 CLINICAL DATA:  Small bowel obstruction. EXAM: ABDOMEN - 1 VIEW COMPARISON:  08/13/2019 FINDINGS: An enteric tube remains in place with tip projecting over the gastric body. Overall, the degree of bowel distension has decreased from the prior study. There is persistent moderate gaseous distension of the transverse colon with milder gaseous distension of bowel loops elsewhere which may reflect a combination of small and large bowel. No acute osseous abnormality is seen. IMPRESSION: Decreased bowel distension. Electronically Signed   By:  Logan Bores M.D.   On: 08/15/2019 12:37   CT ABDOMEN PELVIS W CONTRAST  Result Date: 08/15/2019 CLINICAL DATA:  Abdominal distension EXAM: CT ABDOMEN  AND PELVIS WITH CONTRAST TECHNIQUE: Multidetector CT imaging of the abdomen and pelvis was performed using the standard protocol following bolus administration of intravenous contrast. CONTRAST:  66mL OMNIPAQUE IOHEXOL 300 MG/ML  SOLN COMPARISON:  08/10/2018 FINDINGS: Lower chest: Bibasilar atelectasis, right greater than left, which has slightly increased from prior. Trace pericardial effusion. Hepatobiliary: Marked hepatic steatosis. Nodular hepatic surface contour. Heterogeneous appearance of the liver parenchyma, similar to prior. Moderate distention of the gallbladder, which appears mildly thick-walled. No hyperdense gallstone evident. Pancreas: Unremarkable. No pancreatic ductal dilatation or surrounding inflammatory changes. Spleen: Spleen is mildly enlarged, unchanged from prior. Adrenals/Urinary Tract: Unremarkable adrenal glands. Subcentimeter cortically based low-density lesion in the lower pole the right kidney, too small to characterize. Right greater than left striated nephrograms, most evident on delayed phase imaging (for example series 7, image 21). Urinary bladder is mildly thick walled although under distended. Stomach/Bowel: Interval development of diffuse pancolonic wall thickening, most pronounced within the right colon and cecum. Numerous fluid-filled loops of small bowel throughout the abdomen. The stomach and proximal small bowel is distended with oral contrast. No evidence of bowel obstruction. An enteric tube terminates within the stomach. Vascular/Lymphatic: Extensive upper abdominal and paraesophageal varices. Normal caliber aorta without aneurysm. No abdominopelvic lymphadenopathy identified. Reproductive: Uterus and bilateral adnexa are unremarkable. Other: Interval development of small volume ascites throughout the abdomen and  pelvis. No well-defined or rim enhancing fluid collections. No pneumoperitoneum. Mild anasarca. Musculoskeletal: No acute or significant osseous findings. IMPRESSION: 1. Interval development of diffuse pancolonic wall thickening, most pronounced within the right colon and cecum. There also mildly thickened fluid-filled loops of small bowel throughout the abdomen. Findings are suggestive of an enterocolitis, which may be infectious or inflammatory in etiology. No evidence of bowel obstruction. 2. Interval development of small volume ascites throughout the abdomen and pelvis. 3. Again noted are bilateral striated nephrograms, right greater than left, most evident on delayed phase imaging, suggesting pyelonephritis. Correlate with urinalysis. 4. Moderately distended gallbladder with suggestion of mild diffuse gallbladder wall thickening. Findings are nonspecific in the setting of concurrent hepatocellular disease. If there is clinical concern for cholecystitis, a right upper quadrant ultrasound could be performed. 5. Marked hepatic steatosis with findings of cirrhosis and portal hypertension. Heterogeneous appearance of the liver parenchyma, similar to prior study. 6. Bibasilar atelectasis, right greater than left, which has slightly increased from prior. 7. Trace pericardial effusion. Electronically Signed   By: Davina Poke D.O.   On: 08/15/2019 15:55   US Abdomen Limited  Result Date: 08/15/2019 CLINICAL DATA:  History of cirrhosis. Please perform ascites search ultrasound ultrasound-guided paracentesis as indicated. EXAM: LIMITED ABDOMEN ULTRASOUND FOR ASCITES TECHNIQUE: Limited ultrasound survey for ascites was performed in all four abdominal quadrants. COMPARISON:  CT abdomen and pelvis-08/10/2019 FINDINGS: Sonographic evaluation of the abdomen demonstrates a trace amount of perihepatic (image 2) and free fluid within the left lower abdominal quadrant (image 6), too small to allow for safe  ultrasound-guided paracentesis. As such, no paracentesis attempted. IMPRESSION: Trace amount of intra-abdominal ascites, too small to allow for safe ultrasound-guided paracentesis. Electronically Signed   By: Sandi Mariscal M.D.   On: 08/15/2019 11:49    Scheduled Meds: . sodium chloride   Intravenous Once  . Chlorhexidine Gluconate Cloth  6 each Topical Daily  . folic acid  1 mg Oral Daily  . lactulose  30 g Oral TID  . levETIRAcetam  500 mg Oral BID  . levothyroxine  75 mcg Oral Q0600  . midodrine  5  mg Oral Q8H  . sodium chloride flush  10-40 mL Intracatheter Q12H  . thiamine  100 mg Oral Daily   Or  . thiamine  100 mg Intravenous Daily   Continuous Infusions: . cefTRIAXone (ROCEPHIN)  IV    . dextrose 50 mL/hr at 08/15/19 1308  . metronidazole 500 mg (08/16/19 0518)  . potassium PHOSPHATE IVPB (in mmol) 20 mmol (08/16/19 0847)     LOS: 7 days   Time spent: 40 minutes.  Val Riles, MD [facility]  If 7PM-7AM, please contact night-coverage [email or web address]  08/16/2019, 2:21 PM   This record has been created using Systems analyst. Errors have been sought and corrected,but may not always be located. Such creation errors do not reflect on the standard of care.

## 2019-08-16 NOTE — Progress Notes (Signed)
GI Inpatient Follow-up Note  Subjective:  Patient seen in follow-up for alcoholic hepatitis, ascites, hepatic encephalopathy. No acute events overnight. No overt hematemesis, hematochezia, or melena. No family at bedside tonight. Speech saw patient today and no increased risk of aspiration. She denies any chest pain, shortness of breath, chills, or abdominal pain.   Scheduled Inpatient Medications:  . sodium chloride   Intravenous Once  . Chlorhexidine Gluconate Cloth  6 each Topical Daily  . folic acid  1 mg Oral Daily  . lactulose  30 g Oral TID  . levETIRAcetam  500 mg Oral BID  . levothyroxine  75 mcg Oral Q0600  . midodrine  5 mg Oral Q8H  . sodium chloride flush  10-40 mL Intracatheter Q12H  . thiamine  100 mg Oral Daily   Or  . thiamine  100 mg Intravenous Daily    Continuous Inpatient Infusions:   . cefTRIAXone (ROCEPHIN)  IV 2 g (08/16/19 1636)  . dextrose 50 mL/hr at 08/15/19 1308  . metronidazole 500 mg (08/16/19 1835)    PRN Inpatient Medications:  albuterol, ipratropium, LORazepam **OR** LORazepam, ondansetron **OR** ondansetron (ZOFRAN) IV, sodium chloride flush  Review of Systems:  Unable to obtain due to patient's critical illness   Physical Examination: BP 119/72 (BP Location: Left Arm)   Pulse 90   Temp 98.7 F (37.1 C) (Oral)   Resp (!) 22   Ht 5\' 2"  (1.575 m)   Wt 61.8 kg   SpO2 97%   BMI 24.91 kg/m  Gen: NAD, alert and oriented to self  HEENT: PEERLA, EOMI, +scleral icterus Neck: supple, no JVD or thyromegaly Chest: CTA bilaterally, no wheezes, crackles, or other adventitious sounds CV: RRR, no m/g/c/r Abd: soft, moderately distended, hypoactive BS in all four quadrants; no tenderness to palpation, +HSM, no guarding, ridigity, or rebound tenderness Ext: 1+ edema Skin: no rash or lesions noted Lymph: no LAD  Data: Lab Results  Component Value Date   WBC 13.0 (H) 08/16/2019   HGB 9.1 (L) 08/16/2019   HCT 26.4 (L) 08/16/2019   MCV 95.3  08/16/2019   PLT 162 08/16/2019   Recent Labs  Lab 08/14/19 0432 08/15/19 0632 08/16/19 0448  HGB 8.7* 8.8* 9.1*   Lab Results  Component Value Date   NA 146 (H) 08/16/2019   K 3.4 (L) 08/16/2019   CL 119 (H) 08/16/2019   CO2 19 (L) 08/16/2019   BUN 15 08/16/2019   CREATININE 0.45 08/16/2019   Lab Results  Component Value Date   ALT 72 (H) 08/16/2019   AST 204 (H) 08/16/2019   ALKPHOS 304 (H) 08/16/2019   BILITOT 14.5 (H) 08/16/2019   Recent Labs  Lab 08/16/19 0448  INR 2.1*   Assessment/Plan:  44 y/o Caucasian female with a PMH of alcoholic cirrhosis of the liver with known history of esophageal and gastric varices, SLE, Hx of CVA, HTN, Hx of seizures, anxiety and depression, and CKD who presented to the [facility] ED via EMS after being found lying on the floor intoxicated  1. Alcoholic cirrhosis with ascites - MELD 22, Child's ClassC(10pts). Repeat abdominal ultrasound today revealed only small amount of perihepatic ascites considered to small a volume to attempt diagnostic paracentesis.  2. Alcoholic hepatitis - Maddrey DF 34  3. Coagulopathic - INR 1.8, platelets 26K  4. Hematemesis - no overt hematemesis this hospital admission. H&H stable. Protonix D/C'd. Octreotide has been discontinued after 72 hours of infusion.  5. Pyelonephritis - on IV antibiotics, primary team following.  6. Transaminitis -2/2 EtOH abuse  7. DNR status per family.  8. Cecal volvulus - Repeat CT performed yesterday did not seem to reveal a cecal volvulus, mainly right colonic inflammation/enteritis.   9. Severe encephalopathy - Multifactorial, mainly due to liver disease, possible sepsis.  Recommendations:  1. Continue current recommendations 2. Poor prognosis  3. Palliative care following. Follow-up meeting is planned for tomorrow at 11 AM 4. No signs of overt GI bleeding. Pt is hemodynamically stable.  5. Following  Please call with questions or concerns.    Octavia Bruckner, PA-C [facility] [phone] (Cell)

## 2019-08-16 NOTE — Evaluation (Addendum)
Clinical/Bedside Swallow Evaluation Patient Details  Name: Chloe [name] MRN: [id number] Date of Birth: May 20, 1975  Today's Date: 08/16/2019 Time: SLP Start Time (ACUTE ONLY): 0945 SLP Stop Time (ACUTE ONLY): 1045 SLP Time Calculation (min) (ACUTE ONLY): 60 min  Past Medical History:  Past Medical History:  Diagnosis Date  . Alcohol use disorder   . Alcohol withdrawal delirium ([place])   . Anxiety   . Cancer ([place]) 2015   cytosin for lupus  . Chronic kidney disease    rheumatologist follows her, lasix for this  . Collagen vascular disease ([place])   . Depression   . Hypertension   . Lupus ([facility])   . Raynaud's phenomenon   . Seizures ([place])   . Steatosis of liver    Past Surgical History:  Past Surgical History:  Procedure Laterality Date  . BREAST ENHANCEMENT SURGERY    . ORIF TIBIA FRACTURE Right 12/28/2015   Procedure: OPEN REDUCTION INTERNAL FIXATION (ORIF) TIBIA FRACTURE;  Surgeon: Hessie Knows, MD;  Location: [facility] ORS;  Service: Orthopedics;  Laterality: Right;   HPI:  Pt is a 44 y.o. female with a past medical history including current Alcohol abuse, Alcohol withdrawal delirium, Steatosis of liver, depression, hypertension, lupus, Raynaud's phenomenon,  presents to the emergency department for intoxication.  According to the patient she admits to heavy alcohol use, states it has been much less frequent since February when she was admitted to [facility] with liver issues.  However she does admit to continuing to drink on occasion including this morning.  Patient admits to getting intoxicated.  Per EMS patient was at her father's house, intoxicated, the father states he called EMS to get the patient help, which sounds more like he is hoping for help for the alcoholism.  Here the patient states that she decided to come to the hospital because her father think she really needs help.  Here the patient does admit to being intoxicated, she is alert and oriented x4 however.  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 Pt was in the ED for several days initially.  CT of abdomen revealed liver and GI issues; Bibasilar atelectasis, right greater than left.  Pt and father ensdorsed a Vomiting episode prior to admission.    Assessment / Plan / Recommendation Clinical Impression  Pt appears to present w/ adequate oropharyngeal phase swallow function w/ No overt, clinical s/s of aspiration noted during po trials. Pt appears at reduced risk for prandial aspiration, However, at increased risk for aspiration of Regurgitated, Reflux material backflowing when vomiting thus Pulmonary impact. Pt was explained general aspiration precautions and agreed verbally to the need for following them especially sitting upright for all oral intake. Pt assisted w/ positioning d/t weakness then given trials of thin liquids, ice chips, purees and soft solids(fewer trials of the puree/solids d/t GI issues and restrictions currently). NO overt, clinical s/s of aspiration were noted w/ any consistency; respiratory status remained calm and unlabored, vocal quality clear b/t trials. Pt held Cup when drinking from [place] following instructions for single, small sips slowly. Oral phase appeared grossly [facility] for bolus management and timely A-P transfer for swallowing; oral clearing achieved w/ all consistencies. Noted min increased oral phase time w/ increased textured trials(solids though moistened) suspect d/t overall weakness and attention to task. OM exam appeared wnl w/ no unilateral weakness noted; speech clear. Pt often required verbal cues for follow through; eyes closed at times - suspect to shut  out stimulation.  Recommend a more Mech Soft diet for ease of cut meats and self-feeding, ease of chewing moistened foods; Thin liquids. Recommend general aspiration precautions and REFLUX precautions d/t ETOH abuse and Raynaud's dis. dx per chart. Recommend Pills in Puree currently for safer  swallowing. Recommend support at meals for positioning, tray setup, and feeding as needed -- encouraged pt to take part in feeding tasks to engage Neuro input. MD consulted re: eval results and upgrade to foods in diet when GI appropriate. NSG and pt/family updated. No further skilled ST services indicated currently. NSG to reconsult if any change in status while admitted.  SLP Visit Diagnosis: Dysphagia, unspecified (R13.10)    Aspiration Risk   (reduced w/ precs)    Diet Recommendation  when GI appropriate per MD, a more Mech Soft diet consistency for cut meats, moistened cooked foods; Thin liquids. General aspiration and REFLUX precautions. Support at meals.  Medication Administration: Whole meds with puree (for safer swallowing)    Other  Recommendations Recommended Consults: Consider GI evaluation;Consider esophageal assessment (ongoing; DIetician) Oral Care Recommendations: Oral care BID;Oral care before and after PO;Patient independent with oral care Other Recommendations:  (n/a)   Follow up Recommendations None      Frequency and Duration  (n/a)   (n/a)       Prognosis Prognosis for Safe Diet Advancement: Good Barriers to Reach Goals: Behavior      Swallow Study   General Date of Onset: 08/09/19 HPI: Pt is a 44 y.o. female with a past medical history including current Alcohol abuse, Alcohol withdrawal delirium, Steatosis of liver, depression, hypertension, lupus, Raynaud's phenomenon,  presents to the emergency department for intoxication.  According to the patient she admits to heavy alcohol use, states it has been much less frequent since February when she was admitted to [facility] with liver issues.  However she does admit to continuing to drink on occasion including this morning.  Patient admits to getting intoxicated.  Per EMS patient was at her father's house, intoxicated, the father states he called EMS to get the patient help, which sounds more like he is hoping for help for  the alcoholism.  Here the patient states that she decided to come to the hospital because her father think she really needs help.  Here the patient does admit to being intoxicated, she is alert and oriented x4 however.  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 Pt was in the ED for several days initially.  CT of abdomen revealed liver and GI issues; Bibasilar atelectasis, right greater than left.  Pt and father ensdorsed a Vomiting episode prior to admission.  Type of Study: Bedside Swallow Evaluation Previous Swallow Assessment: none Diet Prior to this Study: Thin liquids (clear liquids per GI/MD currently) Temperature Spikes Noted: No (wbc 13.0 declining) Respiratory Status: Room air History of Recent Intubation: No Behavior/Cognition: Alert;Cooperative;Pleasant mood;Distractible;Requires cueing Oral Cavity Assessment: Dry Oral Care Completed by SLP: Recent completion by staff Oral Cavity - Dentition: Adequate natural dentition Vision: Functional for self-feeding Self-Feeding Abilities: Able to feed self;Needs assist;Needs set up (cues) Patient Positioning: Upright in bed (needed full positioning) Baseline Vocal Quality: Normal;Low vocal intensity (adequate) Volitional Cough: Strong Volitional Swallow: Able to elicit    Oral/Motor/Sensory Function Overall Oral Motor/Sensory Function: Within functional limits   Ice Chips Ice chips: Within functional limits Presentation: Spoon (fed; 3 trials)   Thin Liquid Thin Liquid: Within functional limits Presentation: Self Fed;Straw (supported;  10 trials)    Nectar Thick Nectar Thick Liquid: Not tested   Honey Thick Honey Thick Liquid: Not tested   Puree Puree: Within functional limits Presentation: Spoon (fed; 3 trials)   Solid     Solid: Within functional limits Presentation: Spoon (fed; 3 trials)        Orinda Kenner, MS, CCC-SLP Carvell Hoeffner 08/16/2019,12:21 PM

## 2019-08-16 NOTE — Consult Note (Addendum)
[facility] for Electrolyte Monitoring and Replacement   Recent Labs: Potassium (mmol/L)  Date Value  08/16/2019 3.4 (L)   Magnesium (mg/dL)  Date Value  08/16/2019 1.9   Calcium (mg/dL)  Date Value  08/16/2019 7.6 (L)   Albumin (g/dL)  Date Value  08/16/2019 2.1 (L)   Phosphorus (mg/dL)  Date Value  08/16/2019 NOT VALID   Sodium (mmol/L)  Date Value  08/16/2019 146 (H)   Corrected Ca: 9.4 mg/dL  Assessment: 44 y.o.femalewith a known history of alcohol use disorder with withdrawal syndrome, CKD, anxiety/depression, hypertension, lupus, seizures presents to the emergency department for evaluation of alcohol intoxication.   MIVF: Continue D5W @ 18ml/hr   Goal of Therapy:  Potassium 4.0 - 5.1 mmol/L Magnesium 2.0 - 2.4 mg/dL All Other Electrolytes WNL  Plan:   IV potassium chloride 106meq x 4  F/u electrolytes at 6/16 am and replace as needed  Phos indeterminate - will recheck (pt resuming diet - physician ordered KPhos IV 20 mmol)   Lu Duffel ,PharmD Clinical Pharmacist 08/16/2019 7:07 AM

## 2019-08-17 DIAGNOSIS — R52 Pain, unspecified: Secondary | ICD-10-CM

## 2019-08-17 LAB — HEPATIC FUNCTION PANEL
ALT: 63 U/L — ABNORMAL HIGH (ref 0–44)
AST: 163 U/L — ABNORMAL HIGH (ref 15–41)
Albumin: 2 g/dL — ABNORMAL LOW (ref 3.5–5.0)
Alkaline Phosphatase: 301 U/L — ABNORMAL HIGH (ref 38–126)
Bilirubin, Direct: 9.3 mg/dL — ABNORMAL HIGH (ref 0.0–0.2)
Indirect Bilirubin: 6.8 mg/dL — ABNORMAL HIGH (ref 0.3–0.9)
Total Bilirubin: 16.1 mg/dL — ABNORMAL HIGH (ref 0.3–1.2)
Total Protein: 5.9 g/dL — ABNORMAL LOW (ref 6.5–8.1)

## 2019-08-17 LAB — PROTIME-INR
INR: 2.1 — ABNORMAL HIGH (ref 0.8–1.2)
Prothrombin Time: 22.4 seconds — ABNORMAL HIGH (ref 11.4–15.2)

## 2019-08-17 LAB — CBC
HCT: 23.5 % — ABNORMAL LOW (ref 36.0–46.0)
Hemoglobin: 8.6 g/dL — ABNORMAL LOW (ref 12.0–15.0)
MCH: 33.7 pg (ref 26.0–34.0)
MCHC: 36.6 g/dL — ABNORMAL HIGH (ref 30.0–36.0)
MCV: 92.2 fL (ref 80.0–100.0)
Platelets: 139 10*3/uL — ABNORMAL LOW (ref 150–400)
RBC: 2.55 MIL/uL — ABNORMAL LOW (ref 3.87–5.11)
RDW: 18.1 % — ABNORMAL HIGH (ref 11.5–15.5)
WBC: 13.6 10*3/uL — ABNORMAL HIGH (ref 4.0–10.5)
nRBC: 1.2 % — ABNORMAL HIGH (ref 0.0–0.2)

## 2019-08-17 LAB — PHOSPHORUS: Phosphorus: UNDETERMINED mg/dL (ref 2.5–4.6)

## 2019-08-17 LAB — GLUCOSE, CAPILLARY
Glucose-Capillary: 119 mg/dL — ABNORMAL HIGH (ref 70–99)
Glucose-Capillary: 113 mg/dL — ABNORMAL HIGH (ref 70–99)
Glucose-Capillary: 140 mg/dL — ABNORMAL HIGH (ref 70–99)

## 2019-08-17 LAB — BASIC METABOLIC PANEL
Anion gap: 7 (ref 5–15)
BUN: 13 mg/dL (ref 6–20)
CO2: 20 mmol/L — ABNORMAL LOW (ref 22–32)
Calcium: 7.7 mg/dL — ABNORMAL LOW (ref 8.9–10.3)
Chloride: 117 mmol/L — ABNORMAL HIGH (ref 98–111)
Creatinine, Ser: 0.5 mg/dL (ref 0.44–1.00)
GFR calc Af Amer: >60 mL/min (ref >60)
GFR calc non Af Amer: >60 mL/min (ref >60)
Glucose, Bld: 123 mg/dL — ABNORMAL HIGH (ref 70–99)
Potassium: 3.3 mmol/L — ABNORMAL LOW (ref 3.5–5.1)
Sodium: 144 mmol/L (ref 135–145)

## 2019-08-17 LAB — AMMONIA: Ammonia: 56 umol/L — ABNORMAL HIGH (ref 9–35)

## 2019-08-17 LAB — MAGNESIUM: Magnesium: 1.7 mg/dL (ref 1.7–2.4)

## 2019-08-17 MED ORDER — POTASSIUM & SODIUM PHOSPHATES 280-160-250 MG PO PACK
1.0000 | PACK | Freq: Three times a day (TID) | ORAL | Status: DC
  Filled 2019-08-17 (×4): qty 1

## 2019-08-17 MED ORDER — CEPHALEXIN 500 MG PO CAPS
500.0000 mg | ORAL_CAPSULE | Freq: Three times a day (TID) | ORAL | Status: DC
  Administered 2019-08-17 – 2019-08-18 (×3): 500 mg via ORAL
  Filled 2019-08-17 (×3): qty 1

## 2019-08-17 MED ORDER — FENTANYL CITRATE (PF) 100 MCG/2ML IJ SOLN
25.0000 ug | INTRAMUSCULAR | Status: DC | PRN
  Administered 2019-08-17 – 2019-08-19 (×6): 25 ug via INTRAVENOUS
  Filled 2019-08-17 (×6): qty 2

## 2019-08-17 MED ORDER — MAGNESIUM SULFATE 2 GM/50ML IV SOLN
2.0000 g | Freq: Once | INTRAVENOUS | Status: AC
  Administered 2019-08-17: 2 g via INTRAVENOUS
  Filled 2019-08-17: qty 50

## 2019-08-17 MED ORDER — POTASSIUM CL IN DEXTROSE 5% 20 MEQ/L IV SOLN
20.0000 meq | INTRAVENOUS | Status: DC
  Filled 2019-08-17: qty 1000

## 2019-08-17 MED ORDER — POTASSIUM CHLORIDE 10 MEQ/100ML IV SOLN: 10.0000 meq | INTRAVENOUS | Status: DC

## 2019-08-17 MED ORDER — POTASSIUM CL IN DEXTROSE 5% 20 MEQ/L IV SOLN
20.0000 meq | INTRAVENOUS | Status: DC
  Administered 2019-08-17: 20 meq via INTRAVENOUS
  Filled 2019-08-17: qty 1000

## 2019-08-17 MED ORDER — POTASSIUM CHLORIDE CRYS ER 20 MEQ PO TBCR
40.0000 meq | EXTENDED_RELEASE_TABLET | Freq: Once | ORAL | Status: DC
  Filled 2019-08-17: qty 2

## 2019-08-17 NOTE — Progress Notes (Signed)
   08/17/19 1350  Clinical Encounter Type  Visited With Patient  Visit Type Follow-up;Spiritual support;Social support  Referral From Chaplain  Consult/Referral To Chaplain  Ch followed-up with Pt and family. Pt was sleep, but I talked to family. Family said they were doing as well as to be expected. Ch will follow-up with family.

## 2019-08-17 NOTE — Progress Notes (Addendum)
Patient ID: Chloe [name], female   DOB: 02/19/1976, 44 y.o.   MRN: [id number]  This NP visited patient at the bedside as a follow up for palliative medicine needs and to meet as scheduled with family for continued conversation regarding current medical situation.  Met with both patient's father and her sister/Kelly  Patient  is more lethargic today.  Taking very little po intake  Education offered regarding the difference between and aggressive medical intervention path and a palliative comfort path for this patient at this time in this situation.   Discussed the natural trajectory of end-stage liver disease and expectations at EOL  Plan of Care; - DNR/DNI - focus of care is comfort and dignity -Symptom management specific to pain/dyspnea-see orders - limit life prolonging interventions     - no IV fluids or IV medications     - no further diagnostics; lab draws, scans     - no escalation of care - diet as tolerated -offer po medications if tolerated (expectation is that patient will likely not be able to swallow effectively)  - Monitor patient over the next 24-48 hours.  I f she continues to decompensate transition to       IP residential hospice unit, allowing for a natural death.   Family would be interested in [facility]  Discussed with family  the importance of continued conversation each other and the  medical providers regarding overall plan of care and treatment options,  ensuring decisions are within the context of the patients values and GOCs.  Questions and concerns addressed   Discussed with Dr Starla Link   Palliative medicine team will continue to support holistically.  Family encouraged to call with questions or concerns  Total time spent on the unit was 35  minutes  Greater than 50% of the time was spent in counseling and coordination of care  Wadie Lessen NP  Palliative Medicine Team Team Phone # [phone] Pager [phone]

## 2019-08-17 NOTE — Progress Notes (Signed)
Patient ID: Chloe [name], female   DOB: 09-30-1975, 44 y.o.   MRN: [id number]  PROGRESS NOTE    Chloe [name]  HAL:[id number] DOB: 02/19/1976 DOA: 08/09/2019 PCP: Luis Llorens Torres   Brief Narrative:  44 year old female with history of chronic alcoholism and liver disease presented after she was found fallen down on the floor by her father.  She was having hematemesis with coffee-ground emesis few days prior to presentation.  During the hospitalization, GI was consulted.  She was supposed to have EGD but patient became critically ill with high-grade fever and tachycardia.  She was started on IV Rocephin for E. coli UTI.  She was not having any overt bleeding during the hospitalization so EGD was postponed.  There was a concern for cecal volvulus causing abdominal distention.  She was transferred to ICU.  Subsequently she was changed to a DNR and transferred back to medical floor.  Palliative care was consulted.  Overall prognosis is guarded to poor.  Assessment & Plan:   Acute toxic/metabolic encephalopathy -Due to alcohol abuse/metabolic acidosis and hyperammonemia -Continue monitor mental status.  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  Subsequently, CODE STATUS was changed to DNR because of overall very poor prognosis -She has been  subsequently transferred to medical floor. -Palliative care has been consulted: Probable palliative care meeting with family today -Continue lactulose -Ultrasound of the abdomen showed only trace amount of intra-abdominal ascites, too small to allow for safe ultrasound-guided paracentesis -H&H is stable, hemoglobin 8.6 today.  Status post IV Protonix and octreotide drip for a total of 72 hours  Sepsis due to UTI/acute pyelonephritis: Present on admission -Currently on Rocephin and Flagyl.  DC Flagyl.  Urine cultures growing pansensitive E. Coli.  Finish 10-day course of therapy -Sepsis is resolved  Hypokalemia -Replace.  Repeat a.m. labs  Hypernatremia -Improved.  Monitor  History of depression/anxiety -Was on Cymbalta.  Held for now  History of hypothyroidism -Continue Synthroid   DVT prophylaxis: SCDs Code Status: DNR Family Communication: None at bedside  disposition Plan: Status is: Inpatient  Remains inpatient appropriate because:Altered mental status   Dispo: The patient is from: Home              Anticipated d/c is to: Undetermined              Anticipated d/c date is: 2 days              Patient currently is not medically stable to d/c.   Consultants: GI/critical care/palliative care  Procedures: None  Antimicrobials:  Anti-infectives (From admission, onward)   Start     Dose/Rate Route Frequency Ordered Stop   08/16/19 1400  cefTRIAXone (ROCEPHIN) 2 g in sodium chloride 0.9 % 100 mL IVPB     Discontinue     2 g 200 mL/hr over 30 Minutes Intravenous Every 24 hours 08/16/19 0929     08/14/19 0800  vancomycin (VANCOREADY) IVPB 750 mg/150 mL  Status:  Discontinued  750 mg 150 mL/hr over 60 Minutes Intravenous Every 12 hours 08/13/19 1542 08/15/19 1355   08/13/19 1545  metroNIDAZOLE (FLAGYL) IVPB 500 mg     Discontinue     500 mg 100 mL/hr over 60 Minutes Intravenous Every 8 hours 08/13/19 1522     08/13/19 1530  ceFEPIme (MAXIPIME) 2 g in sodium  chloride 0.9 % 100 mL IVPB  Status:  Discontinued        2 g 200 mL/hr over 30 Minutes Intravenous Every 8 hours 08/13/19 1522 08/16/19 0929   08/13/19 1530  vancomycin (VANCOREADY) IVPB 1500 mg/300 mL        1,500 mg 150 mL/hr over 120 Minutes Intravenous  Once 08/13/19 1528 08/13/19 2145   08/11/19 1600  cefTRIAXone (ROCEPHIN) 2 g in sodium chloride 0.9 % 100 mL IVPB  Status:  Discontinued        2 g 200 mL/hr over 30 Minutes Intravenous Every 24 hours 08/11/19 1326 08/13/19 1522   08/10/19 1730  cefTRIAXone (ROCEPHIN) 1 g in sodium chloride 0.9 % 100 mL IVPB  Status:  Discontinued        1 g 200 mL/hr over 30 Minutes Intravenous Every 24 hours 08/09/19 2042 08/11/19 1326   08/09/19 1730  cefTRIAXone (ROCEPHIN) 1 g in sodium chloride 0.9 % 100 mL IVPB        1 g 200 mL/hr over 30 Minutes Intravenous  Once 08/09/19 1726 08/09/19 1902       Subjective: Patient seen and examined at bedside.  She is sleepy, wakes up very slightly, hardly answers any questions.  No overnight fever or vomiting reported.  Objective: Vitals:   08/16/19 2153 08/16/19 2321 08/17/19 0548 08/17/19 0756  BP: 108/76 110/75 (!) 106/59 110/68  Pulse: 88 87 89 79  Resp:  20  16  Temp:  98.6 F (37 C)  98.4 F (36.9 C)  TempSrc:  Oral  Axillary  SpO2:  98%  97%  Weight:      Height:        Intake/Output Summary (Last 24 hours) at 08/17/2019 1041 Last data filed at 08/17/2019 0612 Gross per 24 hour  Intake 767.47 ml  Output 400 ml  Net 367.47 ml   Filed Weights   08/09/19 1618 08/10/19 2127  Weight: 57.6 kg 61.8 kg    Examination:  General exam: Appears chronically ill and older than stated age.  No distress.  Sleepy, wakes up only very slightly, hardly answers any questions.  Looks icteric Respiratory system: Bilateral decreased breath sounds at bases with scattered crackles  cardiovascular system: S1 & S2 heard, Rate controlled Gastrointestinal system: Abdomen is distended, soft and nontender.  Normal bowel sounds heard. Extremities: No cyanosis, clubbing; trace lower extremity edema present Central nervous system: Very drowsy.  No focal neurological deficits. Moving extremities Skin: No rashes, lesions or ulcers Psychiatry: Could not be assessed because of mental status   Data Reviewed: I have personally reviewed following labs and imaging studies  CBC: Recent Labs  Lab 08/13/19 0607 08/14/19 0432 08/15/19 0632 08/16/19 0448 08/17/19 0543  WBC 11.8* 14.5* 13.5* 13.0* 13.6*  HGB 10.4* 8.7* 8.8* 9.1* 8.6*  HCT 29.1* 25.0* 24.4* 26.4* 23.5*  MCV 93.3 96.2 94.9 95.3 92.2  PLT 85* 120* 138* 162 782*   Basic Metabolic Panel: Recent Labs  Lab 08/13/19 1202 08/14/19 0432 08/15/19 0632 08/16/19 0448 08/17/19 0543  NA 141 146* 150* 146* 144  K 4.5 3.6 3.5 3.4* 3.3*  CL 110 115*  121* 119* 117*  CO2 19* 22 23 19* 20*  GLUCOSE 147* 116* 95 97 123*  BUN 12 14 17 15 13   CREATININE 0.62 0.49 0.62 0.45 0.50  CALCIUM 7.5* 7.4* 7.4* 7.6* 7.7*  MG 1.5* 1.8 2.3 1.9 1.7  PHOS UNABLE TO REPORT DUE TO ICTERUS 1.6* UNABLE TO REPORT DUE TO ICTERUS UNABLE TO REPORT DUE TO ICTERUS UNABLE TO REPORT DUE TO ICTERUS   GFR: Estimated Creatinine Clearance: 78.4 mL/min (by C-G formula based on SCr of 0.5 mg/dL). Liver Function Tests: Recent Labs  Lab 08/13/19 1202 08/14/19 0432 08/15/19 0632 08/16/19 0448 08/17/19 0543  AST 305* 313* 237* 204* 163*  ALT 84* 88* 79* 72* 63*  ALKPHOS 222* 251* 292* 304* 301*  BILITOT 12.9* 11.7* 12.8* 14.5* 16.1*  PROT 6.0* 5.5* 5.7* 5.7* 5.9*  ALBUMIN 2.5* 2.2* 2.2* 2.1* 2.0*   No results for input(s): LIPASE, AMYLASE in the last 168 hours. Recent Labs  Lab 08/13/19 0813 08/14/19 0432 08/15/19 0632 08/16/19 0448 08/17/19 0644  AMMONIA 54* 49* 33 44* 56*   Coagulation Profile: Recent Labs  Lab 08/12/19 1853 08/16/19 0448 08/17/19 0643  INR 1.9* 2.1* 2.1*   Cardiac Enzymes: No results for input(s): CKTOTAL, CKMB, CKMBINDEX,  TROPONINI in the last 168 hours. BNP (last 3 results) No results for input(s): PROBNP in the last 8760 hours. HbA1C: No results for input(s): HGBA1C in the last 72 hours. CBG: Recent Labs  Lab 08/15/19 1722 08/15/19 2329 08/16/19 0808 08/16/19 1636 08/17/19 0124  GLUCAP 90 76 82 107* 113*   Lipid Profile: No results for input(s): CHOL, HDL, LDLCALC, TRIG, CHOLHDL, LDLDIRECT in the last 72 hours. Thyroid Function Tests: No results for input(s): TSH, T4TOTAL, FREET4, T3FREE, THYROIDAB in the last 72 hours. Anemia Panel: No results for input(s): VITAMINB12, FOLATE, FERRITIN, TIBC, IRON, RETICCTPCT in the last 72 hours. Sepsis Labs: Recent Labs  Lab 08/10/19 1114 08/11/19 0408 08/12/19 0538  PROCALCITON  --  2.78 1.98  LATICACIDVEN 1.1  --   --     Recent Results (from the past 240 hour(s))  Urine Culture     Status: Abnormal   Collection Time: 08/09/19  5:27 PM   Specimen: Urine, Random  Result Value Ref Range Status   Specimen Description   Final    URINE, RANDOM Performed at [facility], [address] [place], [place] [address]    Special Requests   Final    NONE Performed at [facility], [address] [place], [place] [address]    Culture >=100,000 COLONIES/mL ESCHERICHIA COLI (A)  Final   Report Status 08/11/2019 FINAL  Final   Organism ID, Bacteria ESCHERICHIA COLI (A)  Final      Susceptibility   Escherichia coli - MIC*    AMPICILLIN <=2 SENSITIVE Sensitive     CEFAZOLIN <=4 SENSITIVE Sensitive     CEFTRIAXONE <=1 SENSITIVE Sensitive     CIPROFLOXACIN <=0.25 SENSITIVE Sensitive     GENTAMICIN <=1 SENSITIVE Sensitive     IMIPENEM <=0.25 SENSITIVE Sensitive     NITROFURANTOIN <=16 SENSITIVE Sensitive     TRIMETH/SULFA <=20 SENSITIVE Sensitive     AMPICILLIN/SULBACTAM <=2 SENSITIVE Sensitive     PIP/TAZO <=4 SENSITIVE Sensitive     * >=100,000 COLONIES/mL ESCHERICHIA COLI  SARS Coronavirus 2 by RT PCR (hospital order, performed in  [facility] hospital lab) Nasopharyngeal Nasopharyngeal Swab     Status: None   Collection Time: 08/09/19  7:30 PM   Specimen: Nasopharyngeal Swab  Result Value Ref Range Status   SARS Coronavirus 2 NEGATIVE NEGATIVE Final    Comment: (NOTE) SARS-CoV-2 target nucleic acids are NOT DETECTED. The SARS-CoV-2 RNA is generally detectable in upper and lower respiratory specimens during the acute phase of infection. The lowest concentration of SARS-CoV-2 viral copies this assay can detect is 250 copies / mL. A negative result does not preclude SARS-CoV-2 infection and should not be used as the sole basis for treatment or other patient management decisions.  A negative result may occur with improper specimen collection / handling, submission of specimen other than nasopharyngeal swab, presence of viral mutation(s) within the areas targeted by this assay, and inadequate number of viral copies (<250 copies / mL). A negative result must be combined with clinical observations, patient history, and epidemiological information. Fact Sheet for Patients:   StrictlyIdeas.no Fact Sheet for Healthcare Providers: BankingDealers.co.za This test is not yet approved or cleared  by the [place] FDA and has been authorized for detection and/or diagnosis of SARS-CoV-2 by FDA under an Emergency Use Authorization (EUA).  This EUA will remain in effect (meaning this test can be used) for the duration of the COVID-19 declaration under Section 564(b)(1) of the Act, 21 U.S.C. section 360bbb-3(b)(1), unless the authorization is terminated or revoked sooner. Performed at [facility], [address] [place], [place] [address]   MRSA PCR Screening     Status: None   Collection Time: 08/13/19  1:09 PM   Specimen: Nasopharyngeal  Result Value Ref Range Status   MRSA by PCR NEGATIVE NEGATIVE Final    Comment:        The GeneXpert MRSA Assay (FDA approved  for NASAL specimens only), is one component of a comprehensive MRSA colonization surveillance program. It is not intended to diagnose MRSA infection nor to guide or monitor treatment for MRSA infections. Performed at [facility], [address] [place], [place] [address]          Radiology Studies: DG Abd 1 View  Result Date: 08/15/2019 CLINICAL DATA:  Small bowel obstruction. EXAM: ABDOMEN - 1 VIEW COMPARISON:  08/13/2019 FINDINGS: An enteric tube remains in place with tip projecting over the gastric body. Overall, the degree of bowel distension has decreased from the prior study. There is persistent moderate gaseous distension of the transverse colon with milder gaseous distension of bowel loops elsewhere which may reflect a combination of small and large bowel. No acute osseous abnormality is seen. IMPRESSION: Decreased bowel distension. Electronically Signed   By: Logan Bores M.D.   On: 08/15/2019 12:37   CT ABDOMEN PELVIS W CONTRAST  Result Date: 08/15/2019 CLINICAL DATA:  Abdominal distension EXAM: CT ABDOMEN AND PELVIS WITH CONTRAST TECHNIQUE: Multidetector CT imaging of the abdomen and pelvis was performed using the standard protocol following bolus administration of intravenous contrast. CONTRAST:  21mL OMNIPAQUE IOHEXOL 300 MG/ML  SOLN COMPARISON:  08/10/2018 FINDINGS: Lower chest: Bibasilar atelectasis, right greater than left, which has slightly increased from prior. Trace pericardial effusion. Hepatobiliary: Marked hepatic steatosis. Nodular hepatic surface contour. Heterogeneous appearance of the liver parenchyma, similar to prior. Moderate distention of the gallbladder, which appears mildly thick-walled. No hyperdense gallstone evident. Pancreas: Unremarkable. No pancreatic ductal dilatation or surrounding inflammatory changes. Spleen: Spleen is mildly enlarged, unchanged from prior. Adrenals/Urinary Tract: Unremarkable adrenal glands. Subcentimeter cortically based  low-density lesion in the lower pole the right kidney, too small to characterize. Right greater than left striated nephrograms, most evident on delayed phase imaging (for example series  7, image 21). Urinary bladder is mildly thick walled although under distended. Stomach/Bowel: Interval development of diffuse pancolonic wall thickening, most pronounced within the right colon and cecum. Numerous fluid-filled loops of small bowel throughout the abdomen. The stomach and proximal small bowel is distended with oral contrast. No evidence of bowel obstruction. An enteric tube terminates within the stomach. Vascular/Lymphatic: Extensive upper abdominal and paraesophageal varices. Normal caliber aorta without aneurysm. No abdominopelvic lymphadenopathy identified. Reproductive: Uterus and bilateral adnexa are unremarkable. Other: Interval development of small volume ascites throughout the abdomen and pelvis. No well-defined or rim enhancing fluid collections. No pneumoperitoneum. Mild anasarca. Musculoskeletal: No acute or significant osseous findings. IMPRESSION: 1. Interval development of diffuse pancolonic wall thickening, most pronounced within the right colon and cecum. There also mildly thickened fluid-filled loops of small bowel throughout the abdomen. Findings are suggestive of an enterocolitis, which may be infectious or inflammatory in etiology. No evidence of bowel obstruction. 2. Interval development of small volume ascites throughout the abdomen and pelvis. 3. Again noted are bilateral striated nephrograms, right greater than left, most evident on delayed phase imaging, suggesting pyelonephritis. Correlate with urinalysis. 4. Moderately distended gallbladder with suggestion of mild diffuse gallbladder wall thickening. Findings are nonspecific in the setting of concurrent hepatocellular disease. If there is clinical concern for cholecystitis, a right upper quadrant ultrasound could be performed. 5. Marked  hepatic steatosis with findings of cirrhosis and portal hypertension. Heterogeneous appearance of the liver parenchyma, similar to prior study. 6. Bibasilar atelectasis, right greater than left, which has slightly increased from prior. 7. Trace pericardial effusion. Electronically Signed   By: Davina Poke D.O.   On: 08/15/2019 15:55   US Abdomen Limited  Result Date: 08/15/2019 CLINICAL DATA:  History of cirrhosis. Please perform ascites search ultrasound ultrasound-guided paracentesis as indicated. EXAM: LIMITED ABDOMEN ULTRASOUND FOR ASCITES TECHNIQUE: Limited ultrasound survey for ascites was performed in all four abdominal quadrants. COMPARISON:  CT abdomen and pelvis-08/10/2019 FINDINGS: Sonographic evaluation of the abdomen demonstrates a trace amount of perihepatic (image 2) and free fluid within the left lower abdominal quadrant (image 6), too small to allow for safe ultrasound-guided paracentesis. As such, no paracentesis attempted. IMPRESSION: Trace amount of intra-abdominal ascites, too small to allow for safe ultrasound-guided paracentesis. Electronically Signed   By: Sandi Mariscal M.D.   On: 08/15/2019 11:49        Scheduled Meds: . sodium chloride   Intravenous Once  . Chlorhexidine Gluconate Cloth  6 each Topical Daily  . folic acid  1 mg Oral Daily  . lactulose  30 g Oral TID  . levETIRAcetam  500 mg Oral BID  . levothyroxine  75 mcg Oral Q0600  . midodrine  5 mg Oral Q8H  . potassium & sodium phosphates  1 packet Oral TID WC & HS  . potassium chloride  40 mEq Oral Once  . sodium chloride flush  10-40 mL Intracatheter Q12H  . thiamine  100 mg Oral Daily   Or  . thiamine  100 mg Intravenous Daily   Continuous Infusions: . cefTRIAXone (ROCEPHIN)  IV Stopped (08/16/19 1709)  . dextrose Stopped (08/17/19 0601)  . magnesium sulfate bolus IVPB 2 g (08/17/19 1004)  . metronidazole 500 mg (08/17/19 0612)          Aline August, MD [facility] 08/17/2019,  10:41 AM

## 2019-08-17 NOTE — Consult Note (Addendum)
[facility] for Electrolyte Monitoring and Replacement   Recent Labs: Potassium (mmol/L)  Date Value  08/17/2019 3.3 (L)   Magnesium (mg/dL)  Date Value  08/17/2019 1.7   Calcium (mg/dL)  Date Value  08/17/2019 7.7 (L)   Albumin (g/dL)  Date Value  08/17/2019 2.0 (L)   Phosphorus (mg/dL)  Date Value  08/17/2019 UNABLE TO REPORT DUE TO ICTERUS   Sodium (mmol/L)  Date Value  08/17/2019 144   Corrected Ca: 9.4 mg/dL  Assessment: 44 y.o.femalewith a known history of alcohol use disorder with withdrawal syndrome, CKD, anxiety/depression, hypertension, lupus, seizures presents to the emergency department for evaluation of alcohol intoxication.   MIVF: Continue D5W @ 71ml/hr (changed to D5W w 62meq KCl)  Goal of Therapy:  Potassium 4.0 - 5.1 mmol/L Magnesium 2.0 - 2.4 mg/dL All Other Electrolytes WNL  Plan:   IV potassium chloride 10meq x 4 (changed to po)  Mg Sulfate 2g IV x 1  F/u electrolytes at 6/17 am and replace as needed  Phos indeterminate - will recheck - will order po KPhos at a moderate dose presumptively for refeed   Lu Duffel ,PharmD Clinical Pharmacist 08/17/2019 7:21 AM

## 2019-08-17 NOTE — Progress Notes (Signed)
GI Inpatient Follow-up Note  Subjective:  Patient seen in follow-up for alcoholic hepatitis, ascites, and hepatic encephalopathy. No acute events overnight. Family bedside - father and sister and brother. Patient is more lethargic today and is not getting much PO intake. Palliative care had meeting with family today regarding goals of care and focus of care was made for comfort and dignity and symptom management. No overt signs of gastrointestinal blood loss.   Scheduled Inpatient Medications:  . sodium chloride   Intravenous Once  . cephALEXin  500 mg Oral Q8H  . Chlorhexidine Gluconate Cloth  6 each Topical Daily  . folic acid  1 mg Oral Daily  . lactulose  30 g Oral TID  . levETIRAcetam  500 mg Oral BID  . levothyroxine  75 mcg Oral Q0600  . midodrine  5 mg Oral Q8H  . potassium chloride  40 mEq Oral Once  . sodium chloride flush  10-40 mL Intracatheter Q12H  . thiamine  100 mg Oral Daily   Or  . thiamine  100 mg Intravenous Daily    Continuous Inpatient Infusions:   . dextrose 5 % with KCl 20 mEq / L 20 mEq (08/17/19 1323)    PRN Inpatient Medications:  albuterol, fentaNYL (SUBLIMAZE) injection, ipratropium, LORazepam **OR** LORazepam, ondansetron **OR** ondansetron (ZOFRAN) IV, sodium chloride flush  Review of Systems:  Unable to obtain due to critical illness  Physical Examination: BP 110/68 (BP Location: Left Arm)   Pulse 79   Temp 98.4 F (36.9 C) (Axillary)   Resp 16   Ht 5\' 2"  (1.575 m)   Wt 61.8 kg   SpO2 97%   BMI 24.91 kg/m   Appears critically ill. Family bedside.  Gen: NAD, alert and oriented to self  HEENT: PEERLA, EOMI, +scleral icterus Neck: supple, no JVD or thyromegaly Chest: Rhonchi bilaterally CV: RRR, no m/g/c/r Abd: soft, moderately distended, hypoactive BS in all four quadrants; no tenderness to palpation, +HSM, no guarding, ridigity, or rebound tenderness Ext: 1+ edema Skin: no rash or lesions noted Lymph: no LAD  Data: Lab Results   Component Value Date   WBC 13.6 (H) 08/17/2019   HGB 8.6 (L) 08/17/2019   HCT 23.5 (L) 08/17/2019   MCV 92.2 08/17/2019   PLT 139 (L) 08/17/2019   Recent Labs  Lab 08/15/19 0632 08/16/19 0448 08/17/19 0543  HGB 8.8* 9.1* 8.6*   Lab Results  Component Value Date   NA 144 08/17/2019   K 3.3 (L) 08/17/2019   CL 117 (H) 08/17/2019   CO2 20 (L) 08/17/2019   BUN 13 08/17/2019   CREATININE 0.50 08/17/2019   Lab Results  Component Value Date   ALT 63 (H) 08/17/2019   AST 163 (H) 08/17/2019   ALKPHOS 301 (H) 08/17/2019   BILITOT 16.1 (H) 08/17/2019   Recent Labs  Lab 08/17/19 0643  INR 2.1*   Assessment/Plan:  44 y/o Caucasian female with a PMH of alcoholic cirrhosis of the liver with known history of esophageal and gastric varices, SLE, Hx of CVA, HTN, Hx of seizures, anxiety and depression, and CKD who presented to the [facility] ED via EMS after being found lying on the floor intoxicated  1. Alcoholic cirrhosis with ascites - MELD 22, Child's ClassC(10pts). Repeat abdominal ultrasound today revealed only small amount of perihepatic ascites considered to small a volume to attempt diagnostic paracentesis.  2. Alcoholic hepatitis - Maddrey DF 34  3. Coagulopathic - INR 1.8, platelets 26K  4. Hematemesis - no overt hematemesis  this hospital admission. H&H stable. Protonix D/C'd. Octreotide has been discontinued after 72 hours of infusion.  5. Pyelonephritis - on IV antibiotics, primary team following.   6. Transaminitis -2/2 EtOH abuse  7. DNR status per family.  8. Cecal volvulus - Repeat CT performed yesterday did not seem to reveal a cecal volvulus, mainly right colonic inflammation/enteritis.   9. Severe encephalopathy - Multifactorial, mainly due to liver disease, possible sepsis.  Recommendations:  1. Palliative Care had meeting with family today and patient was made comfort care with chosen to limit life prolonging interventions. Plan is to monitor  patient over next 24 hours and then transition to IP residential hospice 2. No further GI interventions indicated at this time 3. Very poor prognosis in setting of further hepatic decompensation 4. Emotional support was provided with family 5. GI will sign off at this time.  Please call with questions or concerns.    Octavia Bruckner, PA-C [facility] [phone] (Cell)

## 2019-08-18 DIAGNOSIS — K56609 Unspecified intestinal obstruction, unspecified as to partial versus complete obstruction: Secondary | ICD-10-CM

## 2019-08-18 DIAGNOSIS — R627 Adult failure to thrive: Secondary | ICD-10-CM

## 2019-08-18 LAB — GLUCOSE, CAPILLARY
Glucose-Capillary: 102 mg/dL — ABNORMAL HIGH (ref 70–99)
Glucose-Capillary: 85 mg/dL (ref 70–99)

## 2019-08-18 NOTE — Progress Notes (Signed)
Patient ID: Chloe [name], female   DOB: 02-Jan-1976, 44 y.o.   MRN: [id number]  PROGRESS NOTE    Shakiyla Coppernoll  MBE:[id number] DOB: [birth year] DOA: 08/09/2019 PCP: [place]   Brief Narrative:  44 year old female with history of chronic alcoholism and liver disease presented after she was found fallen down on the floor by her father.  She was having hematemesis with coffee-ground emesis few days prior to presentation.  During the hospitalization, GI was consulted.  She was supposed to have EGD but patient became critically ill with high-grade fever and tachycardia.  She was started on IV Rocephin for E. coli UTI.  She was not having any overt bleeding during the hospitalization so EGD was postponed.  There was a concern for cecal volvulus causing abdominal distention.  She was transferred to ICU.  Subsequently she was changed to a DNR and transferred back to medical floor.  Palliative care was consulted.  Overall prognosis is guarded to poor.  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  After palliative care discussion again with the family on 08/17/2019, she was transitioned to comfort measures.  Monitor for the next 24 to 48 hours and if she continues to decompensate, transition to inpatient residential hospice unit to allow hospital death.   DVT prophylaxis: Comfort measures Code Status: DNR Family Communication: None at bedside    disposition Plan: Status is: Inpatient  Remains inpatient appropriate because:Altered mental status.  Will probably need residential hospice   Dispo: The patient is from: Home              Anticipated d/c is to: Undetermined.  In hospital death versus residential hospice              Anticipated d/c date is: 2 days              Patient currently is not medically stable to d/c.   Consultants: GI/critical care/palliative care  Procedures: None  Antimicrobials:  Anti-infectives (From admission, onward)   Start     Dose/Rate Route Frequency Ordered Stop   08/17/19 1500  cephALEXin (KEFLEX) capsule 500 mg     Discontinue     500 mg Oral Every 8 hours 08/17/19 1446 08/19/19 1359   08/16/19 1400  cefTRIAXone (ROCEPHIN) 2 g in sodium chloride 0.9 % 100 mL IVPB  Status:  Discontinued        2 g 200 mL/hr over 30 Minutes Intravenous Every 24 hours 08/16/19 0929 08/17/19 1446   08/14/19 0800  vancomycin (VANCOREADY) IVPB 750 mg/150 mL  Status:  Discontinued        750 mg 150 mL/hr over 60 Minutes Intravenous Every 12 hours 08/13/19 1542 08/15/19 1355   08/13/19 1545  metroNIDAZOLE (FLAGYL) IVPB 500 mg  Status:  Discontinued        500 mg 100 mL/hr over 60 Minutes Intravenous Every 8 hours 08/13/19 1522 08/17/19 1053   08/13/19 1530  ceFEPIme (MAXIPIME) 2 g in sodium chloride 0.9 % 100 mL IVPB  Status:  Discontinued        2  g 200 mL/hr over 30 Minutes Intravenous Every 8 hours 08/13/19 1522 08/16/19 0929   08/13/19 1530  vancomycin (VANCOREADY) IVPB 1500 mg/300 mL        1,500 mg 150 mL/hr over 120 Minutes Intravenous  Once 08/13/19 1528 08/13/19 2145   08/11/19 1600  cefTRIAXone (ROCEPHIN) 2 g in sodium chloride 0.9 % 100 mL IVPB  Status:  Discontinued        2 g 200 mL/hr over 30 Minutes Intravenous Every 24 hours 08/11/19 1326 08/13/19 1522   08/10/19 1730  cefTRIAXone (ROCEPHIN) 1 g in sodium chloride 0.9 % 100 mL IVPB  Status:  Discontinued        1 g 200 mL/hr over 30 Minutes  Intravenous Every 24 hours 08/09/19 2042 08/11/19 1326   08/09/19 1730  cefTRIAXone (ROCEPHIN) 1 g in sodium chloride 0.9 % 100 mL IVPB        1 g 200 mL/hr over 30 Minutes Intravenous  Once 08/09/19 1726 08/09/19 1902       Subjective: Patient seen and examined at bedside.  Sleepy, hardly wakes up on calling her name.  Looks comfortable. Objective: Vitals:   08/17/19 1602 08/17/19 1959 08/18/19 0020 08/18/19 0416  BP: 110/67 117/74 109/69 110/65  Pulse: 86 84 78 85  Resp: 16 14 14 14   Temp: 99.8 F (37.7 C) 98.8 F (37.1 C) 98.8 F (37.1 C) 98.8 F (37.1 C)  TempSrc: Axillary     SpO2: 95% 96% 96% 97%  Weight:      Height:        Intake/Output Summary (Last 24 hours) at 08/18/2019 4287 Last data filed at 08/17/2019 1808 Gross per 24 hour  Intake 286.65 ml  Output 450 ml  Net -163.35 ml   Filed Weights   08/09/19 1618 08/10/19 2127  Weight: 57.6 kg 61.8 kg    Examination:  General exam: Appears chronically ill and older than stated age.  No acute distress.  Sleepy, hardly wakes up on calling her name.  Looks icteric  respiratory system: Bilateral decreased breath sounds at bases with some scattered crackles. cardiovascular system: Rate controlled, S1-S2 heard   Data Reviewed: I have personally reviewed following labs and imaging studies  CBC: Recent Labs  Lab 08/13/19 0607 08/14/19 0432 08/15/19 0632 08/16/19 0448 08/17/19 0543  WBC 11.8* 14.5* 13.5* 13.0* 13.6*  HGB 10.4* 8.7* 8.8* 9.1* 8.6*  HCT 29.1* 25.0* 24.4* 26.4* 23.5*  MCV 93.3 96.2 94.9 95.3 92.2  PLT 85* 120* 138* 162 681*   Basic Metabolic Panel: Recent Labs  Lab 08/13/19 1202 08/14/19 0432 08/15/19 0632 08/16/19 0448 08/17/19 0543  NA 141 146* 150* 146* 144  K 4.5 3.6 3.5 3.4* 3.3*  CL 110 115* 121* 119* 117*  CO2 19* 22 23 19* 20*  GLUCOSE 147* 116* 95 97 123*  BUN 12 14 17 15 13   CREATININE 0.62 0.49 0.62 0.45 0.50  CALCIUM 7.5* 7.4* 7.4* 7.6* 7.7*  MG 1.5* 1.8 2.3 1.9 1.7    PHOS UNABLE TO REPORT DUE TO ICTERUS 1.6* UNABLE TO REPORT DUE TO ICTERUS UNABLE TO REPORT DUE TO ICTERUS UNABLE TO REPORT DUE TO ICTERUS   GFR: Estimated Creatinine Clearance: 78.4 mL/min (by C-G formula based on SCr of 0.5 mg/dL). Liver Function Tests: Recent Labs  Lab 08/13/19 1202 08/14/19 0432 08/15/19 0632 08/16/19 0448 08/17/19 0543  AST 305* 313* 237* 204* 163*  ALT 84* 88* 79* 72* 63*  ALKPHOS 222* 251* 292* 304* 301*  BILITOT 12.9*  11.7* 12.8* 14.5* 16.1*  PROT 6.0* 5.5* 5.7* 5.7* 5.9*  ALBUMIN 2.5* 2.2* 2.2* 2.1* 2.0*   No results for input(s): LIPASE, AMYLASE in the last 168 hours. Recent Labs  Lab 08/13/19 0813 08/14/19 0432 08/15/19 0632 08/16/19 0448 08/17/19 0644  AMMONIA 54* 49* 33 44* 56*   Coagulation Profile: Recent Labs  Lab 08/12/19 1853 08/16/19 0448 08/17/19 0643  INR 1.9* 2.1* 2.1*   Cardiac Enzymes: No results for input(s): CKTOTAL, CKMB, CKMBINDEX, TROPONINI in the last 168 hours. BNP (last 3 results) No results for input(s): PROBNP in the last 8760 hours. HbA1C: No results for input(s): HGBA1C in the last 72 hours. CBG: Recent Labs  Lab 08/16/19 2319 08/17/19 0124 08/17/19 1151 08/18/19 0021 08/18/19 0414  GLUCAP 119* 113* 140* 102* 85   Lipid Profile: No results for input(s): CHOL, HDL, LDLCALC, TRIG, CHOLHDL, LDLDIRECT in the last 72 hours. Thyroid Function Tests: No results for input(s): TSH, T4TOTAL, FREET4, T3FREE, THYROIDAB in the last 72 hours. Anemia Panel: No results for input(s): VITAMINB12, FOLATE, FERRITIN, TIBC, IRON, RETICCTPCT in the last 72 hours. Sepsis Labs: Recent Labs  Lab 08/12/19 0538  PROCALCITON 1.98    Recent Results (from the past 240 hour(s))  Urine Culture     Status: Abnormal   Collection Time: 08/09/19  5:27 PM   Specimen: Urine, Random  Result Value Ref Range Status   Specimen Description   Final    URINE, RANDOM Performed at [facility], [address] [place],  [place] [address]    Special Requests   Final    NONE Performed at [facility], [place], [place] [address]    Culture >=100,000 COLONIES/mL ESCHERICHIA COLI (A)  Final   Report Status 08/11/2019 FINAL  Final   Organism ID, Bacteria ESCHERICHIA COLI (A)  Final      Susceptibility   Escherichia coli - MIC*    AMPICILLIN <=2 SENSITIVE Sensitive     CEFAZOLIN <=4 SENSITIVE Sensitive     CEFTRIAXONE <=1 SENSITIVE Sensitive     CIPROFLOXACIN <=0.25 SENSITIVE Sensitive     GENTAMICIN <=1 SENSITIVE Sensitive     IMIPENEM <=0.25 SENSITIVE Sensitive     NITROFURANTOIN <=16 SENSITIVE Sensitive     TRIMETH/SULFA <=20 SENSITIVE Sensitive     AMPICILLIN/SULBACTAM <=2 SENSITIVE Sensitive     PIP/TAZO <=4 SENSITIVE Sensitive     * >=100,000 COLONIES/mL ESCHERICHIA COLI  SARS Coronavirus 2 by RT PCR (hospital order, performed in [place] hospital lab) Nasopharyngeal Nasopharyngeal Swab     Status: None   Collection Time: 08/09/19  7:30 PM   Specimen: Nasopharyngeal Swab  Result Value Ref Range Status   SARS Coronavirus 2 NEGATIVE NEGATIVE Final    Comment: (NOTE) SARS-CoV-2 target nucleic acids are NOT DETECTED. The SARS-CoV-2 RNA is generally detectable in upper and lower respiratory specimens during the acute phase of infection. The lowest concentration of SARS-CoV-2 viral copies this assay can detect is 250 copies / mL. A negative result does not preclude SARS-CoV-2 infection and should not be used as the sole basis for treatment or other patient management decisions.  A negative result may occur with improper specimen collection / handling, submission of specimen other than nasopharyngeal swab, presence of viral mutation(s) within the areas targeted by this assay, and inadequate number of viral copies (<250 copies / mL). A negative result must be combined with clinical observations, patient history, and epidemiological information. Fact Sheet for Patients:    StrictlyIdeas.no Fact Sheet  for Healthcare Providers: BankingDealers.co.za This test is not yet approved or cleared  by the [place] and has been authorized for detection and/or diagnosis of SARS-CoV-2 by FDA under an Emergency Use Authorization (EUA).  This EUA will remain in effect (meaning this test can be used) for the duration of the COVID-19 declaration under Section 564(b)(1) of the Act, 21 U.S.C. section 360bbb-3(b)(1), unless the authorization is terminated or revoked sooner. Performed at [facility], [address] [place], [place] [address]   MRSA PCR Screening     Status: None   Collection Time: 08/13/19  1:09 PM   Specimen: Nasopharyngeal  Result Value Ref Range Status   MRSA by PCR NEGATIVE NEGATIVE Final    Comment:        The GeneXpert MRSA Assay (FDA approved for NASAL specimens only), is one component of a comprehensive MRSA colonization surveillance program. It is not intended to diagnose MRSA infection nor to guide or monitor treatment for MRSA infections. Performed at [facility], [address] [place], [place] [address]          Radiology Studies: No results found.      Scheduled Meds: . sodium chloride   Intravenous Once  . cephALEXin  500 mg Oral Q8H  . Chlorhexidine Gluconate Cloth  6 each Topical Daily  . folic acid  1 mg Oral Daily  . lactulose  30 g Oral TID  . levETIRAcetam  500 mg Oral BID  . levothyroxine  75 mcg Oral Q0600  . midodrine  5 mg Oral Q8H  . potassium chloride  40 mEq Oral Once  . sodium chloride flush  10-40 mL Intracatheter Q12H  . thiamine  100 mg Oral Daily   Or  . thiamine  100 mg Intravenous Daily   Continuous Infusions: . dextrose 5 % with KCl 20 mEq / L 10 mL/hr at 08/17/19 1808          Aline August, MD [facility] 08/18/2019, 7:22 AM

## 2019-08-18 NOTE — Progress Notes (Signed)
Patient ID: Chloe [name], female   DOB: [birth year], 44 y.o.   MRN: [id number]  This NP visited patient at the bedside as a follow up for palliative medicine needs and emotional support.     Discussed with bedside nursing, patient remains lethargic with poor p.o. intake.  Spoke to father by telephone, he is at the bedside.  Chloe [name] a sense of peace regarding family's decision to focus on comfort and dignity at this time. Chloe [name] tells me that "Chloe [name], I just want her to be comfortable".  Continued education regarding the natural trajectory of end-stage liver disease and expectations at EOL.  Plan of Care; - DNR/DNI - focus of care is comfort and dignity -Symptom management specific to pain/dyspnea-see orders - limit life prolonging interventions     - no IV fluids or IV medications     - no further diagnostics; lab draws, scans     - no escalation of care - diet as tolerated -offer po medications if tolerated (expectation is that patient will likely not be able to swallow effectively)  - Monitor patient over the next 24 hours.  We will likely place referral for residential hospice in the morning.   Discussed with family  the importance of continued conversation each other and the  medical providers regarding overall plan of care and treatment options,  ensuring decisions are within the context of the patients values and GOCs.  Questions and concerns addressed   Discussed with Dr Starla Link via secure chat and bedside RN  Palliative medicine team will continue to support holistically.  Family encouraged to call with questions or concerns  Total time spent was 20  minutes  Greater than 50% of the time was spent in counseling and coordination of care  Wadie Lessen NP  Palliative Medicine Team Team Phone # [phone] Pager [phone]

## 2019-08-18 NOTE — Care Management Important Message (Signed)
Important Message  Patient Details  Name: Chloe [name] MRN: [id number] Date of Birth: September 29, 1975   Medicare Important Message Given:  Other (see comment)  Patient on comfort measures so no Important Message given out the respect of the patient and family.    Juliann Pulse A [name] 08/18/2019, 8:20 AM

## 2019-08-18 NOTE — Plan of Care (Signed)
  Problem: Education: Goal: Knowledge of General Education information will improve Description: Including pain rating scale, medication(s)/side effects and non-pharmacologic comfort measures Outcome: Progressing   Problem: Clinical Measurements: Goal: Ability to maintain clinical measurements within normal limits will improve Outcome: Progressing   Problem: Clinical Measurements: Goal: Will remain free from infection Outcome: Progressing   Problem: Clinical Measurements: Goal: Respiratory complications will improve Outcome: Progressing   Problem: Clinical Measurements: Goal: Cardiovascular complication will be avoided Outcome: Progressing   Problem: Coping: Goal: Level of anxiety will decrease Outcome: Progressing   Problem: Elimination: Goal: Will not experience complications related to bowel motility Outcome: Progressing   Problem: Pain Managment: Goal: General experience of comfort will improve Outcome: Progressing   Problem: Safety: Goal: Ability to remain free from injury will improve Outcome: Progressing   Problem: Skin Integrity: Goal: Risk for impaired skin integrity will decrease Outcome: Progressing

## 2019-08-19 DIAGNOSIS — Z515 Encounter for palliative care: Secondary | ICD-10-CM

## 2019-08-19 DIAGNOSIS — K7210 Chronic hepatic failure without coma: Secondary | ICD-10-CM

## 2019-08-19 DIAGNOSIS — K7290 Hepatic failure, unspecified without coma: Secondary | ICD-10-CM

## 2019-08-19 DIAGNOSIS — Z66 Do not resuscitate: Secondary | ICD-10-CM

## 2019-08-19 DIAGNOSIS — R1084 Generalized abdominal pain: Secondary | ICD-10-CM

## 2019-08-19 MED ORDER — GLYCOPYRROLATE 0.2 MG/ML IJ SOLN: 0.2000 mg | INTRAMUSCULAR | Status: DC | PRN

## 2019-08-19 MED ORDER — HYDROMORPHONE HCL 1 MG/ML IJ SOLN
0.5000 mg | INTRAMUSCULAR | Status: DC | PRN
  Administered 2019-08-19: 0.5 mg via INTRAVENOUS
  Filled 2019-08-19: qty 1

## 2019-08-19 MED ORDER — LORAZEPAM 1 MG PO TABS: 1.0000 mg | ORAL_TABLET | ORAL | Status: DC | PRN

## 2019-08-19 MED ORDER — HALOPERIDOL LACTATE 2 MG/ML PO CONC
0.5000 mg | ORAL | Status: DC | PRN
  Filled 2019-08-19: qty 0.3

## 2019-08-19 MED ORDER — PROMETHAZINE HCL 25 MG/ML IJ SOLN
25.0000 mg | Freq: Four times a day (QID) | INTRAMUSCULAR | Status: DC | PRN
  Administered 2019-08-19: 15:00:00 25 mg via INTRAVENOUS
  Filled 2019-08-19: qty 1

## 2019-08-19 MED ORDER — HALOPERIDOL 0.5 MG PO TABS
0.5000 mg | ORAL_TABLET | ORAL | Status: DC | PRN
  Filled 2019-08-19: qty 1

## 2019-08-19 MED ORDER — LORAZEPAM 2 MG/ML PO CONC: 1.0000 mg | ORAL | Status: DC | PRN

## 2019-08-19 MED ORDER — HALOPERIDOL LACTATE 5 MG/ML IJ SOLN: 0.5000 mg | INTRAMUSCULAR | Status: DC | PRN

## 2019-08-19 MED ORDER — LORAZEPAM 2 MG/ML IJ SOLN: 1.0000 mg | INTRAMUSCULAR | Status: DC | PRN

## 2019-08-19 MED ORDER — GLYCOPYRROLATE 1 MG PO TABS
1.0000 mg | ORAL_TABLET | ORAL | Status: DC | PRN
  Filled 2019-08-19: qty 1

## 2019-08-19 MED ORDER — HYDROMORPHONE HCL 1 MG/ML IJ SOLN
1.0000 mg | INTRAMUSCULAR | Status: DC | PRN
  Administered 2019-08-19 – 2019-08-20 (×4): 1 mg via INTRAVENOUS
  Filled 2019-08-19 (×4): qty 1

## 2019-08-19 NOTE — Progress Notes (Signed)
Palliative:  Called to bedside by nursing staff for symptom management concerns.   Patient lying in bed, lethargic, but will open eyes and have brief conversation. Jaundiced. Abdomen distended. Patient c/o pain in abdomen.   Boyfriend, father, and sister at bedside. Boyfriend asking about treatment options - we discuss futility of aggressive care at this time and he expresses understanding. Father shares desire to only focus on patient's comfort. He becomes tearful and shares that his only desire is that she not suffer.   We discussed symptom management plan - discussed meds used for pain and agitation.   We discussed pending transfer to hospice facility.  All questions and concerns addressed.   Ordered dilaudid 0.5 mg q1hr PRN - RN reported patient's pain unrelieved - dose increased to 1 mg.  Added PRN ativan, haldol, and robinul.  Patient requesting phenergan for nausea - added 25 mg q6hr PRN.   Plan: Continue full comfort measure. Pending hospice facility transfer.  Juel Burrow, DNP, AGNP-C Palliative Medicine Team Team Phone # [phone]  Pager # [phone]  25 minutes  Greater than 50%  of this time was spent counseling and coordinating care related to the above assessment and plan.

## 2019-08-19 NOTE — Progress Notes (Addendum)
[facility] Liaison note:  A hospice home bed has become available for transfer tomorrow 6/19. Family and TOC notified, family agreeable to transfer. Patient will require EMS transport and a signed out of facility DNR at discharge.  Hospice Home staff will contact the weekend [place] [facility] and arrange transfer. Please fax discharge summary to the hospice home at [phone] RN will need to call report to [phone]. Thank you. Flo Shanks BSN, RN, [facility] Liaison [phone]

## 2019-08-19 NOTE — Progress Notes (Signed)
[facility] Liaison note:  New referral for [facility] home received from [facility]. Patient information sent to referral. Hospice home eligibility pending.  Writer met in the room with patient's father Teffany Blaszczyk to initiate education regarding hospice services, philosophy, team approach to care and current visitation policy with understanding voiced. Questions answered.  [facility] is unable to offer a bed today. Family and TOC aware.  The hospice home staff will follow up with the weekend TOC if a bed becomes available over the weekend.  TOC Telford Nab and family aware. Thank you for the opportunity to be involved in the care of this patient and er family.  [facility] [employer] [phone]

## 2019-08-19 NOTE — Progress Notes (Signed)
Patient ID: Chloe [name], female   DOB: 1975-10-31, 44 y.o.   MRN: [id number]   Spoke with patient's father and family is interested in transition to IP residential hospice for EOL care.    With patient's continued decline, poor po intake, full comfort path, her prognosis is likely less than two weeks.  Will place order.  Wadie Lessen NP  Palliative Medicine Team Team Phone # [phone] Pager [phone]  No charge

## 2019-08-19 NOTE — Progress Notes (Signed)
Nutrition Brief Follow-Up Note  Chart reviewed. Patient now transitioning to comfort care.   No further nutrition interventions warranted at this time. Please re-consult RD as needed.   Kemonie Cutillo King, MS, RD, LDN Pager number available on Amion   

## 2019-08-19 NOTE — Progress Notes (Signed)
Patient ID: Chloe [name], female   DOB: 1975-04-08, 44 y.o.   MRN: [id number]  PROGRESS NOTE    Armine Ruedas  WCB:[id number] DOB: 1975/12/23 DOA: 08/09/2019 PCP: [facility]   Brief Narrative:  44 year old female with history of chronic alcoholism and liver disease presented after she was found fallen down on the floor by her father.  She was having hematemesis with coffee-ground emesis few days prior to presentation.  During the hospitalization, GI was consulted.  She was supposed to have EGD but patient became critically ill with high-grade fever and tachycardia.  She was started on IV Rocephin for E. coli UTI.  She was not having any overt bleeding during the hospitalization so EGD was postponed.  There was a concern for cecal volvulus causing abdominal distention.  She was transferred to ICU.  Subsequently she was changed to a DNR and transferred back to medical floor.  Palliative care was consulted.  Overall prognosis is guarded to poor.  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  After palliative care discussion again with the family on 08/17/2019, she was transitioned to comfort measures.  Family has subsequently decided to proceed with residential hospice.   DVT prophylaxis: Comfort measures Code Status: DNR Family Communication: None at bedside  disposition Plan: Status is: Inpatient  Remains inpatient appropriate  because:Altered mental status.  Will need residential hospice placement  Dispo: The patient is from: Home              Anticipated d/c is to: Residential hospice              Anticipated d/c date is: 1 day              Patient currently is medically stable for discharge to residential hospice   Consultants: GI/critical care/palliative care  Procedures: None  Antimicrobials:  Anti-infectives (From admission, onward)   Start     Dose/Rate Route Frequency Ordered Stop   08/17/19 1500  cephALEXin (KEFLEX) capsule 500 mg  Status:  Discontinued        500 mg Oral Every 8 hours 08/17/19 1446 08/18/19 1211   08/16/19 1400  cefTRIAXone (ROCEPHIN) 2 g in sodium chloride 0.9 % 100 mL IVPB  Status:  Discontinued        2 g 200 mL/hr over 30 Minutes Intravenous Every 24 hours 08/16/19 0929 08/17/19 1446   08/14/19 0800  vancomycin (VANCOREADY) IVPB 750 mg/150 mL  Status:  Discontinued        750 mg 150 mL/hr over 60 Minutes Intravenous Every 12 hours 08/13/19 1542 08/15/19 1355   08/13/19 1545  metroNIDAZOLE (FLAGYL) IVPB 500 mg  Status:  Discontinued        500 mg 100 mL/hr over 60 Minutes Intravenous Every 8 hours 08/13/19 1522 08/17/19 1053   08/13/19 1530  ceFEPIme (MAXIPIME) 2 g in sodium chloride 0.9 % 100 mL IVPB  Status:  Discontinued        2 g 200 mL/hr over 30 Minutes Intravenous Every 8 hours 08/13/19 1522 08/16/19 0929   08/13/19 1530  vancomycin (VANCOREADY) IVPB 1500 mg/300 mL        1,500 mg 150 mL/hr over 120 Minutes Intravenous  Once 08/13/19 1528 08/13/19 2145   08/11/19 1600  cefTRIAXone (ROCEPHIN) 2 g in sodium chloride 0.9 % 100 mL IVPB  Status:  Discontinued        2 g 200 mL/hr over 30 Minutes Intravenous Every 24 hours 08/11/19 1326 08/13/19 1522   08/10/19 1730  cefTRIAXone (ROCEPHIN) 1 g in sodium chloride 0.9 % 100 mL IVPB  Status:  Discontinued        1 g 200 mL/hr over 30 Minutes Intravenous Every 24 hours 08/09/19 2042 08/11/19 1326   08/09/19 1730  cefTRIAXone  (ROCEPHIN) 1 g in sodium chloride 0.9 % 100 mL IVPB        1 g 200 mL/hr over 30 Minutes Intravenous  Once 08/09/19 1726 08/09/19 1902       Subjective: Patient seen and examined at bedside.  Sleepy, hardly wakes up on calling her name.   Objective: Vitals:   08/18/19 1930 08/18/19 2338 08/19/19 0402 08/19/19 0812  BP: 108/70 110/69 110/74 109/72  Pulse: 82 85 83 76  Resp: 15 15 16 15   Temp: 97.8 F (36.6 C) 98 F (36.7 C) (!) 97.5 F (36.4 C) 97.9 F (36.6 C)  TempSrc:  Oral Oral   SpO2: 96% 97% 98% 99%  Weight:      Height:        Intake/Output Summary (Last 24 hours) at 08/19/2019 1020 Last data filed at 08/19/2019 0426 Gross per 24 hour  Intake --  Output 900 ml  Net -900 ml   Filed Weights   08/09/19 1618 08/10/19 2127  Weight: 57.6 kg 61.8 kg    Examination:  General exam: Appears chronically ill and older than stated age.  No distress.  Looks icteric.  Sleepy, hardly wakes up on calling her name   Data Reviewed: I have personally reviewed following labs and imaging studies  CBC: Recent Labs  Lab 08/13/19 0607 08/14/19 0432 08/15/19 0632 08/16/19 0448 08/17/19 0543  WBC 11.8* 14.5* 13.5* 13.0* 13.6*  HGB 10.4* 8.7* 8.8* 9.1* 8.6*  HCT 29.1* 25.0* 24.4* 26.4* 23.5*  MCV 93.3 96.2 94.9 95.3 92.2  PLT 85* 120* 138* 162 086*   Basic Metabolic Panel: Recent Labs  Lab 08/13/19 1202 08/14/19 0432 08/15/19 0632 08/16/19 0448 08/17/19 0543  NA 141 146* 150* 146* 144  K 4.5 3.6 3.5 3.4* 3.3*  CL 110 115* 121* 119* 117*  CO2 19* 22 23 19* 20*  GLUCOSE 147* 116* 95 97 123*  BUN 12 14 17 15 13   CREATININE 0.62 0.49 0.62 0.45 0.50  CALCIUM 7.5* 7.4* 7.4* 7.6* 7.7*  MG 1.5* 1.8 2.3 1.9 1.7  PHOS UNABLE TO REPORT DUE TO ICTERUS 1.6* UNABLE TO REPORT DUE TO ICTERUS UNABLE TO REPORT DUE TO ICTERUS UNABLE TO REPORT DUE TO ICTERUS   GFR: Estimated Creatinine Clearance: 78.4 mL/min (by C-G formula based on SCr of 0.5 mg/dL). Liver Function Tests: Recent  Labs  Lab 08/13/19 1202 08/14/19 0432 08/15/19 0632 08/16/19 0448 08/17/19 0543  AST 305* 313* 237* 204* 163*  ALT 84* 88* 79* 72* 63*  ALKPHOS 222* 251* 292* 304* 301*  BILITOT 12.9* 11.7* 12.8* 14.5* 16.1*  PROT 6.0* 5.5* 5.7* 5.7* 5.9*  ALBUMIN 2.5* 2.2* 2.2* 2.1* 2.0*   No results for input(s): LIPASE, AMYLASE in the last 168 hours. Recent Labs  Lab 08/13/19 0813 08/14/19 0432 08/15/19 5784  08/16/19 0448 08/17/19 0644  AMMONIA 54* 49* 33 44* 56*   Coagulation Profile: Recent Labs  Lab 08/12/19 1853 08/16/19 0448 08/17/19 0643  INR 1.9* 2.1* 2.1*   Cardiac Enzymes: No results for input(s): CKTOTAL, CKMB, CKMBINDEX, TROPONINI in the last 168 hours. BNP (last 3 results) No results for input(s): PROBNP in the last 8760 hours. HbA1C: No results for input(s): HGBA1C in the last 72 hours. CBG: Recent Labs  Lab 08/16/19 2319 08/17/19 0124 08/17/19 1151 08/18/19 0021 08/18/19 0414  GLUCAP 119* 113* 140* 102* 85   Lipid Profile: No results for input(s): CHOL, HDL, LDLCALC, TRIG, CHOLHDL, LDLDIRECT in the last 72 hours. Thyroid Function Tests: No results for input(s): TSH, T4TOTAL, FREET4, T3FREE, THYROIDAB in the last 72 hours. Anemia Panel: No results for input(s): VITAMINB12, FOLATE, FERRITIN, TIBC, IRON, RETICCTPCT in the last 72 hours. Sepsis Labs: No results for input(s): PROCALCITON, LATICACIDVEN in the last 168 hours.  Recent Results (from the past 240 hour(s))  Urine Culture     Status: Abnormal   Collection Time: 08/09/19  5:27 PM   Specimen: Urine, Random  Result Value Ref Range Status   Specimen Description   Final    URINE, RANDOM Performed at [facility], [address] [place], [place] [address]    Special Requests   Final    NONE Performed at [facility], [place], [place] [address]    Culture >=100,000 COLONIES/mL ESCHERICHIA COLI (A)  Final   Report Status 08/11/2019 FINAL  Final   Organism ID,  Bacteria ESCHERICHIA COLI (A)  Final      Susceptibility   Escherichia coli - MIC*    AMPICILLIN <=2 SENSITIVE Sensitive     CEFAZOLIN <=4 SENSITIVE Sensitive     CEFTRIAXONE <=1 SENSITIVE Sensitive     CIPROFLOXACIN <=0.25 SENSITIVE Sensitive     GENTAMICIN <=1 SENSITIVE Sensitive     IMIPENEM <=0.25 SENSITIVE Sensitive     NITROFURANTOIN <=16 SENSITIVE Sensitive     TRIMETH/SULFA <=20 SENSITIVE Sensitive     AMPICILLIN/SULBACTAM <=2 SENSITIVE Sensitive     PIP/TAZO <=4 SENSITIVE Sensitive     * >=100,000 COLONIES/mL ESCHERICHIA COLI  SARS Coronavirus 2 by RT PCR (hospital order, performed in [place] hospital lab) Nasopharyngeal Nasopharyngeal Swab     Status: None   Collection Time: 08/09/19  7:30 PM   Specimen: Nasopharyngeal Swab  Result Value Ref Range Status   SARS Coronavirus 2 NEGATIVE NEGATIVE Final    Comment: (NOTE) SARS-CoV-2 target nucleic acids are NOT DETECTED. The SARS-CoV-2 RNA is generally detectable in upper and lower respiratory specimens during the acute phase of infection. The lowest concentration of SARS-CoV-2 viral copies this assay can detect is 250 copies / mL. A negative result does not preclude SARS-CoV-2 infection and should not be used as the sole basis for treatment or other patient management decisions.  A negative result may occur with improper specimen collection / handling, submission of specimen other than nasopharyngeal swab, presence of viral mutation(s) within the areas targeted by this assay, and inadequate number of viral copies (<250 copies / mL). A negative result must be combined with clinical observations, patient history, and epidemiological information. Fact Sheet for Patients:   StrictlyIdeas.no Fact Sheet for Healthcare Providers: BankingDealers.co.za This test is not yet approved or cleared  by the [place] FDA and has been authorized for detection and/or diagnosis of  SARS-CoV-2 by FDA under an Emergency Use Authorization (EUA).  This EUA will remain  in effect (meaning this test can be used) for the duration of the COVID-19 declaration under Section 564(b)(1) of the Act, 21 U.S.C. section 360bbb-3(b)(1), unless the authorization is terminated or revoked sooner. Performed at [facility], [address] [place], [place] [address]   MRSA PCR Screening     Status: None   Collection Time: 08/13/19  1:09 PM   Specimen: Nasopharyngeal  Result Value Ref Range Status   MRSA by PCR NEGATIVE NEGATIVE Final    Comment:        The GeneXpert MRSA Assay (FDA approved for NASAL specimens only), is one component of a comprehensive MRSA colonization surveillance program. It is not intended to diagnose MRSA infection nor to guide or monitor treatment for MRSA infections. Performed at [facility], [address] [place], [place] [address]          Radiology Studies: No results found.      Scheduled Meds: . Chlorhexidine Gluconate Cloth  6 each Topical Daily  . sodium chloride flush  10-40 mL Intracatheter Q12H   Continuous Infusions:         Aline August, MD [facility] 08/19/2019, 10:20 AM

## 2019-08-19 NOTE — Progress Notes (Signed)
Patient had bowel movement. It is noted that while her stool appeared to contain no blood, there was a scant amount of blood which came from her hemorrhoids. Cleaned and dried peri area thoroughly.

## 2019-08-20 NOTE — Progress Notes (Signed)
Vital signs Bp 109/65 pulse 78 resp.16 temp 98.6

## 2019-08-20 NOTE — Plan of Care (Signed)
  Problem: Education: Goal: Knowledge of General Education information will improve Description: Including pain rating scale, medication(s)/side effects and non-pharmacologic comfort measures Outcome: Adequate for Discharge   Problem: Health Behavior/Discharge Planning: Goal: Ability to manage health-related needs will improve Outcome: Adequate for Discharge   Problem: Clinical Measurements: Goal: Ability to maintain clinical measurements within normal limits will improve Outcome: Adequate for Discharge Goal: Will remain free from infection Outcome: Adequate for Discharge Goal: Diagnostic test results will improve Outcome: Adequate for Discharge Goal: Respiratory complications will improve Outcome: Adequate for Discharge Goal: Cardiovascular complication will be avoided Outcome: Adequate for Discharge   Problem: Activity: Goal: Risk for activity intolerance will decrease Outcome: Adequate for Discharge   Problem: Activity: Goal: Risk for activity intolerance will decrease Outcome: Adequate for Discharge   Problem: Activity: Goal: Risk for activity intolerance will decrease Outcome: Adequate for Discharge   Problem: Nutrition: Goal: Adequate nutrition will be maintained Outcome: Adequate for Discharge   Problem: Coping: Goal: Level of anxiety will decrease Outcome: Adequate for Discharge   Problem: Elimination: Goal: Will not experience complications related to bowel motility Outcome: Adequate for Discharge Goal: Will not experience complications related to urinary retention Outcome: Adequate for Discharge   Problem: Pain Managment: Goal: General experience of comfort will improve Outcome: Adequate for Discharge   Problem: Safety: Goal: Ability to remain free from injury will improve Outcome: Adequate for Discharge   Problem: Skin Integrity: Goal: Risk for impaired skin integrity will decrease Outcome: Adequate for Discharge

## 2019-08-20 NOTE — TOC Transition Note (Signed)
Transition of Care [facility]) - CM/SW Discharge Note   Patient Details  Name: Chloe [name] MRN: [id number] Date of Birth: 08/25/1975  [facility]) CM/SW Contact:  Meriel Flavors, LCSW Phone Number: 08/20/2019, 7:52 AM   Clinical Narrative:    6/19:Patient will discharge today to Hospice home . Pt will need DNR signed, EMS transport and family notified.      Barriers to Discharge: Continued Medical Work up   Patient Goals and CMS Choice Patient states their goals for this hospitalization and ongoing recovery are:: Get home-family requesting help with ETOH abuse      Discharge Placement                       Discharge Plan and Services                                     Social Determinants of Health (SDOH) Interventions     Readmission Risk Interventions No flowsheet data found.

## 2019-08-20 NOTE — Progress Notes (Signed)
Updated report given to Shawna Clamp RN at hospice-waiting on [facility] to arrange discharge for today.

## 2019-08-20 NOTE — Progress Notes (Signed)
Patient is transported to [facility] , Family accompanied patient  .

## 2019-08-20 NOTE — TOC Transition Note (Signed)
Transition of Care [facility]) - CM/SW Discharge Note   Patient Details  Name: Chloe [name] MRN: [id number] Date of Birth: 01-29-1976  [facility]) CM/SW Contact:  Boris Sharper, LCSW Phone Number: 08/20/2019, 10:37 AM   Clinical Narrative:    Pt medically stable to discharge per MD. Pt will be transported via [facility] to [facility]. CSW notified pt's father of discharge. CSW contacted Debbie with [facility] to verify discharge. CSW faxed discharge summary to [facility].    Final next level of care: [place] Barriers to Discharge: No Barriers Identified   Patient Goals and CMS Choice Patient states their goals for this hospitalization and ongoing recovery are:: Get home-family requesting help with ETOH abuse      Discharge Placement              Patient chooses bed at: Other - please specify in the comment section below: [facility]) Patient to be transferred to facility by: EMS Name of family member notified: Lenny Pastel Patient and family notified of of transfer: 08/20/19  Discharge Plan and Services                                     Social Determinants of Health (SDOH) Interventions     Readmission Risk Interventions No flowsheet data found.

## 2019-08-20 NOTE — Progress Notes (Signed)
EMS called for transport.

## 2019-08-20 NOTE — Discharge Summary (Signed)
Physician Discharge Summary  Chloe [name] HER:[id number] DOB: 1975/12/18 DOA: 08/09/2019  PCP: [facility], [place] date: 08/09/2019 Discharge date: 08/20/2019  Admitted From: Home Disposition: Residential hospice  Recommendations for Outpatient Follow-up:  Follow up with residential hospice at earliest [place]: No Equipment/Devices: None  Discharge Condition: Poor CODE STATUS: DNR Diet recommendation: Per comfort measures  Brief/Interim Summary: 44 year old female with history of chronic alcoholism and liver disease presented after she was found fallen down on the floor by her father.  She was having hematemesis with coffee-ground emesis few days prior to presentation.  During the hospitalization, GI was consulted.  She was supposed to have EGD but patient became critically ill with high-grade fever and tachycardia.  She was started on IV Rocephin for E. coli UTI.  She was not having any overt bleeding during the hospitalization so EGD was postponed.  There was a concern for cecal volvulus causing abdominal distention.  She was transferred to ICU.  Subsequently she was changed to a DNR and transferred back to medical floor.  Palliative care was consulted.  Overall prognosis is guarded to poor.  She was transitioned to comfort measures on 08/17/2019.  She will be discharged to residential hospice once bed is available.   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  After palliative care discussion again with the family on 08/17/2019, she was transitioned to comfort measures.  Family has subsequently decided to proceed with residential hospice. - She will be discharged to residential hospice once bed is available.    Discharge Instructions   Allergies as of 08/20/2019      Reactions   Bee Venom    Shortness of breath   Sumatriptan Anaphylaxis      Medication List    STOP taking these medications   DULoxetine 20 MG capsule Commonly known as: CYMBALTA   folic acid 1 MG tablet Commonly known as: FOLVITE   furosemide 40 MG tablet Commonly known as: LASIX   gabapentin 300 MG capsule Commonly known as: NEURONTIN   levothyroxine 75 MCG tablet Commonly known as: SYNTHROID   ondansetron 4 MG tablet Commonly known as: ZOFRAN   pantoprazole 40 MG tablet Commonly known as: PROTONIX   prenatal vitamin w/FE, FA 27-1 MG Tabs tablet   spironolactone 25 MG tablet Commonly known as: ALDACTONE       Allergies  Allergen Reactions  . Bee Venom     Shortness of breath  . Sumatriptan Anaphylaxis    Consultations: GI/critical care/palliative care     Procedures/Studies: DG Wrist Complete Right  Result Date: 08/09/2019 CLINICAL DATA:  Fall with pain and bruising. EXAM: RIGHT WRIST - COMPLETE 3+ VIEW COMPARISON:  None. FINDINGS: There is no evidence of fracture or dislocation. There is no evidence of arthropathy or other focal bone abnormality. Soft tissues are unremarkable. IMPRESSION: Negative. Electronically Signed   By: Nelson Chimes M.D.   On: 08/09/2019 17:08   DG Abd 1 View  Result Date: 08/15/2019 CLINICAL DATA:  Small bowel obstruction. EXAM: ABDOMEN - 1 VIEW COMPARISON:  08/13/2019 FINDINGS: An enteric tube remains in place with  tip projecting over the gastric body. Overall, the degree of bowel distension has decreased from the prior study. There is persistent moderate gaseous distension of the transverse colon with  milder gaseous distension of bowel loops elsewhere which may reflect a combination of small and large bowel. No acute osseous abnormality is seen. IMPRESSION: Decreased bowel distension. Electronically Signed   By: Logan Bores M.D.   On: 08/15/2019 12:37   DG Abd 1 View  Result Date: 08/13/2019 CLINICAL DATA:  Nasogastric tube placement EXAM: ABDOMEN - 1 VIEW COMPARISON:  CT abdomen and pelvis August 10, 2019 FINDINGS: Nasogastric tube tip and side port are in the stomach. Loops of bowel dilatation remain. Note that the cecum is now distended and oriented perpendicular to the lumbar spine, a change from recent CT. No free air. IMPRESSION: Nasogastric tube tip and side port in stomach. Persistent bowel dilatation. Note that there is now dilatation of the cecum with cecum now perpendicular to the lumbar spine. Question early cecal volvulus. This finding may warrant correlation with abdominal CT to further evaluate. No free air. These results will be called to the ordering clinician or representative by the Radiologist Assistant, and communication documented in the PACS or [employer]. Electronically Signed   By: Lowella Grip III M.D.   On: 08/13/2019 13:41   CT ABDOMEN PELVIS W CONTRAST  Result Date: 08/15/2019 CLINICAL DATA:  Abdominal distension EXAM: CT ABDOMEN AND PELVIS WITH CONTRAST TECHNIQUE: Multidetector CT imaging of the abdomen and pelvis was performed using the standard protocol following bolus administration of intravenous contrast. CONTRAST:  2mL OMNIPAQUE IOHEXOL 300 MG/ML  SOLN COMPARISON:  08/10/2018 FINDINGS: Lower chest: Bibasilar atelectasis, right greater than left, which has slightly increased from prior. Trace pericardial effusion. Hepatobiliary: Marked hepatic steatosis. Nodular hepatic surface contour. Heterogeneous appearance of the liver parenchyma, similar to prior. Moderate distention of the gallbladder, which appears mildly thick-walled. No hyperdense gallstone evident.  Pancreas: Unremarkable. No pancreatic ductal dilatation or surrounding inflammatory changes. Spleen: Spleen is mildly enlarged, unchanged from prior. Adrenals/Urinary Tract: Unremarkable adrenal glands. Subcentimeter cortically based low-density lesion in the lower pole the right kidney, too small to characterize. Right greater than left striated nephrograms, most evident on delayed phase imaging (for example series 7, image 21). Urinary bladder is mildly thick walled although under distended. Stomach/Bowel: Interval development of diffuse pancolonic wall thickening, most pronounced within the right colon and cecum. Numerous fluid-filled loops of small bowel throughout the abdomen. The stomach and proximal small bowel is distended with oral contrast. No evidence of bowel obstruction. An enteric tube terminates within the stomach. Vascular/Lymphatic: Extensive upper abdominal and paraesophageal varices. Normal caliber aorta without aneurysm. No abdominopelvic lymphadenopathy identified. Reproductive: Uterus and bilateral adnexa are unremarkable. Other: Interval development of small volume ascites throughout the abdomen and pelvis. No well-defined or rim enhancing fluid collections. No pneumoperitoneum. Mild anasarca. Musculoskeletal: No acute or significant osseous findings. IMPRESSION: 1. Interval development of diffuse pancolonic wall thickening, most pronounced within the right colon and cecum. There also mildly thickened fluid-filled loops of small bowel throughout the abdomen. Findings are suggestive of an enterocolitis, which may be infectious or inflammatory in etiology. No evidence of bowel obstruction. 2. Interval development of small volume ascites throughout the abdomen and pelvis. 3. Again noted are bilateral striated nephrograms, right greater than left, most evident on delayed phase imaging, suggesting pyelonephritis. Correlate with urinalysis. 4. Moderately distended gallbladder with suggestion of mild  diffuse gallbladder wall thickening. Findings are nonspecific in the setting of concurrent  hepatocellular disease. If there is clinical concern for cholecystitis, a right upper quadrant ultrasound could be performed. 5. Marked hepatic steatosis with findings of cirrhosis and portal hypertension. Heterogeneous appearance of the liver parenchyma, similar to prior study. 6. Bibasilar atelectasis, right greater than left, which has slightly increased from prior. 7. Trace pericardial effusion. Electronically Signed   By: Davina Poke D.O.   On: 08/15/2019 15:55   CT ABDOMEN PELVIS W CONTRAST  Result Date: 08/10/2019 CLINICAL DATA:  Abdominal swelling, ascites suspected EXAM: CT ABDOMEN AND PELVIS WITH CONTRAST TECHNIQUE: Multidetector CT imaging of the abdomen and pelvis was performed using the standard protocol following bolus administration of intravenous contrast. CONTRAST:  183mL OMNIPAQUE IOHEXOL 300 MG/ML  SOLN COMPARISON:  None FINDINGS: Lower chest: Atelectatic changes in the lung bases, right slightly greater than left. Included lungs are otherwise clear. Mild cardiomegaly with right atrial enlargement. Slight compression of the right heart by a pectus deformity of the chest (Haller index 3.7). Bilateral breast prostheses with minimal capsular calcification. Hepatobiliary: Diffuse hepatic hypoattenuation with some sparing along the gallbladder fossa which could reflect some diffuse fatty infiltration. A a markedly nodular hepatic surface contour is present as well with more global heterogeneity likely denoting numerous regenerative nodules and stigmata of cirrhosis. No focal concerning liver lesion is identified. There is moderate gallbladder distention with few dependently layering calcified gallstones and some mild gallbladder wall thickening, which is nonspecific in the setting of intrinsic liver disease. Pancreas: Unremarkable. No pancreatic ductal dilatation or surrounding inflammatory changes.  Spleen: Borderline splenomegaly.  No concerning splenic lesions. Adrenals/Urinary Tract: Normal adrenal glands. Subcentimeter hypodense focus in the lower pole right kidney too small to fully characterize on CT imaging but statistically likely benign. There is asymmetric right perinephric stranding with a diffusely striated right nephrogram including more heterogeneous attenuation in the right lower pole. Features are much more conspicuous on the delayed phase imaging where slight striations of the left nephrogram is noted as well. There is mild bilateral hydroureteronephrosis to the level of the urinary bladder but without obstructing urolithiasis. Mild urothelial thickening noted as well. Few punctate nonobstructing calculi are present in the upper pole left kidney and lower pole right kidney. There is moderate bladder distention but without gross bladder wall thickening. Stomach/Bowel: There is a small sliding-type hiatal hernia. Extensive paraesophageal venous collateralization and varix formation as well as additional varices and collaterals along the lesser curvature of the proximal stomach. Mild gastric wall thickening is nonspecific. Duodenum takes a normal course across the midline abdomen. Much of the small bowel is fluid-filled but nondistended. Slightly mobile cecum directed towards the left upper quadrant. Normal air-filled appendix without inflammation. Moderate to large colonic stool burden. No colonic dilatation or wall thickening. Vascular/Lymphatic: Paraesophageal and gastric varices. Additional upper abdominal collaterals including engorgement of the splenic vein. Recanalization of the umbilical vein. Portal and superior mesenteric veins are normally opacified. Hepatic veins normally opacified. No aortic aneurysm or ectasia. Some edematous central lymph nodes. No pathologically enlarged nodes in the abdomen or pelvis. Reproductive: Anteverted uterus.  No concerning adnexal lesions. Other: Small  volume ascites predominantly in the subphrenic spaces some central edematous mesenteric changes as well. No bowel containing hernia. No free abdominopelvic air, organized collection or abscess. Musculoskeletal: Few ill-defined lucencies in the and ilia likely reflect marrow changes in the absence of known malignancy. No acute or worrisome osseous lesions. Multilevel degenerative changes are present in the imaged portions of the spine. No acute osseous abnormality or suspicious osseous lesion.  IMPRESSION: 1. Features of acute bilateral pyelonephritis, right more pronounced than left, with concomitant mild bilateral hydroureteronephrosis and urothelial thickening. Correlate with urinalysis. 2. Stigmata of cirrhosis with sequela of portal hypertension including splenomegaly, extensive paraesophageal and gastric varices, recanalized umbilical vein and additional upper abdominal collaterals as well as small volume ascites and splenomegaly. 3. Cholelithiasis with mild gallbladder wall thickening with distension. Wall thickening is nonspecific in the setting of intrinsic liver disease however the distension may be the initial presentation of cholecystitis. If there is clinical concern, consider further evaluation with right upper quadrant ultrasound. 4. Moderate to large colonic stool burden. Correlate for features of constipation. 5. Few ill-defined lucencies ilia likely reflect marrow changes in the absence of known malignancy. Correlate with history. 6. Mild cardiomegaly with right atrial enlargement. 7. Slight compression of the right heart by a pectus deformity of the chest (Haller index 3.7). 8. Aortic Atherosclerosis (ICD10-I70.0). Electronically Signed   By: Lovena Le M.D.   On: 08/10/2019 05:54   US Abdomen Limited  Result Date: 08/15/2019 CLINICAL DATA:  History of cirrhosis. Please perform ascites search ultrasound ultrasound-guided paracentesis as indicated. EXAM: LIMITED ABDOMEN ULTRASOUND FOR ASCITES  TECHNIQUE: Limited ultrasound survey for ascites was performed in all four abdominal quadrants. COMPARISON:  CT abdomen and pelvis-08/10/2019 FINDINGS: Sonographic evaluation of the abdomen demonstrates a trace amount of perihepatic (image 2) and free fluid within the left lower abdominal quadrant (image 6), too small to allow for safe ultrasound-guided paracentesis. As such, no paracentesis attempted. IMPRESSION: Trace amount of intra-abdominal ascites, too small to allow for safe ultrasound-guided paracentesis. Electronically Signed   By: Sandi Mariscal M.D.   On: 08/15/2019 11:49       Subjective: Patient seen and examined at bedside.  Looks very sleepy, wakes up on calling her name.  Looks comfortable.  Discharge Exam: Vitals:   08/19/19 0812 08/20/19 0435  BP: 109/72 110/68  Pulse: 76 89  Resp: 15 15  Temp: 97.9 F (36.6 C) 97.7 F (36.5 C)  SpO2: 99% 98%    General exam: Appears chronically ill and older than stated age.  No distress.  Looks icteric.  Sleepy, hardly wakes up on calling her name  Cardiovascular: rate controlled, S1/S2 + Respiratory: bilateral decreased breath sounds at bases with scattered crackles   The results of significant diagnostics from this hospitalization (including imaging, microbiology, ancillary and laboratory) are listed below for reference.     Microbiology: Recent Results (from the past 240 hour(s))  MRSA PCR Screening     Status: None   Collection Time: 08/13/19  1:09 PM   Specimen: Nasopharyngeal  Result Value Ref Range Status   MRSA by PCR NEGATIVE NEGATIVE Final    Comment:        The GeneXpert MRSA Assay (FDA approved for NASAL specimens only), is one component of a comprehensive MRSA colonization surveillance program. It is not intended to diagnose MRSA infection nor to guide or monitor treatment for MRSA infections. Performed at [facility], [address] [place], [place] [address]      Labs: BNP (last 3  results) No results for input(s): BNP in the last 8760 hours. Basic Metabolic Panel: Recent Labs  Lab 08/13/19 1202 08/14/19 0432 08/15/19 0632 08/16/19 0448 08/17/19 0543  NA 141 146* 150* 146* 144  K 4.5 3.6 3.5 3.4* 3.3*  CL 110 115* 121* 119* 117*  CO2 19* 22 23 19* 20*  GLUCOSE 147* 116* 95 97 123*  BUN 12 14 17  15  13  CREATININE 0.62 0.49 0.62 0.45 0.50  CALCIUM 7.5* 7.4* 7.4* 7.6* 7.7*  MG 1.5* 1.8 2.3 1.9 1.7  PHOS UNABLE TO REPORT DUE TO ICTERUS 1.6* UNABLE TO REPORT DUE TO ICTERUS UNABLE TO REPORT DUE TO ICTERUS UNABLE TO REPORT DUE TO ICTERUS   Liver Function Tests: Recent Labs  Lab 08/13/19 1202 08/14/19 0432 08/15/19 0632 08/16/19 0448 08/17/19 0543  AST 305* 313* 237* 204* 163*  ALT 84* 88* 79* 72* 63*  ALKPHOS 222* 251* 292* 304* 301*  BILITOT 12.9* 11.7* 12.8* 14.5* 16.1*  PROT 6.0* 5.5* 5.7* 5.7* 5.9*  ALBUMIN 2.5* 2.2* 2.2* 2.1* 2.0*   No results for input(s): LIPASE, AMYLASE in the last 168 hours. Recent Labs  Lab 08/14/19 0432 08/15/19 0632 08/16/19 0448 08/17/19 0644  AMMONIA 49* 33 44* 56*   CBC: Recent Labs  Lab 08/14/19 0432 08/15/19 0632 08/16/19 0448 08/17/19 0543  WBC 14.5* 13.5* 13.0* 13.6*  HGB 8.7* 8.8* 9.1* 8.6*  HCT 25.0* 24.4* 26.4* 23.5*  MCV 96.2 94.9 95.3 92.2  PLT 120* 138* 162 139*   Cardiac Enzymes: No results for input(s): CKTOTAL, CKMB, CKMBINDEX, TROPONINI in the last 168 hours. BNP: Invalid input(s): POCBNP CBG: Recent Labs  Lab 08/16/19 2319 08/17/19 0124 08/17/19 1151 08/18/19 0021 08/18/19 0414  GLUCAP 119* 113* 140* 102* 85   D-Dimer No results for input(s): DDIMER in the last 72 hours. Hgb A1c No results for input(s): HGBA1C in the last 72 hours. Lipid Profile No results for input(s): CHOL, HDL, LDLCALC, TRIG, CHOLHDL, LDLDIRECT in the last 72 hours. Thyroid function studies No results for input(s): TSH, T4TOTAL, T3FREE, THYROIDAB in the last 72 hours.  Invalid input(s): FREET3 Anemia  work up No results for input(s): VITAMINB12, FOLATE, FERRITIN, TIBC, IRON, RETICCTPCT in the last 72 hours. Urinalysis    Component Value Date/Time   COLORURINE AMBER (A) 08/09/2019 1644   APPEARANCEUR CLOUDY (A) 08/09/2019 1644   LABSPEC 1.015 08/09/2019 1644   PHURINE 5.0 08/09/2019 1644   GLUCOSEU NEGATIVE 08/09/2019 1644   HGBUR LARGE (A) 08/09/2019 1644   BILIRUBINUR SMALL (A) 08/09/2019 1644   KETONESUR NEGATIVE 08/09/2019 1644   PROTEINUR 100 (A) 08/09/2019 1644   NITRITE NEGATIVE 08/09/2019 1644   LEUKOCYTESUR LARGE (A) 08/09/2019 1644   Sepsis Labs Invalid input(s): PROCALCITONIN,  WBC,  LACTICIDVEN Microbiology Recent Results (from the past 240 hour(s))  MRSA PCR Screening     Status: None   Collection Time: 08/13/19  1:09 PM   Specimen: Nasopharyngeal  Result Value Ref Range Status   MRSA by PCR NEGATIVE NEGATIVE Final    Comment:        The GeneXpert MRSA Assay (FDA approved for NASAL specimens only), is one component of a comprehensive MRSA colonization surveillance program. It is not intended to diagnose MRSA infection nor to guide or monitor treatment for MRSA infections. Performed at [facility], [address] [place], [place] [address]      Time coordinating discharge: 35 minutes  SIGNED:   Aline August, MD  [facility] 08/20/2019, 8:18 AM

## 2019-09-01 DEATH — deceased

## 2019-10-31 MED FILL — Levothyroxine Sodium Tab 50 MCG: ORAL | Qty: 50 | Status: AC

## 2019-10-31 MED FILL — Levothyroxine Sodium Tab 25 MCG: ORAL | Qty: 25 | Status: AC

## 2019-10-31 MED FILL — Cephalexin Cap 500 MG: ORAL | Qty: 500 | Status: AC
